# Patient Record
Sex: Male | Born: 1935 | Race: Black or African American | Hispanic: No | Marital: Married | Smoking: Former smoker
Health system: Southern US, Community
[De-identification: ages and names within clinical notes are randomized; demographics above are authoritative.]

## PROBLEM LIST (undated history)

## (undated) DIAGNOSIS — K222 Esophageal obstruction: Secondary | ICD-10-CM

## (undated) DIAGNOSIS — N182 Chronic kidney disease, stage 2 (mild): Secondary | ICD-10-CM

## (undated) DIAGNOSIS — J189 Pneumonia, unspecified organism: Secondary | ICD-10-CM

## (undated) DIAGNOSIS — R7301 Impaired fasting glucose: Secondary | ICD-10-CM

## (undated) DIAGNOSIS — I509 Heart failure, unspecified: Secondary | ICD-10-CM

## (undated) DIAGNOSIS — I739 Peripheral vascular disease, unspecified: Secondary | ICD-10-CM

## (undated) DIAGNOSIS — N400 Enlarged prostate without lower urinary tract symptoms: Secondary | ICD-10-CM

## (undated) DIAGNOSIS — E785 Hyperlipidemia, unspecified: Secondary | ICD-10-CM

## (undated) DIAGNOSIS — K219 Gastro-esophageal reflux disease without esophagitis: Secondary | ICD-10-CM

## (undated) DIAGNOSIS — I779 Disorder of arteries and arterioles, unspecified: Secondary | ICD-10-CM

## (undated) DIAGNOSIS — R9430 Abnormal result of cardiovascular function study, unspecified: Secondary | ICD-10-CM

## (undated) DIAGNOSIS — I10 Essential (primary) hypertension: Secondary | ICD-10-CM

## (undated) DIAGNOSIS — I214 Non-ST elevation (NSTEMI) myocardial infarction: Secondary | ICD-10-CM

## (undated) HISTORY — DX: Hyperlipidemia, unspecified: E78.5

## (undated) HISTORY — PX: OTHER SURGICAL HISTORY: SHX169

## (undated) HISTORY — DX: Heart failure, unspecified: I50.9

## (undated) HISTORY — DX: Impaired fasting glucose: R73.01

## (undated) HISTORY — PX: ESOPHAGEAL DILATION: SHX303

## (undated) HISTORY — DX: Gastro-esophageal reflux disease without esophagitis: K21.9

## (undated) HISTORY — DX: Pneumonia, unspecified organism: J18.9

## (undated) HISTORY — DX: Peripheral vascular disease, unspecified: I73.9

## (undated) HISTORY — DX: Chronic kidney disease, stage 2 (mild): N18.2

## (undated) HISTORY — DX: Abnormal result of cardiovascular function study, unspecified: R94.30

## (undated) HISTORY — PX: TONSILLECTOMY: SUR1361

## (undated) HISTORY — DX: Disorder of arteries and arterioles, unspecified: I77.9

## (undated) HISTORY — DX: Essential (primary) hypertension: I10

## (undated) HISTORY — DX: Benign prostatic hyperplasia without lower urinary tract symptoms: N40.0

## (undated) HISTORY — DX: Esophageal obstruction: K22.2

## (undated) HISTORY — PX: CAROTID ENDARTERECTOMY: SUR193

---

## 2005-02-14 HISTORY — PX: HERNIA REPAIR: SHX51

## 2006-02-14 HISTORY — PX: CATARACT EXTRACTION: SUR2

## 2006-11-24 ENCOUNTER — Emergency Department (HOSPITAL_COMMUNITY): Admission: EM | Admit: 2006-11-24 | Discharge: 2006-11-24 | Payer: Self-pay | Admitting: Family Medicine

## 2006-12-07 ENCOUNTER — Emergency Department (HOSPITAL_COMMUNITY): Admission: EM | Admit: 2006-12-07 | Discharge: 2006-12-07 | Payer: Self-pay | Admitting: Emergency Medicine

## 2007-02-02 ENCOUNTER — Encounter: Payer: Self-pay | Admitting: Family Medicine

## 2007-02-02 ENCOUNTER — Inpatient Hospital Stay (HOSPITAL_COMMUNITY): Admission: AD | Admit: 2007-02-02 | Discharge: 2007-02-02 | Payer: Self-pay | Admitting: Orthopedic Surgery

## 2007-04-26 ENCOUNTER — Emergency Department (HOSPITAL_COMMUNITY): Admission: EM | Admit: 2007-04-26 | Discharge: 2007-04-26 | Payer: Self-pay | Admitting: Family Medicine

## 2007-05-23 ENCOUNTER — Encounter: Admission: RE | Admit: 2007-05-23 | Discharge: 2007-05-23 | Payer: Self-pay | Admitting: Internal Medicine

## 2007-05-31 ENCOUNTER — Ambulatory Visit (HOSPITAL_COMMUNITY): Admission: RE | Admit: 2007-05-31 | Discharge: 2007-05-31 | Payer: Self-pay | Admitting: Internal Medicine

## 2008-04-11 ENCOUNTER — Encounter: Payer: Self-pay | Admitting: Cardiology

## 2008-09-13 ENCOUNTER — Encounter: Payer: Self-pay | Admitting: Cardiology

## 2009-03-13 ENCOUNTER — Encounter: Payer: Self-pay | Admitting: Cardiology

## 2009-04-07 ENCOUNTER — Inpatient Hospital Stay (HOSPITAL_COMMUNITY): Admission: EM | Admit: 2009-04-07 | Discharge: 2009-04-15 | Payer: Self-pay | Admitting: Emergency Medicine

## 2009-04-07 ENCOUNTER — Ambulatory Visit: Payer: Self-pay | Admitting: Cardiology

## 2009-04-07 ENCOUNTER — Ambulatory Visit: Payer: Self-pay | Admitting: Internal Medicine

## 2009-04-07 DIAGNOSIS — I509 Heart failure, unspecified: Secondary | ICD-10-CM

## 2009-04-07 HISTORY — DX: Heart failure, unspecified: I50.9

## 2009-04-07 LAB — CONVERTED CEMR LAB
Hgb A1c MFr Bld: 5.9 %
TSH: 1.122 microintl units/mL

## 2009-04-08 ENCOUNTER — Encounter: Payer: Self-pay | Admitting: Internal Medicine

## 2009-04-14 ENCOUNTER — Encounter (INDEPENDENT_AMBULATORY_CARE_PROVIDER_SITE_OTHER): Payer: Self-pay | Admitting: *Deleted

## 2009-04-14 DIAGNOSIS — D638 Anemia in other chronic diseases classified elsewhere: Secondary | ICD-10-CM | POA: Insufficient documentation

## 2009-04-15 ENCOUNTER — Encounter: Payer: Self-pay | Admitting: Internal Medicine

## 2009-04-15 DIAGNOSIS — K222 Esophageal obstruction: Secondary | ICD-10-CM | POA: Insufficient documentation

## 2009-04-28 ENCOUNTER — Ambulatory Visit: Payer: Self-pay | Admitting: Gastroenterology

## 2009-04-30 ENCOUNTER — Ambulatory Visit: Payer: Self-pay | Admitting: Internal Medicine

## 2009-04-30 ENCOUNTER — Encounter: Payer: Self-pay | Admitting: Internal Medicine

## 2009-04-30 DIAGNOSIS — N183 Chronic kidney disease, stage 3 (moderate): Secondary | ICD-10-CM

## 2009-04-30 LAB — CONVERTED CEMR LAB
BUN: 49 mg/dL
BUN: 28 mg/dL — ABNORMAL HIGH (ref 6–23)
Basophils Absolute: 0.1 10*3/uL (ref 0.0–0.1)
CO2: 24 meq/L
Chloride: 101 meq/L (ref 96–112)
Creatinine, Ser: 1.29 mg/dL (ref 0.40–1.50)
Eosinophils Relative: 3 % (ref 0–5)
Glucose, Bld: 101 mg/dL
HCT: 30 % — ABNORMAL LOW (ref 39.0–52.0)
HDL: 88 mg/dL
Lymphocytes Relative: 30 % (ref 12–46)
Lymphs Abs: 1.6 10*3/uL (ref 0.7–4.0)
Neutro Abs: 3 10*3/uL (ref 1.7–7.7)
Neutrophils Relative %: 56 % (ref 43–77)
Platelets: 306 10*3/uL (ref 150–400)
Potassium: 4 meq/L
Potassium: 5.3 meq/L (ref 3.5–5.3)
RDW: 13.6 % (ref 11.5–15.5)
Sodium: 140 meq/L
Total CHOL/HDL Ratio: 1.8
WBC: 5.4 10*3/uL (ref 4.0–10.5)

## 2009-05-05 ENCOUNTER — Encounter: Payer: Self-pay | Admitting: Internal Medicine

## 2009-05-12 ENCOUNTER — Ambulatory Visit: Payer: Self-pay | Admitting: Gastroenterology

## 2009-05-12 ENCOUNTER — Ambulatory Visit (HOSPITAL_COMMUNITY): Admission: RE | Admit: 2009-05-12 | Discharge: 2009-05-12 | Payer: Self-pay | Admitting: Gastroenterology

## 2009-05-14 ENCOUNTER — Ambulatory Visit: Payer: Self-pay | Admitting: Internal Medicine

## 2009-05-14 LAB — CONVERTED CEMR LAB

## 2009-05-15 LAB — CONVERTED CEMR LAB
CO2: 29 meq/L (ref 19–32)
Chloride: 100 meq/L (ref 96–112)
HCT: 29.3 % — ABNORMAL LOW (ref 39.0–52.0)
Lymphocytes Relative: 30 % (ref 12–46)
Lymphs Abs: 2 10*3/uL (ref 0.7–4.0)
Monocytes Relative: 8 % (ref 3–12)
Neutrophils Relative %: 60 % (ref 43–77)
Platelets: 189 10*3/uL (ref 150–400)
Potassium: 4.6 meq/L (ref 3.5–5.3)
RBC: 3.05 M/uL — ABNORMAL LOW (ref 4.22–5.81)
WBC: 6.6 10*3/uL (ref 4.0–10.5)

## 2009-05-30 ENCOUNTER — Encounter: Payer: Self-pay | Admitting: Cardiology

## 2009-06-01 ENCOUNTER — Ambulatory Visit: Payer: Self-pay | Admitting: Cardiology

## 2009-06-04 ENCOUNTER — Telehealth (INDEPENDENT_AMBULATORY_CARE_PROVIDER_SITE_OTHER): Payer: Self-pay | Admitting: *Deleted

## 2009-06-08 ENCOUNTER — Ambulatory Visit: Payer: Self-pay | Admitting: Cardiology

## 2009-06-08 ENCOUNTER — Encounter (HOSPITAL_COMMUNITY): Admission: RE | Admit: 2009-06-08 | Discharge: 2009-08-18 | Payer: Self-pay | Admitting: Cardiology

## 2009-06-08 ENCOUNTER — Ambulatory Visit: Payer: Self-pay

## 2009-06-12 ENCOUNTER — Telehealth: Payer: Self-pay | Admitting: Cardiology

## 2009-06-22 ENCOUNTER — Ambulatory Visit: Payer: Self-pay | Admitting: Gastroenterology

## 2009-06-23 ENCOUNTER — Telehealth: Payer: Self-pay | Admitting: Gastroenterology

## 2009-06-30 ENCOUNTER — Ambulatory Visit (HOSPITAL_COMMUNITY)
Admission: RE | Admit: 2009-06-30 | Discharge: 2009-06-30 | Payer: Self-pay | Source: Home / Self Care | Admitting: Gastroenterology

## 2009-07-03 ENCOUNTER — Ambulatory Visit (HOSPITAL_COMMUNITY)
Admission: RE | Admit: 2009-07-03 | Discharge: 2009-07-03 | Payer: Self-pay | Source: Home / Self Care | Admitting: Gastroenterology

## 2009-07-03 ENCOUNTER — Ambulatory Visit: Payer: Self-pay | Admitting: Gastroenterology

## 2009-07-10 ENCOUNTER — Ambulatory Visit: Payer: Self-pay | Admitting: Cardiology

## 2009-07-14 ENCOUNTER — Telehealth: Payer: Self-pay | Admitting: Cardiology

## 2009-07-15 ENCOUNTER — Ambulatory Visit: Payer: Self-pay | Admitting: Internal Medicine

## 2009-07-15 DIAGNOSIS — R7301 Impaired fasting glucose: Secondary | ICD-10-CM | POA: Insufficient documentation

## 2009-07-15 DIAGNOSIS — N400 Enlarged prostate without lower urinary tract symptoms: Secondary | ICD-10-CM

## 2009-07-15 DIAGNOSIS — M109 Gout, unspecified: Secondary | ICD-10-CM

## 2009-07-16 LAB — CONVERTED CEMR LAB
ALT: 9 units/L (ref 0–53)
Albumin: 3.9 g/dL (ref 3.5–5.2)
BUN: 27 mg/dL — ABNORMAL HIGH (ref 6–23)
Basophils Absolute: 0 10*3/uL (ref 0.0–0.1)
Cholesterol: 164 mg/dL (ref 0–200)
GFR calc non Af Amer: 85.74 mL/min (ref >60)
HDL: 54 mg/dL (ref >39.00)
Hemoglobin: 9.7 g/dL — ABNORMAL LOW (ref 13.0–17.0)
Hgb A1c MFr Bld: 5.8 % (ref 4.6–6.5)
Lymphocytes Relative: 34.9 % (ref 12.0–46.0)
Monocytes Relative: 9.4 % (ref 3.0–12.0)
Neutro Abs: 2.6 10*3/uL (ref 1.4–7.7)
Phosphorus: 4.2 mg/dL (ref 2.3–4.6)
Potassium: 5 meq/L (ref 3.5–5.1)
RBC: 2.93 M/uL — ABNORMAL LOW (ref 4.22–5.81)
RDW: 14.3 % (ref 11.5–14.6)
Sodium: 140 meq/L (ref 135–145)
TSH: 0.92 microintl units/mL (ref 0.35–5.50)
Total Protein: 6.9 g/dL (ref 6.0–8.3)
Triglycerides: 194 mg/dL — ABNORMAL HIGH (ref 0.0–149.0)
VLDL: 38.8 mg/dL (ref 0.0–40.0)
WBC: 4.9 10*3/uL (ref 4.5–10.5)

## 2009-09-03 ENCOUNTER — Encounter: Payer: Self-pay | Admitting: Cardiology

## 2009-09-03 ENCOUNTER — Ambulatory Visit: Payer: Self-pay | Admitting: Vascular Surgery

## 2009-12-18 ENCOUNTER — Encounter: Payer: Self-pay | Admitting: Cardiology

## 2009-12-21 ENCOUNTER — Ambulatory Visit: Payer: Self-pay | Admitting: Cardiology

## 2010-01-19 ENCOUNTER — Ambulatory Visit: Payer: Self-pay | Admitting: Internal Medicine

## 2010-01-21 LAB — CONVERTED CEMR LAB
Albumin: 4.3 g/dL (ref 3.5–5.2)
Basophils Relative: 0.7 % (ref 0.0–3.0)
Chloride: 103 meq/L (ref 96–112)
GFR calc non Af Amer: 65.34 mL/min (ref >60.00)
Hemoglobin: 10.5 g/dL — ABNORMAL LOW (ref 13.0–17.0)
Lymphocytes Relative: 37.4 % (ref 12.0–46.0)
Monocytes Relative: 9.1 % (ref 3.0–12.0)
Neutro Abs: 2.7 10*3/uL (ref 1.4–7.7)
Phosphorus: 3.9 mg/dL (ref 2.3–4.6)
Potassium: 4.9 meq/L (ref 3.5–5.1)
RBC: 3.34 M/uL — ABNORMAL LOW (ref 4.22–5.81)

## 2010-03-03 ENCOUNTER — Ambulatory Visit: Admit: 2010-03-03 | Payer: Self-pay | Admitting: Vascular Surgery

## 2010-03-03 ENCOUNTER — Ambulatory Visit
Admission: RE | Admit: 2010-03-03 | Discharge: 2010-03-03 | Payer: Self-pay | Source: Home / Self Care | Attending: Vascular Surgery | Admitting: Vascular Surgery

## 2010-03-08 ENCOUNTER — Encounter: Payer: Self-pay | Admitting: Internal Medicine

## 2010-03-09 ENCOUNTER — Inpatient Hospital Stay (HOSPITAL_COMMUNITY)
Admission: RE | Admit: 2010-03-09 | Discharge: 2010-03-10 | Payer: Self-pay | Source: Home / Self Care | Attending: Vascular Surgery | Admitting: Vascular Surgery

## 2010-03-09 ENCOUNTER — Encounter: Payer: Self-pay | Admitting: Vascular Surgery

## 2010-03-09 LAB — TYPE AND SCREEN: ABO/RH(D): O POS

## 2010-03-09 LAB — CBC
HCT: 30.9 % — ABNORMAL LOW (ref 39.0–52.0)
MCHC: 33 g/dL (ref 30.0–36.0)
Platelets: 179 10*3/uL (ref 150–400)
RDW: 13.7 % (ref 11.5–15.5)

## 2010-03-09 LAB — COMPREHENSIVE METABOLIC PANEL
Albumin: 4.1 g/dL (ref 3.5–5.2)
CO2: 30 mEq/L (ref 19–32)
Creatinine, Ser: 1.37 mg/dL (ref 0.4–1.5)
Glucose, Bld: 99 mg/dL (ref 70–99)
Sodium: 140 mEq/L (ref 135–145)
Total Bilirubin: 0.6 mg/dL (ref 0.3–1.2)
Total Protein: 7.1 g/dL (ref 6.0–8.3)

## 2010-03-09 LAB — URINALYSIS, ROUTINE W REFLEX MICROSCOPIC
Bilirubin Urine: NEGATIVE
Ketones, ur: NEGATIVE mg/dL
Nitrite: NEGATIVE
Protein, ur: NEGATIVE mg/dL
pH: 6 (ref 5.0–8.0)

## 2010-03-09 LAB — PROTIME-INR: INR: 1.18 (ref 0.00–1.49)

## 2010-03-09 LAB — SURGICAL PCR SCREEN: MRSA, PCR: POSITIVE — AB

## 2010-03-10 LAB — GLUCOSE, CAPILLARY: Glucose-Capillary: 151 mg/dL — ABNORMAL HIGH (ref 70–99)

## 2010-03-10 LAB — CBC
HCT: 23.9 % — ABNORMAL LOW (ref 39.0–52.0)
Hemoglobin: 7.9 g/dL — ABNORMAL LOW (ref 13.0–17.0)
MCH: 30 pg (ref 26.0–34.0)
MCHC: 33.1 g/dL (ref 30.0–36.0)
MCV: 90.9 fL (ref 78.0–100.0)
Platelets: 127 K/uL — ABNORMAL LOW (ref 150–400)
RBC: 2.63 MIL/uL — ABNORMAL LOW (ref 4.22–5.81)
RDW: 13.9 % (ref 11.5–15.5)
WBC: 5.9 K/uL (ref 4.0–10.5)

## 2010-03-10 LAB — BASIC METABOLIC PANEL WITH GFR
BUN: 23 mg/dL (ref 6–23)
CO2: 26 meq/L (ref 19–32)
Calcium: 8.7 mg/dL (ref 8.4–10.5)
Chloride: 104 meq/L (ref 96–112)
Creatinine, Ser: 1.32 mg/dL (ref 0.4–1.5)
GFR calc non Af Amer: 52 mL/min — ABNORMAL LOW
Glucose, Bld: 122 mg/dL — ABNORMAL HIGH (ref 70–99)
Potassium: 4.4 meq/L (ref 3.5–5.1)
Sodium: 137 meq/L (ref 135–145)

## 2010-03-11 NOTE — Op Note (Addendum)
NAME:  KAIKOA, MAGRO NO.:  [id number]  MEDICAL RECORD NO.:  [id number]          PATIENT TYPE:  INP  LOCATION:  2899                         FACILITY:  [facility]  PHYSICIAN:  Di Kindle. Edilia Bo, M.D.DATE OF BIRTH:  [birth year]  DATE OF PROCEDURE:  03/09/2010 DATE OF DISCHARGE:                              OPERATIVE REPORT   PREOPERATIVE DIAGNOSIS:  Greater than 80% left carotid stenosis, asymptomatic.  POSTOPERATIVE DIAGNOSIS:  Greater than 80% left carotid stenosis, asymptomatic.  PROCEDURE:  Left carotid endarterectomy with Dacron patch angioplasty.  SURGEON:  Di Kindle. Edilia Bo, M.D.  ASSISTANT:  Della Goo, PA-C.  ANESTHESIA:  General.  INDICATIONS:  This is an 77 year old gentleman whom I have been following with a moderate left carotid stenosis and a known right internal carotid artery occlusion.  The carotid stenosis on the left progressed to greater than 80%, and left carotid endarterectomy was recommended in order to lower his risk of future stroke.  FINDINGS:  90% stenosis with focal calcific plaque.  TECHNIQUE:  The patient was taken to the operating room and received a general anesthetic.  Arterial line had been placed by Anesthesia.  The left neck was prepped and draped in usual sterile fashion.  Of note, he had very limited neck mobility, and it was difficult to turn his head. The incision was made along the anterior border of the sternocleidomastoid and dissection carried down to the common carotid artery which was dissected free and controlled with Rumel tourniquet. Facial vein was divided between 2-0 silk ties, and then the internal carotid artery was controlled above the plaque and the external carotid artery was controlled with a blue vessel loop.  The patient was then heparinized.  Clamps were then placed on the internal, then the external, then the common carotid artery.  A longitudinal arteriotomy was made in the common  carotid artery, and this was extended through the plaque into the internal carotid artery above the plaque.  A 12-shunt was placed into the internal carotid artery, back bled and then placed in the common carotid artery and secured with Rumel tourniquet.  Flow was reestablished with a shunt.  An endarterectomy plane was established proximally, and the plaque was sharply divided.  Eversion endarterectomy was performed of the external carotid artery.  Distally, there was a nice taper in the plaque and no tacking sutures were required.  The artery was irrigated with copious amounts of heparin and dextran, and all loose debris was removed.  The Dacron patch was then sewn using continuous 6-0 Prolene suture.  Prior to completing the patch closure, the shunt was removed.  The artery was back bled and flushed appropriately.  The anastomosis was completed and flow reestablished, first to the external carotid artery and into the internal carotid artery.  At the completion, there was a good Doppler signal distal to the patch and a good flow.  Hemostasis was obtained in the wound.  The wound was closed with a deep layer of 3-0 Vicryl.  The heparin was partially reversed with protamine. The platysma was closed with running 3-0 Vicryl.  The skin was closed with a  4-0 subcuticular stitch.  Sterile dressing was applied.  The patient tolerated the procedure well and was transferred to the recovery room in stable condition.  All needle and sponge counts were correct.     Di Kindle. Edilia Bo, M.D.     CSD/MEDQ  D:  03/09/2010  T:  03/10/2010  Job:  [id number]  cc:   Luis Abed, MD, [facility]  Electronically Signed by Waverly Ferrari M.D. on 03/11/2010 04:43:33 PM

## 2010-03-11 NOTE — Discharge Summary (Addendum)
NAME:  SELIG, Anthony Werner NO.:  [id number]  MEDICAL RECORD NO.:  [id number]          PATIENT TYPE:  INP  LOCATION:  3301                         FACILITY:  [facility]  PHYSICIAN:  Di Kindle. Edilia Bo, M.D.DATE OF BIRTH:  10-11-1924  DATE OF ADMISSION:  03/09/2010 DATE OF DISCHARGE:  03/10/2010                              DISCHARGE SUMMARY   ADMIT DIAGNOSIS:  Asymptomatic left internal carotid artery stenosis.  PAST MEDICAL HISTORY AND DISCHARGE DIAGNOSES: 1. Left internal carotid artery stenosis, status post left carotid     endarterectomy. 2. Hypertension. 3. Hypercholesterolemia. 4. Congestive heart failure. 5. Status post bilateral inguinal hernia repair. 6. Esophageal dilatation.  ALLERGIES: 1. ATENOLOL. 2. DILTIAZEM. 3. IRON. 4. HYDROCHLOROTHIAZIDE. 5. LABETALOL. 6. NIACIN. 7. TERAZOSIN. 8. STATIN.  BRIEF HISTORY:  The patient is an 77 year old male who has been followed by Dr. Edilia Bo as an outpatient for known carotid stenosis.  On his most recent study on March 03, 2010, the left carotid stenosis had progressed to greater than 80%.  Therefore, left carotid endarterectomy was recommended to reduce stroke risk.  HOSPITAL COURSE:  The patient was admitted and taken to the OR on March 09, 2010, for left carotid endarterectomy with Dacron patch angioplasty.  The patient tolerated the procedure well and was  hemodynamically stable immediately postoperatively.  The patient was transferred from the OR to postanesthesia care unit in stable condition. The patient was extubated without complication and woke up from anesthesia neurologically intact.  The patient's postoperative course has progressed as expected.  On postoperative day 1, he is without complaint.  He is ambulating without difficulty and tolerating a regular diet.  He is afebrile with stable vital signs.  PHYSICAL EXAMINATION:  CARDIAC:  Regular rate and rhythm. LUNGS:  Clear to  auscultation.  His incision is clean, dry and intact and he is neurologically intact.    The patient is doing well.  He does have an acute blood loss anemia, which is asymptomatic,  but we will needto be followed up as an outpatient.  As long as the patient is able to void without difficulty, he will be ready for discharge home today.  LABORATORY DATA:  CBC and BMP on March 10, 2010, white count 5.9, hemoglobin 7.9, hematocrit 23.9, and platelets 127.  Sodium 137, potassium 4.4, BUN 23, and creatinine 1.32.  DISCHARGE MEDICATIONS: 1. Percocet 5/325 mg 1-2 every 4-6 hours p.r.n. pain. 2. Aspirin 81 mg daily. 3. Carvedilol 6.25 mg daily. 4. Furosemide 40 mg daily. 5. Lisinopril 5 mg daily. 6. Omeprazole 20 mg b.i.d. 7. Plavix 75 mg daily. 8. Pravachol 40 mg daily. 9. Vitamin C 500 mg daily. 10.Vitamin D 2-4 units daily. 11.Vitamin B complex OTC daily.  DISCHARGE INSTRUCTIONS:  The patient received specific written discharge instructions including that he may shower starting tomorrow, clean the incision with soap  and water and keep dry.  He should not lift anything over 10 pounds for 6 weeks and no driving for 2 weeks.  If the patient has any difficulty swallowing or speaking, or weakness in his extremities that is new, or severe headache he should contact  the office.  If he has increased swelling in the neck and/or difficulty breathing he should call 911.  The patient has a followup appointment with Dr. Edilia Bo in 2 weeks.  The patient will be contacted with the date and time of that appointment.  Any questions should be directed to our office at [phone].     Pecola Leisure, PA   ______________________________ Di Kindle. Edilia Bo, M.D.    AY/MEDQ  D:  03/10/2010  T:  03/11/2010  Job:  [id number]  Electronically Signed by Pecola Leisure PA on 03/11/2010 01:38:50 PM Electronically Signed by Waverly Ferrari M.D. on 03/11/2010 04:44:39 PM

## 2010-03-12 NOTE — Procedures (Unsigned)
CAROTID DUPLEX EXAM  INDICATION:  Follow up carotid artery disease, known right ICA occlusion.  HISTORY: Diabetes:  No. Cardiac:  No. Hypertension:  Yes. Smoking:  Previously. Previous Surgery:  No. CV History:  Asymptomatic. Amaurosis Fugax No, Paresthesias No, Hemiparesis No.                                      RIGHT             LEFT Brachial systolic pressure: Brachial Doppler waveforms: Vertebral direction of flow:                          Antegrade DUPLEX VELOCITIES (cm/sec) CCA peak systolic                                     95 ECA peak systolic                                     222 ICA peak systolic                                     458 ICA end diastolic                                     118 PLAQUE MORPHOLOGY:                                    Mixed PLAQUE AMOUNT:                                        Severe PLAQUE LOCATION:                                      ICA, bifurcation, ECA  IMPRESSION: 1. Right internal carotid artery known occlusion. 2. Left internal carotid artery velocities suggest 80% to 99%     stenosis.  ___________________________________________ Di Kindle. Edilia Bo, M.D.  EM/MEDQ  D:  03/03/2010  T:  03/03/2010  Job:  [id number]

## 2010-03-16 NOTE — Assessment & Plan Note (Signed)
Summary: Cardiology Nuclear Study  Nuclear Med Background Indications for Stress Test: Evaluation for Ischemia, Post Hospital  Indications Comments: 04/07/09 [facility]: DOE, CHF; Pneumonia and increase Troponins  History: Echo, Emphysema, Heart Catheterization  History Comments: '75 Cath:([place]) OK per patient; 2/11 Echo:EF=45%;  Symptoms: Chest Tightness, Chest Tightness with Exertion, DOE, Palpitations, Rapid HR  Symptoms Comments: Last episode of WG:NFAO week.   Nuclear Pre-Procedure Cardiac Risk Factors: Carotid Disease, Family History - CAD, History of Smoking, Hypertension, Lipids Caffeine/Decaff Intake: None NPO After: 6:00 PM Lungs: Clear.  O2 Sat 98% on RA. IV 0.9% NS with Angio Cath: 20g     IV Site: (R) AC IV Started by: Irean Hong RN Chest Size (in) 40     Height (in): 67 Weight (lb): 127 BMI: 19.96 Tech Comments: Carvedilol held x 24 hours.  Nuclear Med Study 1 or 2 day study:  1 day     Stress Test Type:  Eugenie Birks Reading MD:  Olga Millers, MD     Referring MD:  Willa Rough, MD Resting Radionuclide:  Technetium 43m Tetrofosmin     Resting Radionuclide Dose:  11.0 mCi  Stress Radionuclide:  Technetium 47m Tetrofosmin     Stress Radionuclide Dose:  33.0 mCi   Stress Protocol   Lexiscan: 0.4 mg   Stress Test Technologist:  Rea College CMA-N     Nuclear Technologist:  Domenic Polite CNMT  Rest Procedure  Myocardial perfusion imaging was performed at rest 45 minutes following the intravenous administration of Myoview Technetium 37m Tetrofosmin.  Stress Procedure  The patient received IV Lexiscan 0.4 mg over 15-seconds.  Myoview injected at 30-seconds.  There were no significant changes with lexiscan; occasional PAC's, rare PVC's.  Quantitative spect images were obtained after a 45 minute delay.  QPS Raw Data Images:  There is interference from nuclear activity from structures below the diaphragm.  This does not affect the ability to read the  study. Stress Images:  There is decreased uptake in the inferior wall. Rest Images:  There is decreased uptake in the inferior wall. Subtraction (SDS):  No evidence of ischemia. Transient Ischemic Dilatation:  1.0  (Normal <1.22)  Lung/Heart Ratio:  .32  (Normal <0.45)  Quantitative Gated Spect Images QGS EDV:  98 ml QGS ESV:  33 ml QGS EF:  66 % QGS cine images:  Normal wall motion.   Overall Impression  Exercise Capacity: Lexiscan study with no exercise. BP Response: Normal blood pressure response. Clinical Symptoms: No chest pain ECG Impression: No significant ST segment change suggestive of ischemia. Overall Impression: There is inferior thinning but no sign of scar or ischemia.  Appended Document: Cardiology Nuclear Study This looks good  Appended Document: Cardiology Nuclear Study Left message to call back   Appended Document: Cardiology Nuclear Study pts wife aware

## 2010-03-16 NOTE — Assessment & Plan Note (Signed)
Summary: 2WK F/U/DEVANI/VS   Vital Signs:  Patient profile:   77 year old male Height:      67 inches (170.18 cm) Weight:      127.1 pounds (57.45 kg) BMI:     19.98 Temp:     97.5 degrees F (36.39 degrees C) oral Pulse rate:   60 / minute BP sitting:   136 / 56  (right arm) Cuff size:   regular  Vitals Entered By: Theotis Barrio NT II (May 14, 2009 9:33 AM) CC: PATIENT IS HERE FOR 2 WEEK FOLLOW UP APPT  / MEDICATIONS  REFILL - 90 DAY SUPPLY Is Patient Diabetic? No Pain Assessment Patient in pain? no      Nutritional Status BMI of 19 -24 = normal  Have you ever been in a relationship where you felt threatened, hurt or afraid?No   Does patient need assistance? Functional Status Self care Ambulation Normal Comments MEDICATIONS REFILL - 90 DAY SUPPLY   Primary Care Provider:  [facility]  CC:  PATIENT IS HERE FOR 2 WEEK FOLLOW UP APPT  / MEDICATIONS  REFILL - 90 DAY SUPPLY.  History of Present Illness: In here for follow up and med refills. Mr. Belcher was seen two weeks ago and was noted to be anemic (likely chronic). Had a colonoscopy with in the past 3 year or so at [place] that was "normal". Had an EGD last week, found to have esoph stricture. No other complaints.   Preventive Screening-Counseling & Management  Alcohol-Tobacco     Smoking Status: quit  Caffeine-Diet-Exercise     Does Patient Exercise: yes     Type of exercise: TRED MILL     Exercise (avg: min/session): 15-20 MIN     Times/week: 3  Current Medications (verified): 1)  Omeprazole 20 Mg Cpdr (Omeprazole) .... Take 1 Tablet By Mouth Once A Day 2)  Plavix 75 Mg Tabs (Clopidogrel Bisulfate) .... Take 1 Tablet By Mouth Once A Day 3)  Furosemide 40 Mg Tabs (Furosemide) .... Take 1 Tablet By Mouth Once A Day 4)  Aspir-Low 81 Mg Tbec (Aspirin) .... Take 1 Tablet By Mouth Once A Day 5)  Lisinopril 5 Mg Tabs (Lisinopril) .... Take 1 Tablet By Mouth Once A Day 6)  Pravastatin Sodium 40 Mg Tabs (Pravastatin  Sodium) .... Take 1 Tablet By Mouth Once A Day 7)  Carvedilol 6.25 Mg Tabs (Carvedilol) .... Take 1 Tablet By Mouth Two Times A Day  Allergies (verified): 1)  ! Atenolol 2)  ! * Diltiazem 3)  ! Iron 4)  ! Hydrochlorothiazide 5)  ! * Labetalol 6)  ! Niacin 7)  ! * Omeprazole 8)  ! * Terazosin 9)  ! * Feladipine 10)  ! * Flurastatin 11)  ! Lovastatin 12)  ! Simvastatin  Past History:  Past Medical History: Last updated: 04/23/2009 Community-acquired pneumonia Hypertension Hyperlipidemia benign prostatic hyperplasia anemia of chronic disease CHF Esophageal stricture Stage III  chronic disease  Past Surgical History: Last updated: 04/28/2009 Cataract Extraction Hernia Surgery Tonsillectomy  Family History: Last updated: 04/28/2009 Family History of Prostate Cancer:Father Family History of Heart Disease:Brothers x2  No FH of Colon Cancer:  Social History: Last updated: 04/28/2009 Married  Tobacco Use - Former. -- 28 Occupation: Retired Doctor, hospital Alcohol Use - no Illicit Drug Use - no  Risk Factors: Exercise: yes (05/14/2009)  Risk Factors: Smoking Status: quit (05/14/2009)  Social History: Does Patient Exercise:  yes  Review of Systems      See  HPI  Physical Exam  General:  alert and well-developed.   Eyes:  pupils round and pupils reactive to light.   Mouth:  pharynx pink and moist.   Neck:  supple.   Lungs:  normal respiratory effort and normal breath sounds.   Heart:  normal rate and regular rhythm.   Abdomen:  soft and non-tender.   Pulses:  normal peripheral pulses  Extremities:  no cyanosis, clubbing or edema  Neurologic:  non focal.   Impression & Recommendations:  Problem # 1:  ESSENTIAL HYPERTENSION, BENIGN (ICD-401.1) BP borderline, will increase lisinopril to 5 mg daily. Check b-met today.   His updated medication list for this problem includes:    Furosemide 40 Mg Tabs (Furosemide) .Marland Kitchen... Take 1 tablet by mouth once a day     Lisinopril 5 Mg Tabs (Lisinopril) .Marland Kitchen... Take 1 tablet by mouth once a day    Carvedilol 6.25 Mg Tabs (Carvedilol) .Marland Kitchen... Take 1 tablet by mouth two times a day  Orders: T-Basic Metabolic Panel [id number]) T-CBC w/Diff [phone])  Problem # 2:  ANEMIA OF OTHER CHRONIC DISEASE (ICD-285.29) This could be chronic, but will need to review colonoscopy results from [place]. I will also get a ferritin today.   Orders: T-Basic Metabolic Panel [id number]) T-CBC w/Diff [phone] [id number]) T-Ferritin [phone])  Problem # 3:  HYPERLIPIDEMIA (ICD-272.4) Well-controlled. Continue the current regimen.  Chol: 160 (04/30/2009 1:37:40 PM)HDL:  88 (04/30/2009 1:37:40 PM)LDL:  63 (04/30/2009 1:37:40 PM)Tri:   AST:   ALT:  T. Bili:   AP:     His updated medication list for this problem includes:    Pravastatin Sodium 40 Mg Tabs (Pravastatin sodium) .Marland Kitchen... Take 1 tablet by mouth once a day  Complete Medication List: 1)  Omeprazole 20 Mg Cpdr (Omeprazole) .... Take 1 tablet by mouth once a day 2)  Plavix 75 Mg Tabs (Clopidogrel bisulfate) .... Take 1 tablet by mouth once a day 3)  Furosemide 40 Mg Tabs (Furosemide) .... Take 1 tablet by mouth once a day 4)  Aspir-low 81 Mg Tbec (Aspirin) .... Take 1 tablet by mouth once a day 5)  Lisinopril 5 Mg Tabs (Lisinopril) .... Take 1 tablet by mouth once a day 6)  Pravastatin Sodium 40 Mg Tabs (Pravastatin sodium) .... Take 1 tablet by mouth once a day 7)  Carvedilol 6.25 Mg Tabs (Carvedilol) .... Take 1 tablet by mouth two times a day  Patient Instructions: 1)  Please schedule a follow-up appointment in 1-2  month. 2)  We will let you know if anything wrong with your lab work.   Prescriptions: CARVEDILOL 6.25 MG TABS (CARVEDILOL) Take 1 tablet by mouth two times a day  #90 x 3   Entered and Authorized by:   Zara Council MD   Signed by:   Zara Council MD on 05/14/2009   Method used:   Electronically to        [facility] [phone]* (retail)        [address]       [place], [place]  [address]       Ph: [phone]       Fax: [phone]   RxID:   [id number] PRAVASTATIN SODIUM 40 MG TABS (PRAVASTATIN SODIUM) Take 1 tablet by mouth once a day  #90 x 3   Entered and Authorized by:   Zara Council MD   Signed by:   Zara Council MD on 05/14/2009   Method used:   Electronically to  [facility] [address] [phone]* (retail)       [address]       [place], [place]  [address]       Ph: [phone]       Fax: [phone]   RxID:   [id number] LISINOPRIL 5 MG TABS (LISINOPRIL) Take 1 tablet by mouth once a day  #90 x 3   Entered and Authorized by:   Zara Council MD   Signed by:   Zara Council MD on 05/14/2009   Method used:   Electronically to        [facility] [phone]* (retail)       [address]       [place], [place]  [address]       Ph: [phone]       Fax: [phone]   RxID:   [id number] FUROSEMIDE 40 MG TABS (FUROSEMIDE) Take 1 tablet by mouth once a day  #90 x 3   Entered and Authorized by:   Zara Council MD   Signed by:   Zara Council MD on 05/14/2009   Method used:   Electronically to        [facility] [phone]* (retail)       [address]       [place], [place]  [address]       Ph: [phone]       Fax: [phone]   RxID:   [id number] PLAVIX 75 MG TABS (CLOPIDOGREL BISULFATE) Take 1 tablet by mouth once a day  #90 x 3   Entered and Authorized by:   Zara Council MD   Signed by:   Zara Council MD on 05/14/2009   Method used:   Electronically to        [facility] [phone]* (retail)       [address]       [place], [place]  [address]       Ph: [phone]       Fax: [phone]   RxID:   [id number] OMEPRAZOLE 20 MG CPDR (OMEPRAZOLE) Take 1 tablet by mouth once a day  #90 x 3   Entered and Authorized by:   Zara Council MD   Signed by:   Zara Council MD on 05/14/2009   Method used:   Electronically to        [id number]* (retail)        [address]       [place], [place]  [address]       Ph: [phone]       Fax: [phone]   RxID:   [id number]   Prevention & Chronic Care Immunizations   Influenza vaccine: Not documented   Influenza vaccine deferral: Deferred  (05/14/2009)   Influenza vaccine due: 10/16/2010    Tetanus booster: Not documented   Td booster deferral: Deferred  (04/30/2009)    Pneumococcal vaccine: Pneumovax  (04/30/2009)    H. zoster vaccine: Not documented   H. zoster vaccine deferral: Refused  (04/30/2009)  Colorectal Screening   Hemoccult: Not documented   Hemoccult action/deferral: Deferred  (04/30/2009)    Colonoscopy: Not documented   Colonoscopy action/deferral: Deferred  (05/14/2009)  Other Screening   PSA: Not documented   PSA action/deferral: Discussion deferred  (05/14/2009)   Smoking status: quit  (05/14/2009)  Lipids   Total Cholesterol: 160  (04/30/2009)   Lipid panel action/deferral: Deferred   LDL: 63  (04/30/2009)   LDL Direct: Not documented  HDL: 88  (04/30/2009)   Triglycerides: 44  (04/30/2009)    SGOT (AST): Not documented   BMP action: Deferred   SGPT (ALT): Not documented   Alkaline phosphatase: Not documented   Total bilirubin: Not documented    Lipid flowsheet reviewed?: Yes   Progress toward LDL goal: At goal  Hypertension   Last Blood Pressure: 136 / 56  (05/14/2009)   Serum creatinine: 1.29  (04/30/2009)   Serum potassium 5.3  (04/30/2009)    Hypertension flowsheet reviewed?: Yes   Progress toward BP goal: Unchanged  Self-Management Support :   Personal Goals (by the next clinic visit) :      Personal blood pressure goal: 130/80  (04/30/2009)     Personal LDL goal: 100  (04/30/2009)    Patient will work on the following items until the next clinic visit to reach self-care goals:     Medications and monitoring: take my medicines every day, bring all of my medications to every visit  (05/14/2009)     Eating: drink diet soda or water  instead of juice or soda, eat more vegetables, use fresh or frozen vegetables, eat foods that are low in salt, eat baked foods instead of fried foods, eat fruit for snacks and desserts, limit or avoid alcohol  (05/14/2009)     Activity: take a 30 minute walk every day  (05/14/2009)    Hypertension self-management support: Written self-care plan  (04/30/2009)    Lipid self-management support: Written self-care plan  (04/30/2009)   Process Orders Check Orders Results:     [facility]: Check successful Tests Sent for requisitioning (May 14, 2009 10:04 AM):     05/14/2009: [facility] -- T-Basic Metabolic Panel [phone] (signed)     05/14/2009: [facility] -- T-CBC w/Diff [[id number]] (signed)     05/14/2009: [facility] -- T-Ferritin [phone] (signed)

## 2010-03-16 NOTE — Progress Notes (Signed)
Summary: pt still dose not have refills  Phone Note Call from Patient Call back at Home Phone [phone]   Caller: Spouse Reason for Call: Refill Medication, Talk to Nurse, Talk to Doctor Summary of Call: They have checked with the Pharm about refills and was told we didn't send them in please call patient and let her know what you find out Initial call taken by: Omer Jack,  Jul 14, 2009 10:05 AM  Follow-up for Phone Call        spoke w/pts wife needs [facility] locally [facility], rx called in [place], RN  Jul 14, 2009 10:49 AM

## 2010-03-16 NOTE — Assessment & Plan Note (Signed)
Summary: per check out/sf   Visit Type:  Follow-up Referring Provider:  Doneen Poisson, MD Primary Provider:  [facility]  CC:  chest pain.  History of Present Illness: The patient is seen for followup of chest pain, CHF, left ventricular dysfunction, carotid artery disease.  He has an ejection fraction of 45%.  During recent hospitalization he had had some slight troponin elevation with CHF.  This occurred also in the setting of a pneumonia.  I saw him in the office on June 01 2009.  He was stable at that time.  Was felt we should be sure that he was not having any marked ischemia after his hospitalization with elevated troponin.  Myoview scan was done dated June 08, 2009.  Study showed no significant ischemia and no significant scar.  Patient returns today for followup.  I have asked him to obtain records from the [place].  He brought approximately 50 pages for review.  I have reviewed many of the records but I will review further after this visit.  Current Medications (verified): 1)  Omeprazole 20 Mg Cpdr (Omeprazole) .... Take 1 Tablet By Mouth Once A Day 2)  Plavix 75 Mg Tabs (Clopidogrel Bisulfate) .... Take 1 Tablet By Mouth Once A Day 3)  Furosemide 40 Mg Tabs (Furosemide) .... Take 1 Tablet By Mouth Once A Day 4)  Aspir-Low 81 Mg Tbec (Aspirin) .... Take 1 Tablet By Mouth Once A Day 5)  Lisinopril 5 Mg Tabs (Lisinopril) .... Take 1 Tablet By Mouth Once A Day 6)  Pravastatin Sodium 40 Mg Tabs (Pravastatin Sodium) .... Take 1 Tablet By Mouth Once A Day 7)  Carvedilol 6.25 Mg Tabs (Carvedilol) .... Take 1 Tablet By Mouth Two Times A Day 8)  Vitamin C 500 Mg  Tabs (Ascorbic Acid) .... Once Daily 9)  Vitamin B Complex-C   Caps (B Complex-C) .... Once Daily 10)  Vitamin D 400 Unit  Tabs (Cholecalciferol) .... Once Daily  Allergies: 1)  ! Atenolol 2)  ! * Diltiazem 3)  ! Iron 4)  ! Hydrochlorothiazide 5)  ! * Labetalol 6)  ! Niacin 7)  ! * Terazosin 8)  ! * Fleodapine 9)  ! *  Fluvastatin 10)  ! Lovastatin 11)  ! Simvastatin  Past History:  Past Medical History: Last updated: 06/01/2009 Community-acquired pneumonia   hospital ....04/07/2009 Hypertension Hyperlipidemia benign prostatic hyperplasia anemia of chronic disease   Hgb 8...transfusion hospital 04/2009 CHF.Marland Kitchenwhile in hospital 04/07/2009 EF  45%...echo...04/08/2009 Esophageal stricture....dilitation at [facility] ,..over the years CKD  II carotid artery disease total right carotid, 50% left carotid by report from the patient  Review of Systems       Patient denies fever, chills, headache, sweats, rash, change in vision, change in hearing, chest pain, cough, nausea vomiting, urinary symptoms.  All other systems are reviewed and are negative.  Vital Signs:  Patient profile:   77 year old male Height:      67 inches Weight:      132.50 pounds BMI:     20.83 Pulse rate:   60 / minute Pulse rhythm:   regular Resp:     18 per minute BP sitting:   148 / 60  (left arm) Cuff size:   regular  Vitals Entered By: Vikki Ports (Jul 10, 2009 9:47 AM)  Physical Exam  General:  patient is stable today. Head:  head is atraumatic. Eyes:  no xanthelasma. Neck:  bilateral carotid bruits. Chest Wall:  no chest wall tenderness. Lungs:  lungs are clear.  Respiratory effort is not labored. Heart:  cardiac exam reveals S1 and S2.  There is a soft systolic murmur. Abdomen:  abdomen is soft. Msk:  no musculoskeletal deformities. Extremities:  no peripheral edema. Skin:  no skin rashes. Psych:  patient is oriented to person time and place.  Affect is normal.  He is here with his wife today.   Impression & Recommendations:  Problem # 1:  CHEST DISCOMFORT (ICD-786.59) The patient has not had any further chest discomfort.  Nuclear scan reveals no ischemia.  No further workup.  Problem # 2:  CAROTID ARTERY DISEASE (ICD-433.10)  The patient has severe carotid artery disease.  I have reviewed his most recent Doppler  and the report of a CT angiogram that had been done within the past year.  He has total occlusion of the right carotid.  There is significant disease of the left carotid.  The patient receives all of his care now here in [place].  I will review the records further and then help him arrange a visit with vascular surgery so that they can follow him with me.  He is stable at this time. I have reviewed approximately 100 pages of outside records.  I have selected several pages to copy and send to vascular surgery.  The patient has known total occlusion of the right carotid artery.  He has 50-70% stenosis of the left carotid artery.  There is a note from April 10, 2008 that appears to be an admission note.  I believe that the patient was tentatively scheduled for carotid endarterectomy depending on the results of the CT angiogram.  This study was done April 11 2008.  There is a report of several pages.  It is my understanding that shows complete occlusion of the right internal carotid.  There is reconstitution of the right supraclinoid internal carotid artery area there is approximate 60% stenosis of the left internal carotid artery.  It is my assumption that based on this result the patient was then followed clinically. A recent Doppler report from January 2 011 head is read as again showing total occlusion of the right carotid with greater than 70% stenosis of the left carotid.  It is my understanding that the patient was to be followed clinically based on this.  The patient is stable.  He lives in the area now and I will refer him for vascular evaluation so that he can be followed here.  A copy of this note along with copies of selected reports will be sent to vascular surgery.  Orders: VVSG Referral (VVSG Ref)  Problem # 3:  * EF  45% The patient is clinically stable and he is on appropriate medications.  No change in meds today.  Problem # 4:  ESSENTIAL HYPERTENSION, BENIGN (ICD-401.1) Blood  pressure is upper normal for his age.  I will not change his meds today.  I will plan to see him back in 6 months.  I will be arranging his vascular surgery consultation.  Patient Instructions: 1)  You have been referred to Dr Edilia Bo 2)  Your physician wants you to follow-up in: 6 months.  You will receive a reminder letter in the mail two months in advance. If you don't receive a letter, please call our office to schedule the follow-up appointment.

## 2010-03-16 NOTE — Letter (Signed)
Summary: Results Letter  [facility]  [address] [place], [place] [address]   Phone: [phone]  Fax: [phone]        April 28, 2009 MRN: [id number]    Anthony Werner [address] , [place]  [address]    Dear Anthony Werner,  It is my pleasure to have treated you recently as a new patient in my office. I appreciate your confidence and the opportunity to participate in your care.  Since I do have a busy inpatient endoscopy schedule and office schedule, my office hours vary weekly. I am, however, available for emergency calls everyday through my office. If I am not available for an urgent office appointment, another one of our gastroenterologist will be able to assist you.  My well-trained staff are prepared to help you at all times. For emergencies after office hours, a physician from our Gastroenterology section is always available through my 24 hour answering service  Once again I welcome you as a new patient and I look forward to a happy and healthy relationship             Sincerely,  Louis Meckel MD  This letter has been electronically signed by your physician.  Appended Document: Results Letter letter mailed

## 2010-03-16 NOTE — Progress Notes (Signed)
Summary: Nuclear Pre-Procedure  Phone Note Outgoing Call   Call placed by: Milana Na, EMT-P,  June 04, 2009 1:31 PM Summary of Call: .[email or web address]     Nuclear Med Background Indications for Stress Test: Evaluation for Ischemia, Post Hospital  Indications Comments: 04/07/09 [facility] DOE CHF Pneumonia and increase Troponins  History: Echo  History Comments: 02/11 ECHO EF 45%  Symptoms: Chest Tightness with Exertion, DOE    Nuclear Pre-Procedure Cardiac Risk Factors: Carotid Disease, History of Smoking, Hypertension, Lipids Height (in): 67  Nuclear Med Study Referring MD:  [employer]

## 2010-03-16 NOTE — Miscellaneous (Signed)
Summary: hospital discharge (pneumonia vs CHF)  Hospital Discharge  Date of admission: 04/07/2009  Date of discharge: 04/15/2009  Brief reason for admission/active problems: CHF and CAP  Followup needed: - Had DOE- was found to have some pumonary edema and Pl effusion with high BNP. Geot better with diuresis. Was also on Avelox for CAP due to cough and brownish bloody sputum. D/C on 5 days of avelox. follow up with persistence of cough and dyspnea - Had elevated troponins due to cardiac strain possible per cadiology. reccomended medical Mx without intervention.  - Has Eso stricture and is schedule with [facility] on 04/28/2009 for dilatation - Has anemia (AOCD per anemia panel) with baseline Hb of around 8. Recieved 1 PRBC in the hospital.  Iron supplements at home which were discontinued. Follow CBC at regular bases -Had PET scan from [facility] with some urology workup for BPH symtpoms. Have not been able to get those records along with other records from [place] at [facility]. make an effort while he is the clinic. ( Fax: [phone], Attn: Shanna Cisco) - Stage 3 CKD with baseline creatinine of 1.5. stable at discharge. Check BMET periodically as on lasix -Started on lasix and coreg and lisinopril instead of home regimen of HCTZ, Terazosin and labetolol for HTN -Had HHPT set up at hospital. Please follow on that.  The medication and problem lists have been updated.  Please see the dictated discharge summary for details.  Problems: Added new problem of ESSENTIAL HYPERTENSION, BENIGN (ICD-401.1) - Signed Added new problem of HYPERLIPIDEMIA (ICD-272.4) - Signed Added new problem of BENIGN PROSTATIC HYPERTROPHY, HX OF (ICD-V13.8) - Signed Added new problem of ANEMIA OF OTHER CHRONIC DISEASE (ICD-285.29) - Cause unknown. CRI suspected  - Signed Added new problem of ESOPHAGEAL STRICTURE (ICD-530.3) - Periodic dilatation - Signed Added new problem of CHF (ICD-428.0) - 04/2009- Dyspnea, JVD, Per  edema, elevated troponin, high BNP-- releived with lasix - Signed Medications: Added new medication of OMEPRAZOLE 20 MG CPDR (OMEPRAZOLE) Take 1 tablet by mouth once a day - Signed Added new medication of PLAVIX 75 MG TABS (CLOPIDOGREL BISULFATE) Take 1 tablet by mouth once a day - Signed Added new medication of FUROSEMIDE 40 MG TABS (FUROSEMIDE) Take 1 tablet by mouth once a day - Signed Added new medication of ASPIR-LOW 81 MG TBEC (ASPIRIN) Take 1 tablet by mouth once a day - Signed Added new medication of LISINOPRIL 2.5 MG TABS (LISINOPRIL) Take 1 tablet by mouth two times a day - Signed Added new medication of AVELOX 400 MG TABS (MOXIFLOXACIN HCL) Take 1 tablet by mouth once a day for 5 days - Signed Added new medication of PRAVASTATIN SODIUM 40 MG TABS (PRAVASTATIN SODIUM) Take 1 tablet by mouth once a day - Signed Added new medication of CARVEDILOL 6.25 MG TABS (CARVEDILOL) Take 1 tablet by mouth two times a day - Signed Rx of PLAVIX 75 MG TABS (CLOPIDOGREL BISULFATE) Take 1 tablet by mouth once a day;  #30 x 0;  Signed;  Entered by: Bethel Born MD;  Authorized by: Bethel Born MD;  Method used: Electronically to [facility] [phone]*, [address], [place], [place]  [address], Ph: [phone], Fax: [phone] Rx of FUROSEMIDE 40 MG TABS (FUROSEMIDE) Take 1 tablet by mouth once a day;  #30 x 0;  Signed;  Entered by: Bethel Born MD;  Authorized by: Bethel Born MD;  Method used: Electronically to [facility] [phone]*, [address] [place], [place]  [address],  Ph: [phone], Fax: [phone] Rx of LISINOPRIL 2.5 MG TABS (LISINOPRIL) Take 1 tablet by mouth two times a day;  #30 x 0;  Signed;  Entered by: Bethel Born MD;  Authorized by: Bethel Born MD;  Method used: Electronically to [facility] [phone]*, [address] [place], [place]  [address], Ph: [phone], Fax: [phone] Rx of AVELOX 400 MG TABS (MOXIFLOXACIN HCL) Take 1 tablet by mouth once a day for 5  days;  #5 x 0;  Signed;  Entered by: Bethel Born MD;  Authorized by: Bethel Born MD;  Method used: Electronically to [facility] [phone]*, [address] [place], [place]  [address], Ph: [phone], Fax: [phone] Rx of PRAVASTATIN SODIUM 40 MG TABS (PRAVASTATIN SODIUM) Take 1 tablet by mouth once a day;  #30 x 0;  Signed;  Entered by: Bethel Born MD;  Authorized by: Bethel Born MD;  Method used: Electronically to [facility] [phone]*, [address], [place], [place]  [address], Ph: [phone], Fax: [phone] Rx of CARVEDILOL 6.25 MG TABS (CARVEDILOL) Take 1 tablet by mouth two times a day;  #30 x 0;  Signed;  Entered by: Bethel Born MD;  Authorized by: Bethel Born MD;  Method used: Electronically to [facility] [phone]*, [address], [place], [place]  [address], Ph: [phone], Fax: [phone] Observations: Added new observation of INSTRUCTIONS: Your next appointement with [facility] is on March 17th, 2011 at 1:15pm Your appointment for swallowing difficulties is on March 15th, 2011 at 10:45. There number is [phone] and the address is [facility], [address] [place], [place] (04/15/2009 11:53)    Prescriptions: CARVEDILOL 6.25 MG TABS (CARVEDILOL) Take 1 tablet by mouth two times a day  #30 x 0   Entered and Authorized by:   Bethel Born MD   Signed by:   Bethel Born MD on 04/15/2009   Method used:   Electronically to        [facility] [phone]* (retail)       [address]       [place], [place]  [address]       Ph: [phone]       Fax: [phone]   RxID:   [id number] PRAVASTATIN SODIUM 40 MG TABS (PRAVASTATIN SODIUM) Take 1 tablet by mouth once a day  #30 x 0   Entered and Authorized by:   Bethel Born MD   Signed by:   Bethel Born MD on 04/15/2009   Method used:   Electronically to        [facility] [phone]* (retail)       [address]       [place], [place]  [address]        Ph: [phone]       Fax: [phone]   RxID:   [id number] AVELOX 400 MG TABS (MOXIFLOXACIN HCL) Take 1 tablet by mouth once a day for 5 days  #5 x 0   Entered and Authorized by:   Bethel Born MD   Signed by:   Bethel Born MD on 04/15/2009   Method used:   Electronically to        [facility] [phone]* (retail)       [address]       [place], [place]  [address]       Ph: [phone]       Fax: [phone]   RxID:   [id number] LISINOPRIL 2.5 MG TABS (LISINOPRIL) Take  1 tablet by mouth two times a day  #30 x 0   Entered and Authorized by:   Bethel Born MD   Signed by:   Bethel Born MD on 04/15/2009   Method used:   Electronically to        [facility] [phone]* (retail)       [address]       [place], [place]  [address]       Ph: [phone]       Fax: [phone]   RxID:   [id number] FUROSEMIDE 40 MG TABS (FUROSEMIDE) Take 1 tablet by mouth once a day  #30 x 0   Entered and Authorized by:   Bethel Born MD   Signed by:   Bethel Born MD on 04/15/2009   Method used:   Electronically to        [facility] [phone]* (retail)       [address]       [place], [place]  [address]       Ph: [phone]       Fax: [phone]   RxID:   [id number] PLAVIX 75 MG TABS (CLOPIDOGREL BISULFATE) Take 1 tablet by mouth once a day  #30 x 0   Entered and Authorized by:   Bethel Born MD   Signed by:   Bethel Born MD on 04/15/2009   Method used:   Electronically to        [facility] [phone]* (retail)       [address]       [place], [place]  [address]       Ph: [phone]       Fax: [phone]   RxID:   [id number]    Patient Instructions: 1)  Your next appointement with [facility] is on March 17th, 2011 at 1:15pm 2)  Your appointment for swallowing difficulties is on March 15th, 2011 at 10:45. There number is [phone] and the address is [facility], [address] [place],  [place]

## 2010-03-16 NOTE — Miscellaneous (Signed)
  Clinical Lists Changes  Observations: Added new observation of PAST MED HX: Community-acquired pneumonia   hospital ....04/07/2009 Hypertension Hyperlipidemia Anemia of chronic disease   Hgb 8...transfusion hospital 04/2009 CHF.Marland Kitchenwhile in hospital 04/07/2009--- EF  45%...echo...04/08/2009 Esophageal stricture....dilitation at [facility] ,.then Dr Arlyce Dice CKD  II Carotid artery disease total right carotid, 50% left carotid by report from the patient  /   60-79% LICA BY Dr Edilia Bo... 08/2009 GERD Gout Impaired fasting glucose Benign prostatic hypertrophy  (12/18/2009 13:24) Added new observation of REFERRING MD: Doneen Poisson, MD (12/18/2009 13:24) Added new observation of PRIMARY MD: [facility] (12/18/2009 13:24)       Past History:  Past Medical History: Community-acquired pneumonia   hospital ....04/07/2009 Hypertension Hyperlipidemia Anemia of chronic disease   Hgb 8...transfusion hospital 04/2009 CHF.Marland Kitchenwhile in hospital 04/07/2009--- EF  45%...echo...04/08/2009 Esophageal stricture....dilitation at [facility] ,.then Dr Arlyce Dice CKD  II Carotid artery disease total right carotid, 50% left carotid by report from the patient  /   60-79% LICA BY Dr Edilia Bo... 08/2009 GERD Gout Impaired fasting glucose Benign prostatic hypertrophy

## 2010-03-16 NOTE — Progress Notes (Signed)
Summary: Calling for test results  Phone Note Call from Patient Call back at Home Phone [phone]   Caller: Spouse/Valentne Summary of Call: Calling for test results Initial call taken by: Judie Grieve,  June 12, 2009 2:52 PM  Follow-up for Phone Call        wife given results Meredith Staggers, RN  June 12, 2009 3:05 PM

## 2010-03-16 NOTE — Letter (Signed)
Summary: [facility]   [facility]   Imported By: Roderic Ovens 09/23/2009 16:24:29  _____________________________________________________________________  External Attachment:    Type:   Image     Comment:   External Document

## 2010-03-16 NOTE — Assessment & Plan Note (Signed)
Summary: F.U AFTER PROCEDURE....EM   History of Present Illness Visit Type: Follow-up Visit Primary GI MD: Melvia Heaps MD [facility] Primary Provider: [facility] Requesting Provider: Doneen Poisson, MD Chief Complaint: follow-up procedure History of Present Illness:   Anthony Werner has returned following upper endoscopy with dilatation.  At endoscopy an early distal esophagus stricture was seen and dilated.  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 a tight esophageal stricture.  The latter was dilated to 12 mm.  Anthony Werner reports significant improvement in his dysphagia though it remains.   GI Review of Systems      Denies abdominal pain, acid reflux, belching, bloating, chest pain, dysphagia with liquids, dysphagia with solids, heartburn, loss of appetite, nausea, vomiting, vomiting blood, weight loss, and  weight gain.        Denies anal fissure, black tarry stools, change in bowel habit, constipation, diarrhea, diverticulosis, fecal incontinence, heme positive stool, hemorrhoids, irritable bowel syndrome, jaundice, light color stool, liver problems, rectal bleeding, and  rectal pain.    Current Medications (verified): 1)  Omeprazole 20 Mg Cpdr (Omeprazole) .... Take 1 Tablet By Mouth Once A Day 2)  Plavix 75 Mg Tabs (Clopidogrel Bisulfate) .... Take 1 Tablet By Mouth Once A Day 3)  Furosemide 40 Mg Tabs (Furosemide) .... Take 1 Tablet By Mouth Once A Day 4)  Aspir-Low 81 Mg Tbec (Aspirin) .... Take 1 Tablet By Mouth Once A Day 5)  Lisinopril 5 Mg Tabs (Lisinopril) .... Take 1 Tablet By Mouth Once A Day 6)  Pravastatin Sodium 40 Mg Tabs (Pravastatin Sodium) .... Take 1 Tablet By Mouth Once A Day 7)  Carvedilol 6.25 Mg Tabs (Carvedilol) .... Take 1 Tablet By Mouth Two Times A Day 8)  Vitamin C 500 Mg  Tabs (Ascorbic Acid) .... Once Daily 9)  Vitamin B Complex-C   Caps (B Complex-C) .... Once  Daily 10)  Vitamin D 400 Unit  Tabs (Cholecalciferol) .... Once Daily  Allergies (verified): 1)  ! Atenolol 2)  ! * Diltiazem 3)  ! Iron 4)  ! Hydrochlorothiazide 5)  ! * Labetalol 6)  ! Niacin 7)  ! * Terazosin 8)  ! * Fleodapine 9)  ! * Fluvastatin 10)  ! Lovastatin 11)  ! Simvastatin  Past History:  Past Medical History: Reviewed history from 06/01/2009 and no changes required. Community-acquired pneumonia   hospital ....04/07/2009 Hypertension Hyperlipidemia benign prostatic hyperplasia anemia of chronic disease   Hgb 8...transfusion hospital 04/2009 CHF.Marland Kitchenwhile in hospital 04/07/2009 EF  45%...echo...04/08/2009 Esophageal stricture....dilitation at [facility] ,..over the years CKD  II carotid artery disease total right carotid, 50% left carotid by report from the patient  Past Surgical History: Reviewed history from 04/28/2009 and no changes required. Cataract Extraction Hernia Surgery Tonsillectomy  Family History: Reviewed history from 04/28/2009 and no changes required. Family History of Prostate Cancer:Father Family History of Heart Disease:Brothers x2  No FH of Colon Cancer:  Social History: Reviewed history from 04/28/2009 and no changes required. Married  Tobacco Use - Former. -- 58 Occupation: Retired Doctor, hospital Alcohol Use - no Illicit Drug Use - no  Review of Systems  The patient denies allergy/sinus, anemia, anxiety-new, arthritis/joint pain, back pain, blood in urine, breast changes/lumps, change in vision, confusion, cough, coughing up blood, depression-new, fainting, fatigue, fever, headaches-new, hearing problems, heart murmur, heart rhythm changes, itching, muscle pains/cramps, night sweats, nosebleeds, shortness of breath, skin rash, sleeping problems, sore throat, swelling of  feet/legs, swollen lymph glands, thirst - excessive, urination - excessive, urination changes/pain, urine leakage, vision changes, and voice change.    Vital Signs:  Patient  profile:   77 year old male Height:      67 inches Weight:      129 pounds BMI:     20.28 Pulse rate:   84 / minute Pulse rhythm:   regular BP sitting:   132 / 50  (left arm)  Vitals Entered By: Milford Cage NCMA (Jun 22, 2009 2:48 PM)   Impression & Recommendations:  Problem # 1:  ESOPHAGEAL STRICTURE (ICD-530.3) Assessment Improved  Plan to dilate his upper esophageal stricture with a balloon dilator.  If this is not fully effective I would consider esophageal manometry to measure his upper esophageal sphincter.  Plavix will be held in advance of the procedure.  Orders: EGD (EGD) Barium Swallow (Barium Swallow)  Patient Instructions: 1)  Copy sent to : Seth Bake 2)  Your EGD is scheduled for 07/03/2009 at 3pm 3)  Hold Plavix 7 days prior to your procedure per Dr Arlyce Dice 4)  Your Barium Swallow is scheduled for 06/30/2009 at 10am at [facility] Radiology department 5)  The medication list was reviewed and reconciled.  All changed / newly prescribed medications were explained.  A complete medication list was provided to the patient / caregiver.

## 2010-03-16 NOTE — Assessment & Plan Note (Signed)
Summary: dysphagia--ch.   History of Present Illness Visit Type: Initial Consult Primary GI MD: Melvia Heaps MD [facility] Primary Provider: [facility] Requesting Provider: Doneen Poisson, MD Chief Complaint: dysphagia, especially large pills History of Present Illness:   Anthony Werner is a pleasant 77 year old Afro-American male with a history of esophageal stricture referred at the request of Dr.Klima for evaluation of dysphagia.  He has a history of an esophageal stricture for which he has undergone periodic dilatation  He is complaining of several months of recurrent dysphagia to solids. He lost 20 pounds over the past 2 months though not necessarily from problems with eating.  He denies dysphagia he takes omeprazole  The patient has a history of congestive heart failure and chronic renal disease.  He has coronary artery disease and is on Plavix.   GI Review of Systems    Reports acid reflux, dysphagia with liquids, dysphagia with solids, loss of appetite, and  weight loss.   Weight loss of 20 pounds over 2 months.   Denies abdominal pain, belching, bloating, chest pain, heartburn, nausea, vomiting, vomiting blood, and  weight gain.        Denies anal fissure, black tarry stools, change in bowel habit, constipation, diarrhea, diverticulosis, fecal incontinence, heme positive stool, hemorrhoids, irritable bowel syndrome, jaundice, light color stool, liver problems, rectal bleeding, and  rectal pain. Anticoagulation Management History:      Positive risk factors for bleeding include an age of 43 years or older.  The bleeding index is 'intermediate risk'.  Positive CHADS2 values include History of CHF, History of HTN, and Age > 35 years old.     Preventive Screening-Counseling & Management      Drug Use:  no.      Current Medications (verified): 1)  Omeprazole 20 Mg Cpdr (Omeprazole) .... Take 1 Tablet By Mouth Once A Day 2)  Plavix 75 Mg Tabs (Clopidogrel Bisulfate) .... Take 1 Tablet By  Mouth Once A Day 3)  Furosemide 40 Mg Tabs (Furosemide) .... Take 1 Tablet By Mouth Once A Day 4)  Aspir-Low 81 Mg Tbec (Aspirin) .... Take 1 Tablet By Mouth Once A Day 5)  Lisinopril 2.5 Mg Tabs (Lisinopril) .... Take 1 Tablet By Mouth Two Times A Day 6)  Avelox 400 Mg Tabs (Moxifloxacin Hcl) .... Take 1 Tablet By Mouth Once A Day For 5 Days 7)  Pravastatin Sodium 40 Mg Tabs (Pravastatin Sodium) .... Take 1 Tablet By Mouth Once A Day 8)  Carvedilol 6.25 Mg Tabs (Carvedilol) .... Take 1 Tablet By Mouth Two Times A Day  Allergies: 1)  ! Atenolol 2)  ! * Diltiazem 3)  ! Iron 4)  ! Hydrochlorothiazide 5)  ! * Labetalol 6)  ! Niacin 7)  ! * Omeprazole 8)  ! * Terazosin 9)  ! * Feladipine 10)  ! * Flurastatin 11)  ! Lovastatin 12)  ! Simvastatin  Past History:  Past Surgical History: Cataract Extraction Hernia Surgery Tonsillectomy  Family History: Family History of Prostate Cancer:Father Family History of Heart Disease:Brothers x2  No FH of Colon Cancer:  Social History: Married  Tobacco Use - Former. -- 65 Occupation: Retired Doctor, hospital Alcohol Use - no Illicit Drug Use - no Drug Use:  no  Review of Systems       The patient complains of change in vision, coughing up blood, depression-new, fatigue, shortness of breath, sore throat, and swelling of feet/legs.  The patient denies allergy/sinus, anemia, anxiety-new, arthritis/joint pain, back pain, blood  in urine, breast changes/lumps, confusion, cough, fainting, fever, headaches-new, hearing problems, heart murmur, heart rhythm changes, itching, menstrual pain, muscle pains/cramps, night sweats, nosebleeds, pregnancy symptoms, skin rash, sleeping problems, swollen lymph glands, thirst - excessive , urination - excessive , urination changes/pain, urine leakage, vision changes, and voice change.         All other systems were reviewed and were negative   Vital Signs:  Patient profile:   78 year old male Height:       67 inches Weight:      127.25 pounds BMI:     20.00 Pulse rate:   72 / minute Pulse rhythm:   regular BP sitting:   122 / 48  (left arm) Cuff size:   regular  Vitals Entered By: Anthony Werner CMA Duncan Dull) (April 28, 2009 10:24 AM)  Physical Exam  Additional Exam:  She is a thin but well-developed male  skin: anicteric HEENT: normocephalic; PEERLA; no nasal or pharyngeal abnormalities neck: supple nodes: no cervical lymphadenopathy chest: clear to ausculatation and percussion heart: no  gallops, or rubs; there is a 1/6 early systolic murmur at the left sternal border abd: soft, nontender; BS normoactive; no abdominal masses, tenderness, organomegaly rectal: deferred ext: no cynanosis, clubbing, edema skeletal: no deformities neuro: oriented x 3; no focal abnormalities    Impression & Recommendations:  Problem # 1:  ESOPHAGEAL STRICTURE (ICD-530.3) Assessment Deteriorated  Plan upper endoscopy with balloon dilatation.  Plavix will be held in advance of the procedure.  Orders: ZEGD Balloon Dil (ZEGD Balloon)  Problem # 2:  ATHEROSCLEROTIC CARDIOVASCULAR DISEASE (ICD-429.2) Assessment: Comment Only  Problem # 3:  CHF (ICD-428.0) Assessment: Comment Only  Anticoagulation Management Assessment/Plan:            Patient Instructions: 1)  Conscious Sedation brochure given.  2)  Upper Endoscopy with Dilatation brochure given.  3)  Your EGD with balloon dil is scheduled for 05/12/2009 at 8:30am at [facility]. 4)  You have been instructed on your Plavix 5)  cc Dr. Doneen Poisson 6)  The medication list was reviewed and reconciled.  All changed / newly prescribed medications were explained.  A complete medication list was provided to the patient / caregiver.

## 2010-03-16 NOTE — Assessment & Plan Note (Signed)
Summary: per check ot/sf   Visit Type:  Follow-up Referring Provider:  Doneen Poisson, MD Primary Provider:  [facility]  CC:  vascular disease.  History of Present Illness: The patient is seen for followup of his hypertension, history of ejection fraction of 45%, carotid artery disease.  I saw him last May, 2011.  Because his carotid disease is complex I requested consult by Dr. Edilia Bo.  He was seen and he will be followed by Dr.Dickson.  The patient does have total carotid on the right and significant disease on the left.  He will be followed carefully.  The patient does have some mild chest discomfort at times.  He says he may feel this when beginning to move his yard, but it improves over time.  We know he had a nuclear scan in April, 2011 showing no scar or ischemia.  Current Medications (verified): 1)  Omeprazole 20 Mg Cpdr (Omeprazole) .... Take 1 Tablet By Mouth Once A Day 2)  Plavix 75 Mg Tabs (Clopidogrel Bisulfate) .... Take 1 Tablet By Mouth Once A Day 3)  Furosemide 40 Mg Tabs (Furosemide) .... Take 1 Tablet By Mouth Once A Day 4)  Aspir-Low 81 Mg Tbec (Aspirin) .... Take 1 Tablet By Mouth Once A Day 5)  Lisinopril 5 Mg Tabs (Lisinopril) .... Take 1 Tablet By Mouth Once A Day 6)  Pravastatin Sodium 40 Mg Tabs (Pravastatin Sodium) .... Take 1 Tablet By Mouth Once A Day 7)  Carvedilol 6.25 Mg Tabs (Carvedilol) .... Take 1 Tablet By Mouth Two Times A Day 8)  Vitamin C 500 Mg  Tabs (Ascorbic Acid) .... Once Daily 9)  Vitamin B Complex-C   Caps (B Complex-C) .... Once Daily 10)  Vitamin D 400 Unit  Tabs (Cholecalciferol) .... Once Daily  Allergies (verified): 1)  ! Atenolol 2)  ! * Diltiazem 3)  ! Iron 4)  ! Hydrochlorothiazide 5)  ! * Labetalol 6)  ! Niacin 7)  ! * Terazosin 8)  ! * Fleodapine 9)  ! * Fluvastatin 10)  ! Lovastatin 11)  ! Simvastatin  Past History:  Past Medical History: Community-acquired pneumonia   hospital  ....04/07/2009 Hypertension Hyperlipidemia Anemia of chronic disease   Hgb 8...transfusion hospital 04/2009 CHF.Marland Kitchenwhile in hospital 04/07/2009--- EF  45%...echo...04/08/2009 Esophageal stricture....dilitation at [facility] ,.then Dr Arlyce Dice CKD  II Carotid artery disease total right carotid, 50% left carotid by report from the patient  /   60-79% LICA BY Dr Edilia Bo... 08/2009 GERD Gout Impaired fasting glucose Benign prostatic hypertrophy Chest discomfort   nuclear.... April, 2011.... no scar or ischemia  Review of Systems       Patient denies fever, chills, headache, sweats, rash, change in vision, change in hearing, cough, nausea vomiting, urinary symptoms.  All of the systems are reviewed and are negative.  Vital Signs:  Patient profile:   77 year old male Height:      67 inches Weight:      142 pounds BMI:     22.32 Pulse rate:   65 / minute BP sitting:   142 / 62  (left arm) Cuff size:   regular  Vitals Entered By: Hardin Negus, RMA (December 21, 2009 11:13 AM)  Physical Exam  General:  patient is quite stable. Head:  head is atraumatic. Eyes:  no dental asthma. Neck:  no jugular venous distention.  There is a left carotid bruit. Chest Wall:  no chest wall tenderness. Lungs:  lungs are clear.  Respiratory effort is unlabored.  Heart:  cardiac exam reveals S1-S2.  No clicks or significant murmurs Abdomen:  abdomen is soft there Msk:  no musculoskeletal deformities. Extremities:  no peripheral edema. Skin:  no skin rashes. Psych:  patient is oriented to person time and place.  Affect is normal.  He is here with his wife today.   Impression & Recommendations:  Problem # 1:  CHEST PAIN (ICD-786.50)  His updated medication list for this problem includes:    Plavix 75 Mg Tabs (Clopidogrel bisulfate) .Marland Kitchen... Take 1 tablet by mouth once a day    Aspir-low 81 Mg Tbec (Aspirin) .Marland Kitchen... Take 1 tablet by mouth once a day    Lisinopril 5 Mg Tabs (Lisinopril) .Marland Kitchen... Take 1 tablet by mouth  once a day    Carvedilol 6.25 Mg Tabs (Carvedilol) .Marland Kitchen... Take 1 tablet by mouth two times a day This point I am not convinced that he is having significant ischemic pain.  He had a nuclear scan in April, 2011 that showed no significant abnormalities.  He will be followed. EKG was done today and reviewed by me.  There is sinus bradycardia.  There are no acute changes.  No further workup.  6 month followup.  Problem # 2:  CHRONIC COMB SYSTOLIC&DIASTOLIC HEART FAILURE (ICD-428.42)  His updated medication list for this problem includes:    Plavix 75 Mg Tabs (Clopidogrel bisulfate) .Marland Kitchen... Take 1 tablet by mouth once a day    Furosemide 40 Mg Tabs (Furosemide) .Marland Kitchen... Take 1 tablet by mouth once a day    Aspir-low 81 Mg Tbec (Aspirin) .Marland Kitchen... Take 1 tablet by mouth once a day    Lisinopril 5 Mg Tabs (Lisinopril) .Marland Kitchen... Take 1 tablet by mouth once a day    Carvedilol 6.25 Mg Tabs (Carvedilol) .Marland Kitchen... Take 1 tablet by mouth two times a day The patient has mild left ventricular dysfunction. He has not had any signs of volume overload.  He is on appropriate medications.  No change in therapy.  Problem # 3:  CAROTID ARTERY DISEASE (ICD-433.10)  His updated medication list for this problem includes:    Plavix 75 Mg Tabs (Clopidogrel bisulfate) .Marland Kitchen... Take 1 tablet by mouth once a day    Aspir-low 81 Mg Tbec (Aspirin) .Marland Kitchen... Take 1 tablet by mouth once a day His carotid disease is quite significant.  He is being very carefully followed. I have carefully reviewed the report from Dr.Dickson.  Problem # 4:  ESSENTIAL HYPERTENSION, BENIGN (ICD-401.1)  His updated medication list for this problem includes:    Furosemide 40 Mg Tabs (Furosemide) .Marland Kitchen... Take 1 tablet by mouth once a day    Aspir-low 81 Mg Tbec (Aspirin) .Marland Kitchen... Take 1 tablet by mouth once a day    Lisinopril 5 Mg Tabs (Lisinopril) .Marland Kitchen... Take 1 tablet by mouth once a day    Carvedilol 6.25 Mg Tabs (Carvedilol) .Marland Kitchen... Take 1 tablet by mouth two times a  day Blood pressure is controlled today.  No change in therapy.  Other Orders: EKG w/ Interpretation (93000)  Patient Instructions: 1)  Your physician wants you to follow-up in:  6 months.  You will receive a reminder letter in the mail two months in advance. If you don't receive a letter, please call our office to schedule the follow-up appointment.

## 2010-03-16 NOTE — Miscellaneous (Signed)
  Clinical Lists Changes  Problems: Added new problem of * COMMUNITY PNEUMONIA   04/2009 Added new problem of * ANEMIA Added new problem of * EF  45% Added new problem of * CKD Observations: Added new observation of PAST MED HX: Community-acquired pneumonia   hospital ....04/07/2009 Hypertension Hyperlipidemia benign prostatic hyperplasia anemia of chronic disease   Hgb 8...transfusion hospital 04/2009 CHF.Marland Kitchenwhile in hospital 04/07/2009 EF  45%...echo...04/08/2009 Esophageal stricture....dilitation at [facility] ,..over the years CKD  II   (05/30/2009 15:33) Added new observation of PRIMARY MD: [facility] (05/30/2009 15:33)       Past History:  Past Medical History: Community-acquired pneumonia   hospital ....04/07/2009 Hypertension Hyperlipidemia benign prostatic hyperplasia anemia of chronic disease   Hgb 8...transfusion hospital 04/2009 CHF.Marland Kitchenwhile in hospital 04/07/2009 EF  45%...echo...04/08/2009 Esophageal stricture....dilitation at [facility] ,..over the years CKD  II

## 2010-03-16 NOTE — Miscellaneous (Signed)
[facility]   Imported By: Margie Billet 05/15/2009 11:56:20  _____________________________________________________________________  External Attachment:    Type:   Image     Comment:   External Document

## 2010-03-16 NOTE — Miscellaneous (Signed)
Summary: [facility]  [facility]   Imported By: Florinda Marker 05/04/2009 14:36:28  _____________________________________________________________________  External Attachment:    Type:   Image     Comment:   External Document

## 2010-03-16 NOTE — Procedures (Signed)
Summary: Upper Endoscopy w/DIL  Patient: Anthony Werner Note: All result statuses are Final unless otherwise noted.  Tests: (1) Upper Endoscopy w/DIL (UED)  UED Upper Endoscopy w/DIL                             DONE     [facility]     [address] [place], [place]  [address]           ENDOSCOPY PROCEDURE REPORT           PATIENT:  Anthony Werner, Anthony Werner  MR#:  [id number]     BIRTHDATE:  1924-04-13, 85 yrs. old  GENDER:  male           ENDOSCOPIST:  Barbette Hair. Arlyce Dice, MD     ASSISTANT:           PROCEDURE DATE:  07/03/2009     PROCEDURE:  EGD with balloon dilatation     ASA CLASS:  Class II     INDICATIONS:  1) dilation of esophageal stricture           MEDICATIONS:   Fentanyl 25 mcg IV, Versed 4 mg IV, glycopyrrolate     (Robinal) 0.2 mg IV     TOPICAL ANESTHETIC:  Cetacaine Spray           DESCRIPTION OF PROCEDURE:   After the risks benefits and     alternatives of the procedure were thoroughly explained, informed     consent was obtained.  The  endoscope was introduced through the     mouth and advanced to the third portion of the duodenum, without     limitations.  The instrument was slowly withdrawn as the mucosa     was carefully examined.     <<PROCEDUREIMAGES>>           A stricture was found in the proximal esophagus (see image2 and     image3). Early stricture 23cm from incisors  The examination was     otherwise normal.    Dilation was then performed at the proximal     esophagus           1) Dilator:  Balloon  Size(s):  12-13.8-15     Resistance:  moderate  Heme:  none     Appearance:           COMPLICATIONS:  None           ENDOSCOPIC IMPRESSION:     1) Stricture in the proximal esophagus     RECOMMENDATIONS:     1) Repeat dilation as needed.     2) resume plavix in 5 days           REPEAT EXAM:  No           ______________________________     Barbette Hair. Arlyce Dice, MD           CC:  Doneen Poisson, M.D.           n.     eSIGNED:   Barbette Hair.  Tiarra Anastacio at 07/03/2009 12:55 PM           Ernest Pine, [id number]  Note: An exclamation mark (!) indicates a result that was not dispersed into the flowsheet. Document Creation Date: 07/03/2009 1:54 PM _______________________________________________________________________  (1) Order result status: Final Collection or observation date-time: 07/03/2009 12:51 Requested date-time:  Receipt date-time:  Reported date-time:  Referring Physician:  Ordering Physician: Melvia Heaps [phone]) Specimen Source:  Source: Launa Grill Order Number: [id number] Lab site:

## 2010-03-16 NOTE — Assessment & Plan Note (Signed)
Summary: eph/ gd   Visit Type:  post hospital visit Primary Provider:  [facility]  CC:  CHF / chest pain / left ventricular dysfunction.  History of Present Illness: Patient is seen for the evaluation of chest pain, congestive heart failure, left ventricular dysfunction.  He also has carotid artery disease by his report.  He has been followed at the [place] but is moving all of his care here in [place].  I met him in the hospital recently when he had mild troponin elevation with congestive heart failure around the time of pneumonia.  I felt that the troponins were secondary to his event.  He did have a 2-D echo.  His ejection fraction was 45%.  He does have some exertional shortness of breath.  He also does have some chest tightness when walking.  Current Medications (verified): 1)  Omeprazole 20 Mg Cpdr (Omeprazole) .... Take 1 Tablet By Mouth Once A Day 2)  Plavix 75 Mg Tabs (Clopidogrel Bisulfate) .... Take 1 Tablet By Mouth Once A Day 3)  Furosemide 40 Mg Tabs (Furosemide) .... Take 1 Tablet By Mouth Once A Day 4)  Aspir-Low 81 Mg Tbec (Aspirin) .... Take 1 Tablet By Mouth Once A Day 5)  Lisinopril 5 Mg Tabs (Lisinopril) .... Take 1 Tablet By Mouth Once A Day 6)  Pravastatin Sodium 40 Mg Tabs (Pravastatin Sodium) .... Take 1 Tablet By Mouth Once A Day 7)  Carvedilol 6.25 Mg Tabs (Carvedilol) .... Take 1 Tablet By Mouth Two Times A Day 8)  Vitamin C 500 Mg  Tabs (Ascorbic Acid) .... Once Daily 9)  Vitamin B Complex-C   Caps (B Complex-C) .... Once Daily 10)  Vitamin D 400 Unit  Tabs (Cholecalciferol) .... Once Daily  Allergies (verified): 1)  ! Atenolol 2)  ! * Diltiazem 3)  ! Iron 4)  ! Hydrochlorothiazide 5)  ! * Labetalol 6)  ! Niacin 7)  ! * Terazosin 8)  ! * Fleodapine 9)  ! * Fluvastatin 10)  ! Lovastatin 11)  ! Simvastatin  Past History:  Past Medical History: Community-acquired pneumonia   hospital ....04/07/2009 Hypertension Hyperlipidemia benign prostatic  hyperplasia anemia of chronic disease   Hgb 8...transfusion hospital 04/2009 CHF.Marland Kitchenwhile in hospital 04/07/2009 EF  45%...echo...04/08/2009 Esophageal stricture....dilitation at [facility] ,..over the years CKD  II carotid artery disease total right carotid, 50% left carotid by report from the patient  Review of Systems       Patient denies fever, chills, headache, sweats, rash, change in vision, change in hearing, cough, nausea vomiting, urinary symptoms.  All of the systems are negative other than the history of present illness.   Vital Signs:  Patient profile:   77 year old male Height:      67 inches Weight:      129 pounds BMI:     20.28 Pulse rate:   59 / minute BP sitting:   142 / 48  (left arm) Cuff size:   regular  Vitals Entered By: Hardin Negus, RMA (June 01, 2009 11:28 AM)  Physical Exam  General:  Patient is stable today. Head:  head is atraumatic. Eyes:  no xanthelasma. Neck:  no jugular venous distention. There are bilateral carotid bruits. Chest Wall:  no chest wall tenderness. Lungs:  lungs are clear.  Respiratory effort is nonlabored. Heart:  cardiac exam reveals S1 and S2.  There no clicks or significant murmurs. Abdomen:  abdomen is soft. Msk:  no musculoskeletal deformities. Extremities:  no peripheral edema. Skin:  no skin rashes. Psych:  patient is oriented to person time and place.  Affect is normal.  He is here with his wife today.   Impression & Recommendations:  Problem # 1:  CAROTID ARTERY DISEASE (ICD-433.10)  His updated medication list for this problem includes:    Plavix 75 Mg Tabs (Clopidogrel bisulfate) .Marland Kitchen... Take 1 tablet by mouth once a day    Aspir-low 81 Mg Tbec (Aspirin) .Marland Kitchen... Take 1 tablet by mouth once a day The patient reports significant carotid artery disease.  He says that he had a recent visit with Doppler's and possibly evaluation by vascular surgery at the [place].  I've asked the patient and his wife to obtain records of those visits.   They will bring those copies to [place].  Problem # 2:  * EF  45% The ejection fraction is 45%.  He is not volume overloaded today.  His pulse is 59.  I'm hesitant to push any other medicines today but we will follow him carefully over time.  Problem # 3:  ESSENTIAL HYPERTENSION, BENIGN (ICD-401.1)  His updated medication list for this problem includes:    Furosemide 40 Mg Tabs (Furosemide) .Marland Kitchen... Take 1 tablet by mouth once a day    Aspir-low 81 Mg Tbec (Aspirin) .Marland Kitchen... Take 1 tablet by mouth once a day    Lisinopril 5 Mg Tabs (Lisinopril) .Marland Kitchen... Take 1 tablet by mouth once a day    Carvedilol 6.25 Mg Tabs (Carvedilol) .Marland Kitchen... Take 1 tablet by mouth two times a day Blood pressure is controlled today.  No change in therapy.  Problem # 4:  CHEST DISCOMFORT (ICD-786.59)  His updated medication list for this problem includes:    Plavix 75 Mg Tabs (Clopidogrel bisulfate) .Marland Kitchen... Take 1 tablet by mouth once a day    Aspir-low 81 Mg Tbec (Aspirin) .Marland Kitchen... Take 1 tablet by mouth once a day    Lisinopril 5 Mg Tabs (Lisinopril) .Marland Kitchen... Take 1 tablet by mouth once a day    Carvedilol 6.25 Mg Tabs (Carvedilol) .Marland Kitchen... Take 1 tablet by mouth two times a day The patient has chest discomfort with exercise.  He has some LV dysfunction.  EKG is done today reviewed by me.  There is sinus bradycardia with increased voltage.  Patient will have a stress Myoview scan for further assessment.  I was then see him back to assess these findings.  Orders: Nuclear Stress Test (Nuc Stress Test)  Other Orders: EKG w/ Interpretation (93000)  Patient Instructions: 1)  Your physician has requested that you have an exercise stress myoview.  For further information please visit [email or web address]  Please follow instruction sheet, as given. 2)  Follow up in 4 weeks

## 2010-03-16 NOTE — Letter (Signed)
Summary: EGD Instructions  [facility]  [address] [place], [place] [address]   Phone: [phone]  Fax: [phone]       Anthony Werner    [birth year]    MRN: [id number]       Procedure Day /Date:TUESDAY 05/12/2009     Arrival Time: 7:30AM     Procedure Time:8:30AM     Location of Procedure:                     X  [facility] ( Outpatient Registration)   PREPARATION FOR ENDOSCOPY/BALLOON   On3/29/2011 THE DAY OF THE PROCEDURE:  1.   No solid foods, milk or milk products are allowed after midnight the night before your procedure.  2.   Do not drink anything colored red or purple.  Avoid juices with pulp.  No orange juice.  3.  You may drink clear liquids until4:30AM, which is 4 hours before your procedure.                                                                                                CLEAR LIQUIDS INCLUDE: Water Jello Ice Popsicles Tea (sugar ok, no milk/cream) Powdered fruit flavored drinks Coffee (sugar ok, no milk/cream) Gatorade Juice: apple, white grape, white cranberry  Lemonade Clear bullion, consomm, broth Carbonated beverages (any kind) Strained chicken noodle soup Hard Candy   MEDICATION INSTRUCTIONS  Unless otherwise instructed, you should take regular prescription medications with a small sip of water as early as possible the morning of your procedure.   Stop taking Plavix  (7 days before procedure).   HOLD PLAVIX ON 3/22 PER DR KAPLAN            OTHER INSTRUCTIONS  You will need a responsible adult at least 75 years of age to accompany you and drive you home.   This person must remain in the waiting room during your procedure.  Wear loose fitting clothing that is easily removed.  Leave jewelry and other valuables at home.  However, you may wish to bring a book to read or an iPod/MP3 player to listen to music as you wait for your procedure to start.  Remove all body piercing jewelry and leave at  home.  Total time from sign-in until discharge is approximately 2-3 hours.  You should go home directly after your procedure and rest.  You can resume normal activities the day after your procedure.  The day of your procedure you should not:   Drive   Make legal decisions   Operate machinery   Drink alcohol   Return to work  You will receive specific instructions about eating, activities and medications before you leave.    The above instructions have been reviewed and explained to me by   _______________________    I fully understand and can verbalize these instructions _____________________________ Date _________

## 2010-03-16 NOTE — Progress Notes (Signed)
Summary: TRIAGE  Phone Note Call from Patient Call back at Home Phone [phone]   Caller: wife, Durel Salts Call For: Dr. Arlyce Dice Reason for Call: Talk to Nurse Summary of Call: would like to know why pt needs another EGD and also why it was not sch'ed at [facility] Initial call taken by: Vallarie Mare,  Jun 23, 2009 11:27 AM  Follow-up for Phone Call        Pt. is for a Balloon Dil, wife wants to know why it wasn't scheduled at [facility].   ROBIN--please advise  Follow-up by: Laureen Ochs LPN,  Jun 23, 2009 12:20 PM  Additional Follow-up for Phone Call Additional follow up Details #1::        It wasnt in my notes for a balloon dil. to be scheduled  that's why I scheduled it upstairs, but I do see it in his office note.   Rescheduled pts procedure for the hospital on 07/03/2009 at 12:30pm. Contacted pt L/M for the pt and also L/M for the pt to return my call. Explained the time and place change and left it on voice mail. Cancelled [facility] procedure for the 20th at 3:30pm. Also, pt. needs to be NPO 4 hours instead of 2. Additional Follow-up by: Merri Ray CMA Duncan Dull),  Jun 23, 2009 1:29 PM    Additional Follow-up for Phone Call Additional follow up Details #2::    All instructions for new procedure location/time reviewed with Mrs.Bohr by phone. No further questions, she will callback as needed. Follow-up by: Laureen Ochs LPN,  Jun 24, 2009 9:27 AM

## 2010-03-16 NOTE — Procedures (Signed)
Summary: Instructions for procedure/[facility] (Out pt)  Instructions for procedure/[facility] (Out pt)   Imported By: Sherian Rein 05/04/2009 07:50:35  _____________________________________________________________________  External Attachment:    Type:   Image     Comment:   External Document

## 2010-03-16 NOTE — Assessment & Plan Note (Signed)
Summary: 6 MONTH FOLLOW UP/[facility]   Vital Signs:  Patient profile:   77 year old male Weight:      140 pounds Temp:     98.0 degrees F oral Pulse rate:   64 / minute Pulse rhythm:   regular BP sitting:   180 / 60  (left arm) Cuff size:   large  Vitals Entered By: Mervin Hack CMA Duncan Dull) (January 19, 2010 10:47 AM) CC: 6 month follow-up   History of Present Illness: Has had a dry cough for a couple of weeks Occ clear mucus No fever and didn't feel sick No sig SOB--stable DOE No sore throuat No ear pain  No chest pain No sig palpitations--can occ feel an "off beat" when in bed No edema No change in exercise tolerance--may have actually improved per wife No orthopnea or PND  No sig fatigue  No stomach problems  No urinary problems Nocturia stable  ~2/night  Has checked BP at home but not lately Systolic usually around 140. Highest 152  Allergies: 1)  ! Atenolol 2)  ! * Diltiazem 3)  ! Iron 4)  ! Hydrochlorothiazide 5)  ! * Labetalol 6)  ! Niacin 7)  ! * Terazosin 8)  ! * Fleodapine 9)  ! * Fluvastatin 10)  ! Lovastatin 11)  ! Simvastatin  Past History:  Past medical, surgical, family and social histories (including risk factors) reviewed for relevance to current acute and chronic problems.  Past Medical History: Reviewed history from 12/21/2009 and no changes required. Community-acquired pneumonia   hospital ....04/07/2009 Hypertension Hyperlipidemia Anemia of chronic disease   Hgb 8...transfusion hospital 04/2009 CHF.Marland Kitchenwhile in hospital 04/07/2009--- EF  45%...echo...04/08/2009 Esophageal stricture....dilitation at [facility] ,.then Dr Arlyce Dice CKD  II Carotid artery disease total right carotid, 50% left carotid by report from the patient  /   60-79% LICA BY Dr Edilia Bo... 08/2009 GERD Gout Impaired fasting glucose Benign prostatic hypertrophy Chest discomfort   nuclear.... April, 2011.... no scar or ischemia  Past Surgical History: Reviewed history from  07/15/2009 and no changes required. Cataract Extraction Hernia Surgery Tonsillectomy Community acquired pneumonia/CHF  2/11  Family History: Reviewed history from 04/28/2009 and no changes required. Family History of Prostate Cancer:Father Family History of Heart Disease:Brothers x2  No FH of Colon Cancer:  Social History: Reviewed history from 07/15/2009 and no changes required. Married  3 daughters Tobacco Use - Former. -- 56 Occupation: Retired Doctor, hospital Alcohol Use - no Illicit Drug Use - no  Has living will Request wife, then oldest daughter to make health care decisions for him Luster Landsberg) Will accept resuscitation but no prolonged mechanical ventilation Not sure about tube feeds, might accept  Review of Systems       appetite is good weight is stable sleeps okay   Physical Exam  General:  alert and normal appearance.   Neck:  supple, no masses, no thyromegaly, no carotid bruits, and no cervical lymphadenopathy.   Lungs:  normal respiratory effort, no intercostal retractions, no accessory muscle use, normal breath sounds, no crackles, and no wheezes.   Heart:  normal rate, regular rhythm, no murmur, and no gallop.   Extremities:  no edema Psych:  normally interactive, good eye contact, not anxious appearing, and not depressed appearing.     Impression & Recommendations:  Problem # 1:  ESSENTIAL HYPERTENSION, BENIGN (ICD-401.1) Assessment Comment Only elevated today recheck systolic  ~170 has been fine  P: he will check at home and call if systolic over 150. WOuld  increase lisinopril then, unless cough continues  His updated medication list for this problem includes:    Furosemide 40 Mg Tabs (Furosemide) .Marland Kitchen... Take 1 tablet by mouth once a day    Lisinopril 5 Mg Tabs (Lisinopril) .Marland Kitchen... Take 1 tablet by mouth once a day    Carvedilol 6.25 Mg Tabs (Carvedilol) .Marland Kitchen... Take 1 tablet by mouth two times a day  BP today: 180/60 Prior BP: 142/62  (12/21/2009)  Labs Reviewed: K+: 5.0 (07/15/2009) Creat: : 1.1 (07/15/2009)   Chol: 164 (07/15/2009)   HDL: 54.00 (07/15/2009)   LDL: 71 (07/15/2009)   TG: 194.0 (07/15/2009)  Problem # 2:  CHRONIC COMB SYSTOLIC&DIASTOLIC HEART FAILURE (ICD-428.42) Assessment: Unchanged neutral fluid status no changes in functional status would just increase lisinopril if BP stays up  His updated medication list for this problem includes:    Plavix 75 Mg Tabs (Clopidogrel bisulfate) .Marland Kitchen... Take 1 tablet by mouth once a day    Furosemide 40 Mg Tabs (Furosemide) .Marland Kitchen... Take 1 tablet by mouth once a day    Aspir-low 81 Mg Tbec (Aspirin) .Marland Kitchen... Take 1 tablet by mouth once a day    Lisinopril 5 Mg Tabs (Lisinopril) .Marland Kitchen... Take 1 tablet by mouth once a day    Carvedilol 6.25 Mg Tabs (Carvedilol) .Marland Kitchen... Take 1 tablet by mouth two times a day  Problem # 3:  ANEMIA OF OTHER CHRONIC DISEASE (ICD-285.29) Assessment: Comment Only will recheck  has been stable though  Hgb: 9.7 (07/15/2009)   Hct: 28.1 (07/15/2009)   Platelets: 163.0 (07/15/2009) RBC: 2.93 (07/15/2009)   RDW: 14.3 (07/15/2009)   WBC: 4.9 (07/15/2009) MCV: 96.1 (07/15/2009)   MCHC: 34.4 (07/15/2009) Ferritin: 597 (05/14/2009) TSH: 0.92 (07/15/2009)  Problem # 4:  BENIGN PROSTATIC HYPERTROPHY (ICD-600.00) Assessment: Comment Only voiding okay no changes  Problem # 5:  CHRONIC KIDNEY DISEASE STAGE III (MODERATE) (ICD-585.3) Assessment: Unchanged  will recheck labs  Labs Reviewed: BUN: 27 (07/15/2009)   Cr: 1.1 (07/15/2009)    Hgb: 9.7 (07/15/2009)   Hct: 28.1 (07/15/2009)   Ca++: 9.2 (07/15/2009)   Phos: 4.2 (07/15/2009) TP: 6.9 (07/15/2009)   Alb: 3.9 (07/15/2009)  Orders: TLB-Renal Function Panel (80069-RENAL) TLB-CBC Platelet - w/Differential (85025-CBCD) Venipuncture (66440)  Complete Medication List: 1)  Omeprazole 20 Mg Cpdr (Omeprazole) .... Take 1 tablet by mouth once a day 2)  Plavix 75 Mg Tabs (Clopidogrel bisulfate) .... Take  1 tablet by mouth once a day 3)  Furosemide 40 Mg Tabs (Furosemide) .... Take 1 tablet by mouth once a day 4)  Aspir-low 81 Mg Tbec (Aspirin) .... Take 1 tablet by mouth once a day 5)  Lisinopril 5 Mg Tabs (Lisinopril) .... Take 1 tablet by mouth once a day 6)  Pravastatin Sodium 40 Mg Tabs (Pravastatin sodium) .... Take 1 tablet by mouth once a day 7)  Carvedilol 6.25 Mg Tabs (Carvedilol) .... Take 1 tablet by mouth two times a day 8)  Vitamin C 500 Mg Tabs (Ascorbic acid) .... Once daily 9)  Vitamin B Complex-c Caps (B complex-c) .... Once daily 10)  Vitamin D 400 Unit Tabs (Cholecalciferol) .... Once daily  Patient Instructions: 1)  Please check blood pressure once or twice a month. Please call if systolic (upper number) is regularly over 150 2)  Please schedule a follow-up appointment in 6 months .    Orders Added: 1)  Est. Patient Level IV [34742] 2)  TLB-Renal Function Panel [80069-RENAL] 3)  TLB-CBC Platelet - w/Differential [85025-CBCD] 4)  Venipuncture [59563]  Current Allergies (reviewed today): ! ATENOLOL ! * DILTIAZEM ! IRON ! HYDROCHLOROTHIAZIDE ! * LABETALOL ! NIACIN ! * TERAZOSIN ! * FLEODAPINE ! * FLUVASTATIN ! LOVASTATIN ! SIMVASTATIN

## 2010-03-16 NOTE — Procedures (Signed)
Summary: Upper Endoscopy w/DIL  Patient: Anthony Werner Note: All result statuses are Final unless otherwise noted.  Tests: (1) Upper Endoscopy w/DIL (UED)  UED Upper Endoscopy w/DIL                             DONE     [facility]     [address] [place], [place]  [address]           ENDOSCOPY PROCEDURE REPORT           PATIENT:  [name], [name]  MR#:  [id number]     BIRTHDATE:  07-Apr-1924, 85 yrs. old  GENDER:  male           ENDOSCOPIST:  Barbette Hair. Arlyce Dice, MD     ASSISTANT:           PROCEDURE DATE:  05/12/2009     PROCEDURE:  EGD with balloon dilatation     ASA CLASS:  Class III     INDICATIONS:  1) dysphagia h/o esophageal stricture           MEDICATIONS:   Fentanyl 25 mcg IV, Versed 4 mg IV, glycopyrrolate     (Robinal) 0.2 mg IV     TOPICAL ANESTHETIC:  Cetacaine Spray           DESCRIPTION OF PROCEDURE:   After the risks benefits and     alternatives of the procedure were thoroughly explained, informed     consent was obtained.  The EG-[id number] ([id number]) endoscope was     introduced through the mouth and advanced to the third portion of     the duodenum, without limitations.  The instrument was slowly     withdrawn as the mucosa was carefully examined.     <<PROCEDUREIMAGES>>           A stricture was found at the gastroesophageal junction (see     image001). Early esophageal stricture  A stricture was found in     the proximal esophagus. Moderately severe stricture in area of     UES, approximately 20cm from incisors. Scope passed with     resistance (see image003).  other findings.    Dilation was then     performed at the proximal esophagus           1) Dilator:  Balloon  Size(s):  15-16.5-18     Resistance:  minimal  Heme:  none     Appearance:     Minimal resistance to 18mm dilator 2) Dilator:  Balloon  Size(s):     11-25-10     Resistance:  moderate  Heme:  yes     Appearance:     Minimal heme           COMPLICATIONS:  None        ENDOSCOPIC IMPRESSION:     1) Stricture at the gastroesophageal junction     2) Stricture in the proximal esophagus     RECOMMENDATIONS:     1) call office to schedule an office visit for _4weeks           REPEAT EXAM:  No           ______________________________     Barbette Hair. Arlyce Dice, MD           CC:  Doneen Poisson, M.D.           n.  eSIGNED:   Barbette Hair. Glenna Brunkow at 05/12/2009 09:05 AM           Ernest Pine, [id number]  Note: An exclamation mark (!) indicates a result that was not dispersed into the flowsheet. Document Creation Date: 05/12/2009 9:06 AM _______________________________________________________________________  (1) Order result status: Final Collection or observation date-time: 05/12/2009 08:53 Requested date-time:  Receipt date-time:  Reported date-time:  Referring Physician:   Ordering Physician: Melvia Heaps [phone]) Specimen Source:  Source: Launa Grill Order Number: [id number] Lab site:

## 2010-03-16 NOTE — Letter (Signed)
Summary: EGD Instructions  [facility]  [address] [place], [place] [address]   Phone: [phone]  Fax: [phone]       KEIDEN DESKIN    [birth year]    MRN: [id number]       Procedure Day /Date:07/03/2009 FRIDAY     Arrival Time: 2PM     Procedure Time:3PM     Location of Procedure:                    X  [facility] (4th Floor)   PREPARATION FOR ENDOSCOPY/dil   On 07/03/2009 THE DAY OF THE PROCEDURE:  1.   No solid foods, milk or milk products are allowed after midnight the night before your procedure.  2.   Do not drink anything colored red or purple.  Avoid juices with pulp.  No orange juice.  3.  You may drink clear liquids until1PM, which is 2 hours before your procedure.                                                                                                CLEAR LIQUIDS INCLUDE: Water Jello Ice Popsicles Tea (sugar ok, no milk/cream) Powdered fruit flavored drinks Coffee (sugar ok, no milk/cream) Gatorade Juice: apple, white grape, white cranberry  Lemonade Clear bullion, consomm, broth Carbonated beverages (any kind) Strained chicken noodle soup Hard Candy   MEDICATION INSTRUCTIONS  Unless otherwise instructed, you should take regular prescription medications with a small sip of water as early as possible the morning of your procedure.   Stop taking Plavix or Aggrenox on  06/26/2009  (7 days before procedure).    PER DR Arlyce Dice   Additional medication instructions: _             OTHER INSTRUCTIONS  You will need a responsible adult at least 75 years of age to accompany you and drive you home.   This person must remain in the waiting room during your procedure.  Wear loose fitting clothing that is easily removed.  Leave jewelry and other valuables at home.  However, you may wish to bring a book to read or an iPod/MP3 player to listen to music as you wait for your procedure to start.  Remove all body  piercing jewelry and leave at home.  Total time from sign-in until discharge is approximately 2-3 hours.  You should go home directly after your procedure and rest.  You can resume normal activities the day after your procedure.  The day of your procedure you should not:   Drive   Make legal decisions   Operate machinery   Drink alcohol   Return to work  You will receive specific instructions about eating, activities and medications before you leave.    The above instructions have been reviewed and explained to me by   _______________________    I fully understand and can verbalize these instructions _____________________________ Date _________      Appended Document: EGD Instructions Changed procedure to hospital case. Scheduled EGD W/Balloon to [facility] ENDO on 07/03/2009 at 12:30. Pt will need to  be NPO 4 hours instead of 2

## 2010-03-16 NOTE — Assessment & Plan Note (Signed)
Summary: NEW PT TO EST/CLE   Vital Signs:  Patient profile:   77 year old male Weight:      133 pounds Temp:     98.1 degrees F oral Pulse rate:   68 / minute Pulse rhythm:   regular BP sitting:   148 / 60  (left arm) Cuff size:   large  Vitals Entered By: Mervin Hack CMA Duncan Dull) (July 15, 2009 11:56 AM) CC: new patient to establish care   History of Present Illness: transferring care from [facility] sees Dr Myrtis Ser for cardiology   History of hypertension Decades old controlled on meds  On chol meds for many years as well no history of coronary ischemia  Severe carotid [facility] Ser and has appt set up with Dr Edilia Bo  Had reflux disease No dysphagia since last esophageal dilatation had one done recently by Dr Arlyce Dice  had fairly classic right 1st MTP pain consistent with gout about 1 month ago took tylenol and it did improve  Allergies: 1)  ! Atenolol 2)  ! * Diltiazem 3)  ! Iron 4)  ! Hydrochlorothiazide 5)  ! * Labetalol 6)  ! Niacin 7)  ! * Terazosin 8)  ! * Fleodapine 9)  ! * Fluvastatin 10)  ! Lovastatin 11)  ! Simvastatin  Past History:  Family History: Last updated: 04/28/2009 Family History of Prostate Cancer:Father Family History of Heart Disease:Brothers x2  No FH of Colon Cancer:  Social History: Last updated: 07/15/2009 Married  3 daughters Tobacco Use - Former. -- 84 Occupation: Retired Doctor, hospital Alcohol Use - no Illicit Drug Use - no  Has living will Request wife, then oldest daughter to make health care decisions for him Luster Landsberg) Will accept resuscitation but no prolonged mechanical ventilation Not sure about tube feeds, might accept  Past Medical History: Community-acquired pneumonia   hospital ....04/07/2009 Hypertension Hyperlipidemia Anemia of chronic disease   Hgb 8...transfusion hospital 04/2009 CHF.Marland Kitchenwhile in hospital 04/07/2009--- EF  45%...echo...04/08/2009 Esophageal stricture....dilitation  at [facility] ,.then Dr Arlyce Dice CKD  II Carotid artery disease total right carotid, 50% left carotid by report from the patient GERD Gout Impaired fasting glucose Benign prostatic hypertrophy  Past Surgical History: Cataract Extraction Hernia Surgery Tonsillectomy Community acquired pneumonia/CHF  2/11  Social History: Married  3 daughters Tobacco Use - Former. -- 38 Occupation: Retired Doctor, hospital Alcohol Use - no Illicit Drug Use - no  Has living will Request wife, then oldest daughter to make health care decisions for him Luster Landsberg) Will accept resuscitation but no prolonged mechanical ventilation Not sure about tube feeds, might accept  Review of Systems General:  Denies sleep disorder; Lost weight after hospitalization in February and has gained back 8# Wears seat belt. Eyes:  Complains of blurring; denies double vision and vision loss-1 eye; right eye is blurry. ENT:  Complains of ringing in ears; denies decreased hearing; Full dentures top and bottom. CV:  Complains of shortness of breath with exertion; denies chest pain or discomfort, difficulty breathing at night, difficulty breathing while lying down, fainting, lightheadness, and palpitations; Stable DOE--- can walk about 2 blocks before needing to rest. Resp:  Denies cough and shortness of breath. GI:  Denies abdominal pain, bloody stools, change in bowel habits, and dark tarry stools. GU:  Complains of nocturia and urinary hesitancy; dribbling most noticeably at night Nocturia x 2-3  occ mild daytime problems. MS:  Complains of joint pain and low back pain; denies joint swelling; occ  right hand pain--not limiting. Derm:  Denies lesion(s) and rash. Neuro:  Denies headaches, numbness, tingling, and weakness; no recent headaches. Psych:  Complains of anxiety; denies depression; Occ anxiety spells---resolved on their own within  hours. Heme:  Denies abnormal bruising and enlarge lymph nodes. Allergy:  Complains of seasonal  allergies and sneezing; no meds for allergies.  Physical Exam  General:  alert and normal appearance.   Eyes:  pupils equal, pupils round, and pupils reactive to light.   Mouth:  no erythema and no lesions.   Neck:  supple, no masses, no thyromegaly, and no cervical lymphadenopathy.  Loud right carotid bruit Lungs:  normal respiratory effort and normal breath sounds.   Heart:  normal rate, regular rhythm, no murmur, and no gallop.   Abdomen:  soft, non-tender, and no masses.   Msk:  no joint tenderness and no joint swelling.   Pulses:  faint on right  not palpable on left Extremities:  trace edema at right ankle only Neurologic:  alert & oriented X3 and strength normal in all extremities.   Skin:  no rashes and no suspicious lesions.   Axillary Nodes:  No palpable lymphadenopathy Psych:  normally interactive, good eye contact, not anxious appearing, and not depressed appearing.     Impression & Recommendations:  Problem # 1:  ESSENTIAL HYPERTENSION, BENIGN (ICD-401.1) Assessment Unchanged  reasonable control no changes for now may want to push dose of lisinopril  His updated medication list for this problem includes:    Furosemide 40 Mg Tabs (Furosemide) .Marland Kitchen... Take 1 tablet by mouth once a day    Lisinopril 5 Mg Tabs (Lisinopril) .Marland Kitchen... Take 1 tablet by mouth once a day    Carvedilol 6.25 Mg Tabs (Carvedilol) .Marland Kitchen... Take 1 tablet by mouth two times a day  BP today: 148/60 Prior BP: 148/60 (07/10/2009)  Labs Reviewed: K+: 4.6 (05/14/2009) Creat: : 1.31 (05/14/2009)   Chol: 160 (04/30/2009)   HDL: 88 (04/30/2009)   LDL: 63 (04/30/2009)   TG: 44 (04/30/2009)  Orders: TLB-Renal Function Panel (80069-RENAL) TLB-CBC Platelet - w/Differential (85025-CBCD) TLB-TSH (Thyroid Stimulating Hormone) (84443-TSH)  Problem # 2:  CHRONIC COMB SYSTOLIC&DIASTOLIC HEART FAILURE (ICD-428.42) Assessment: Comment Only seems to have mixed disease from history I can review Borderline LV  systolic function probably some diastolic dysfunction stable NYHA class 2 symptoms  His updated medication list for this problem includes:    Plavix 75 Mg Tabs (Clopidogrel bisulfate) .Marland Kitchen... Take 1 tablet by mouth once a day    Furosemide 40 Mg Tabs (Furosemide) .Marland Kitchen... Take 1 tablet by mouth once a day    Aspir-low 81 Mg Tbec (Aspirin) .Marland Kitchen... Take 1 tablet by mouth once a day    Lisinopril 5 Mg Tabs (Lisinopril) .Marland Kitchen... Take 1 tablet by mouth once a day    Carvedilol 6.25 Mg Tabs (Carvedilol) .Marland Kitchen... Take 1 tablet by mouth two times a day  Problem # 3:  HYPERLIPIDEMIA (ICD-272.4) Assessment: Unchanged  due for labs  His updated medication list for this problem includes:    Pravastatin Sodium 40 Mg Tabs (Pravastatin sodium) .Marland Kitchen... Take 1 tablet by mouth once a day  Orders: TLB-Lipid Panel (80061-LIPID) TLB-Hepatic/Liver Function Pnl (80076-HEPATIC) Venipuncture (62130)  Problem # 4:  BENIGN PROSTATIC HYPERTROPHY (ICD-600.00) Assessment: Unchanged moderate symptoms but satisfied for now will start tamsulosin as needed   Problem # 5:  GERD (ICD-530.81) Assessment: Comment Only symptoms controlled now  His updated medication list for this problem includes:    Omeprazole 20 Mg Cpdr (  Omeprazole) .Marland Kitchen... Take 1 tablet by mouth once a day  Problem # 6:  GOUT (ICD-274.9) Assessment: Comment Only apparent diagnosis will check uric acid level  Complete Medication List: 1)  Omeprazole 20 Mg Cpdr (Omeprazole) .... Take 1 tablet by mouth once a day 2)  Plavix 75 Mg Tabs (Clopidogrel bisulfate) .... Take 1 tablet by mouth once a day 3)  Furosemide 40 Mg Tabs (Furosemide) .... Take 1 tablet by mouth once a day 4)  Aspir-low 81 Mg Tbec (Aspirin) .... Take 1 tablet by mouth once a day 5)  Lisinopril 5 Mg Tabs (Lisinopril) .... Take 1 tablet by mouth once a day 6)  Pravastatin Sodium 40 Mg Tabs (Pravastatin sodium) .... Take 1 tablet by mouth once a day 7)  Carvedilol 6.25 Mg Tabs (Carvedilol)  .... Take 1 tablet by mouth two times a day 8)  Vitamin C 500 Mg Tabs (Ascorbic acid) .... Once daily 9)  Vitamin B Complex-c Caps (B complex-c) .... Once daily 10)  Vitamin D 400 Unit Tabs (Cholecalciferol) .... Once daily  Other Orders: TLB-A1C / Hgb A1C (Glycohemoglobin) (83036-A1C) TD Toxoids IM 7 YR + (62130) Admin 1st Vaccine (86578)  Patient Instructions: 1)  Please schedule a follow-up appointment in 6 months .   Current Allergies (reviewed today): ! ATENOLOL ! * DILTIAZEM ! IRON ! HYDROCHLOROTHIAZIDE ! * LABETALOL ! NIACIN ! * TERAZOSIN ! * FLEODAPINE ! * FLUVASTATIN ! LOVASTATIN ! SIMVASTATIN   Immunizations Administered:  Tetanus Vaccine:    Vaccine Type: Td    Site: left deltoid    Mfr: Sanofi Pasteur    Dose: 0.5 ml    Route: IM    Given by: Mervin Hack CMA (AAMA)    Exp. Date: 12/30/2010    Lot #: [id number]    VIS given: 01/02/07 version given July 15, 2009.

## 2010-03-16 NOTE — Letter (Signed)
Summary: New Patient letter  [facility]  [address] [place], [place] [address]   Phone: [phone]  Fax: [phone]       04/14/2009 MRN: [id number]  Anthony Werner [address] [place], [place]  [address]  Dear Anthony Werner,  Welcome to the Gastroenterology Division at [facility].    You are scheduled to see Dr. Arlyce Dice on 04-28-09 at 10:45a.m. on the 3rd floor at [facility], [address]  We ask that you try to arrive at our office 15 minutes prior to your appointment time to allow for check-in.  We would like you to complete the enclosed self-administered evaluation form prior to your visit and bring it with you on the day of your appointment.  We will review it with you.  Also, please bring a complete list of all your medications or, if you prefer, bring the medication bottles and we will list them.  Please bring your insurance card so that we may make a copy of it.  If your insurance requires a referral to see a specialist, please bring your referral form from your primary care physician.  Co-payments are due at the time of your visit and may be paid by cash, check or credit card.     Your office visit will consist of a consult with your physician (includes a physical exam), any laboratory testing he/she may order, scheduling of any necessary diagnostic testing (e.g. x-ray, ultrasound, CT-scan), and scheduling of a procedure (e.g. Endoscopy, Colonoscopy) if required.  Please allow enough time on your schedule to allow for any/all of these possibilities.    If you cannot keep your appointment, please call [phone] to cancel or reschedule prior to your appointment date.  This allows [place] the opportunity to schedule an appointment for another patient in need of care.  If you do not cancel or reschedule by 5 p.m. the business day prior to your appointment date, you will be charged a $50.00 late cancellation/no-show fee.    Thank you for choosing   Gastroenterology for your medical needs.  We appreciate the opportunity to care for you.  Please visit [place] at our website  to learn more about our practice.                     Sincerely,                                                             The Gastroenterology Division

## 2010-03-16 NOTE — Assessment & Plan Note (Signed)
Summary: EST-CK/FU/MEDS/CFB   Vital Signs:  Patient profile:   77 year old male Height:      67 inches (170.18 cm) Weight:      126.4 pounds (57.45 kg) BMI:     19.87 Temp:     96.6 degrees F (35.89 degrees C) oral Pulse rate:   65 / minute BP sitting:   118 / 54  (right arm) Cuff size:   regular  Vitals Entered By: Theotis Barrio NT II (April 30, 2009 1:51 PM) Is Patient Diabetic? No Pain Assessment Patient in pain? no       Have you ever been in a relationship where you felt threatened, hurt or afraid?No   Does patient need assistance? Functional Status Self care Ambulation Normal   Primary Care Provider:  [facility]   History of Present Illness: 77 y/o AA man with recent hospitaliztion for CHF and pneumonia comes to the clinic for a follow up visit. No complaints.   CHF- breathing ok. No orthopnea, no leg swelling, no CP. Schedule for [facility] card visit in April 2011 Pneumonia- completed avelox course. No SOB or fever or chills Anemia- no apperent GI bleeed. No dizziness, SOB or other complaints.  Stricture- Scheduled for EGD on 05/06/2009   Preventive Screening-Counseling & Management  Alcohol-Tobacco     Smoking Status: quit  Current Medications (verified): 1)  Omeprazole 20 Mg Cpdr (Omeprazole) .... Take 1 Tablet By Mouth Once A Day 2)  Plavix 75 Mg Tabs (Clopidogrel Bisulfate) .... Take 1 Tablet By Mouth Once A Day 3)  Furosemide 40 Mg Tabs (Furosemide) .... Take 1 Tablet By Mouth Once A Day 4)  Aspir-Low 81 Mg Tbec (Aspirin) .... Take 1 Tablet By Mouth Once A Day 5)  Lisinopril 2.5 Mg Tabs (Lisinopril) .... Take 1 Tablet By Mouth Two Times A Day 6)  Pravastatin Sodium 40 Mg Tabs (Pravastatin Sodium) .... Take 1 Tablet By Mouth Once A Day 7)  Carvedilol 6.25 Mg Tabs (Carvedilol) .... Take 1 Tablet By Mouth Two Times A Day  Allergies (verified): 1)  ! Atenolol 2)  ! * Diltiazem 3)  ! Iron 4)  ! Hydrochlorothiazide 5)  ! * Labetalol 6)  ! Niacin 7)  ! *  Omeprazole 8)  ! * Terazosin 9)  ! * Feladipine 10)  ! * Flurastatin 11)  ! Lovastatin 12)  ! Simvastatin  Review of Systems  The patient denies anorexia, fever, weight loss, weight gain, vision loss, decreased hearing, hoarseness, chest pain, syncope, dyspnea on exertion, peripheral edema, prolonged cough, headaches, hemoptysis, abdominal pain, melena, hematochezia, severe indigestion/heartburn, hematuria, incontinence, genital sores, muscle weakness, suspicious skin lesions, transient blindness, difficulty walking, depression, unusual weight change, abnormal bleeding, enlarged lymph nodes, angioedema, breast masses, and testicular masses.    Physical Exam  General:  alert, underweight appearing, and pale.   Head:  normocephalic and atraumatic.   Eyes:  vision grossly intact, pupils equal, pupils round, and pupils reactive to light.   Ears:  R ear normal and L ear normal.   Nose:  no external deformity.   Mouth:  good dentition.   Neck:  supple, full ROM, no masses, and no JVD.   Lungs:  normal respiratory effort, no intercostal retractions, no accessory muscle use, normal breath sounds, no dullness, no crackles, and no wheezes.   Heart:  normal rate, regular rhythm, no murmur, and no gallop.   Abdomen:  soft, non-tender, normal bowel sounds, no distention, and no masses.   Pulses:  dorsalis pedis pulses normal bilaterally  Extremities:  no edema, no calf tenderness Neurologic:  OrientedX3, cranial nerver 2-12 intact,strength good in all extremities, sensations normal to light touch, reflexes 2+ b/l, gait normal    Impression & Recommendations:  Problem # 1:  CHF (ICD-428.0) Resolved. Continue current dose of lasix. Follwing [facility].   His updated medication list for this problem includes:    Plavix 75 Mg Tabs (Clopidogrel bisulfate) .Marland Kitchen... Take 1 tablet by mouth once a day    Furosemide 40 Mg Tabs (Furosemide) .Marland Kitchen... Take 1 tablet by mouth once a day    Aspir-low 81 Mg Tbec  (Aspirin) .Marland Kitchen... Take 1 tablet by mouth once a day    Lisinopril 2.5 Mg Tabs (Lisinopril) .Marland Kitchen... Take 1 tablet by mouth two times a day    Carvedilol 6.25 Mg Tabs (Carvedilol) .Marland Kitchen... Take 1 tablet by mouth two times a day  Problem # 2:  ESOPHAGEAL STRICTURE (ICD-530.3) Scheduled for eso dilation on 05/12/2009. Able to eat regualr diet and swallow pills. No pain while swallowing. Feels a lottle discomfort which is managable for him.   Problem # 3:  ANEMIA OF OTHER CHRONIC DISEASE (ICD-285.29) Will recheck CBC. The thought during discharge was that his Anemia panel is cosnsistent with ACD and that it is probably from myelosuppresion from his age and CKD. His FOBT was negative so no furhter workup was done. He said he had a normal colonoscopy 4 rs ago at [facility] hospital. Will still try to get records of the colonoscopy and the PET scan he had for BPH related symtoms to rule out malignancy.    Orders: T-CBC w/Diff ([id number])  Problem # 4:  HYPERLIPIDEMIA (ICD-272.4) Will check Lipid profile in May 2011.  His updated medication list for this problem includes:    Pravastatin Sodium 40 Mg Tabs (Pravastatin sodium) .Marland Kitchen... Take 1 tablet by mouth once a day  Problem # 5:  ATHEROSCLEROTIC CARDIOVASCULAR DISEASE (ICD-429.2) No CVsComplaints. Follows [facility].   His updated medication list for this problem includes:    Plavix 75 Mg Tabs (Clopidogrel bisulfate) .Marland Kitchen... Take 1 tablet by mouth once a day    Furosemide 40 Mg Tabs (Furosemide) .Marland Kitchen... Take 1 tablet by mouth once a day    Aspir-low 81 Mg Tbec (Aspirin) .Marland Kitchen... Take 1 tablet by mouth once a day    Lisinopril 2.5 Mg Tabs (Lisinopril) .Marland Kitchen... Take 1 tablet by mouth two times a day    Carvedilol 6.25 Mg Tabs (Carvedilol) .Marland Kitchen... Take 1 tablet by mouth two times a day  Problem # 6:  CHRONIC KIDNEY DISEASE STAGE III (MODERATE) (ICD-585.3) Will check BMET. Cause is probable HTN. No DM.   Problem # 7:  ESSENTIAL HYPERTENSION, BENIGN (ICD-401.1) Well  controlled on current regimen.   His updated medication list for this problem includes:    Furosemide 40 Mg Tabs (Furosemide) .Marland Kitchen... Take 1 tablet by mouth once a day    Lisinopril 2.5 Mg Tabs (Lisinopril) .Marland Kitchen... Take 1 tablet by mouth two times a day    Carvedilol 6.25 Mg Tabs (Carvedilol) .Marland Kitchen... Take 1 tablet by mouth two times a day  Orders: T-Basic Metabolic Panel [id number])  Problem # 8:  Preventive Health Care (ICD-V70.0) pneumonia shot today. Had flu shot in the hospital. Will get colonoscopy records from [place]. Tetanus refused.   Complete Medication List: 1)  Omeprazole 20 Mg Cpdr (Omeprazole) .... Take 1 tablet by mouth once a day 2)  Plavix 75 Mg Tabs (Clopidogrel bisulfate) .... Take  1 tablet by mouth once a day 3)  Furosemide 40 Mg Tabs (Furosemide) .... Take 1 tablet by mouth once a day 4)  Aspir-low 81 Mg Tbec (Aspirin) .... Take 1 tablet by mouth once a day 5)  Lisinopril 2.5 Mg Tabs (Lisinopril) .... Take 1 tablet by mouth two times a day 6)  Pravastatin Sodium 40 Mg Tabs (Pravastatin sodium) .... Take 1 tablet by mouth once a day 7)  Carvedilol 6.25 Mg Tabs (Carvedilol) .... Take 1 tablet by mouth two times a day  Other Orders: Pneumococcal Vaccine ([address])  Patient Instructions: 1)  Please schedule a follow-up appointment in 2-3 weeks. Prescriptions: LISINOPRIL 2.5 MG TABS (LISINOPRIL) Take 1 tablet by mouth two times a day  #30 x 3   Entered and Authorized by:   Bethel Born MD   Signed by:   Bethel Born MD on 04/30/2009   Method used:   Electronically to        [facility] [phone]* (retail)       [address]       [place], [place]  [address]       Ph: [phone]       Fax: [phone]   RxID:   [id number] PRAVASTATIN SODIUM 40 MG TABS (PRAVASTATIN SODIUM) Take 1 tablet by mouth once a day  #30 x 3   Entered and Authorized by:   Bethel Born MD   Signed by:   Bethel Born MD on 04/30/2009   Method used:   Electronically to        [facility] [phone]* (retail)       [address]       [place], [place]  [address]       Ph: [phone]       Fax: [phone]   RxID:   [id number] CARVEDILOL 6.25 MG TABS (CARVEDILOL) Take 1 tablet by mouth two times a day  #30 x 3   Entered and Authorized by:   Bethel Born MD   Signed by:   Bethel Born MD on 04/30/2009   Method used:   Electronically to        [facility] [phone]* (retail)       [address]       [place], [place]  [address]       Ph: [phone]       Fax: [phone]   RxID:   [id number] FUROSEMIDE 40 MG TABS (FUROSEMIDE) Take 1 tablet by mouth once a day  #30 x 3   Entered and Authorized by:   Bethel Born MD   Signed by:   Bethel Born MD on 04/30/2009   Method used:   Electronically to        [facility] [phone]* (retail)       [address]       [place], [place]  [address]       Ph: [phone]       Fax: [phone]   RxID:   [id number] PLAVIX 75 MG TABS (CLOPIDOGREL BISULFATE) Take 1 tablet by mouth once a day  #30 x 3   Entered and Authorized by:   Bethel Born MD   Signed by:   Bethel Born MD on 04/30/2009   Method used:   Electronically to        [facility] [phone]* (retail)       [address]       [place], [place]  [address]  Ph: [phone]       Fax: [phone]   RxID:   [id number]  Process Orders Check Orders Results:     [facility]: Check successful Tests Sent for requisitioning (April 30, 2009 9:24 PM):     04/30/2009: [facility] -- T-Basic Metabolic Panel [id number] (signed)     04/30/2009: [facility] -- T-CBC w/Diff [[id number]] (signed)   Prevention & Chronic Care Immunizations   Influenza vaccine: Not documented   Influenza vaccine due: 10/16/2010    Tetanus booster: Not documented   Td booster deferral: Deferred  (04/30/2009)    Pneumococcal vaccine: Pneumovax  (04/30/2009)    H. zoster vaccine: Not  documented   H. zoster vaccine deferral: Refused  (04/30/2009)  Colorectal Screening   Hemoccult: Not documented   Hemoccult action/deferral: Deferred  (04/30/2009)    Colonoscopy: Not documented  Other Screening   PSA: Not documented   Smoking status: quit  (04/30/2009)  Lipids   Total Cholesterol: 160  (04/30/2009)   Lipid panel action/deferral: Deferred   LDL: 63  (04/30/2009)   LDL Direct: Not documented   HDL: 88  (04/30/2009)   Triglycerides: 44  (04/30/2009)    SGOT (AST): Not documented   BMP action: Deferred   SGPT (ALT): Not documented   Alkaline phosphatase: Not documented   Total bilirubin: Not documented    Lipid flowsheet reviewed?: Yes   Progress toward LDL goal: At goal  Hypertension   Last Blood Pressure: 118 / 54  (04/30/2009)   Serum creatinine: 1.27  (04/30/2009)   Serum potassium 4  (04/30/2009)    Hypertension flowsheet reviewed?: Yes   Progress toward BP goal: Improved  Self-Management Support :   Personal Goals (by the next clinic visit) :      Personal blood pressure goal: 130/80  (04/30/2009)     Personal LDL goal: 100  (04/30/2009)    Hypertension self-management support: Written self-care plan  (04/30/2009)   Hypertension self-care plan printed.    Lipid self-management support: Written self-care plan  (04/30/2009)   Lipid self-care plan printed.   Nursing Instructions: Give Pneumovax today     Pneumovax Vaccine    Vaccine Type: Pneumovax    Site: left deltoid    Mfr: Merck    Dose: 0.5 ml    Route: IM    Given by: Stanton Kidney Ditzler RN    Exp. Date: 09/28/2010    Lot #: [id number]    VIS given: 09/12/95 version given April 30, 2009.

## 2010-03-24 ENCOUNTER — Encounter: Payer: Self-pay | Admitting: Cardiology

## 2010-03-24 ENCOUNTER — Ambulatory Visit (INDEPENDENT_AMBULATORY_CARE_PROVIDER_SITE_OTHER): Payer: Medicare Other | Admitting: Vascular Surgery

## 2010-03-24 DIAGNOSIS — I6529 Occlusion and stenosis of unspecified carotid artery: Secondary | ICD-10-CM

## 2010-03-26 NOTE — Assessment & Plan Note (Signed)
OFFICE VISIT  CARMEL, WADDINGTON DOB:  11/15/1924                                       03/24/2010 ZOXWR#:[id number]  I saw the patient in the office today for followup after his recent left carotid endarterectomy.  This is a pleasant 77 year old gentleman who I have been following with a moderate left carotid stenosis and known right internal carotid artery occlusion.  The stenosis on the left progressed to greater than 80% and left carotid endarterectomy was recommended in order to lower his risk of future stroke.  He underwent left carotid endarterectomy with Dacron patch angioplasty on March 09, 2010.  He did well postoperatively and was discharged on postoperative day #1.  He returns for his first outpatient visit.  Overall, he has been doing quite well and has no specific complaints.  He had no focal weakness or paresthesias.  On physical examination, this is a pleasant 77 year old gentleman who appears stated age.  Blood pressure is 198/79, heart rate is 65, temperature is 98.4.  His left neck incision has healed nicely.  He has no focal neurologic findings.  Lungs are clear bilaterally to auscultation.  Overall, I am pleased with his progress and I will plan on seeing him back in 6 months.  I have ordered a followup carotid duplex scan at that time.  I have asked him to follow up with his primary care physician concerning his blood pressure which was a little bit high in the office today.  He knows to continue taking his aspirin.    Di Kindle. Edilia Bo, M.D. Electronically Signed  CSD/MEDQ  D:  03/24/2010  T:  03/25/2010  Job:  [id number]  cc:   Luis Abed, MD, [facility]

## 2010-04-07 NOTE — Progress Notes (Signed)
Summary: [facility]: Office Visit  [facility]: Office Visit   Imported By: Earl Many 03/30/2010 16:42:51  _____________________________________________________________________  External Attachment:    Type:   Image     Comment:   External Document

## 2010-05-05 LAB — DIFFERENTIAL
Basophils Absolute: 0 10*3/uL (ref 0.0–0.1)
Basophils Relative: 0 % (ref 0–1)
Basophils Relative: 0 % (ref 0–1)
Eosinophils Relative: 1 % (ref 0–5)
Lymphocytes Relative: 16 % (ref 12–46)
Lymphs Abs: 0.9 10*3/uL (ref 0.7–4.0)
Lymphs Abs: 1.2 10*3/uL (ref 0.7–4.0)
Monocytes Absolute: 0.8 10*3/uL (ref 0.1–1.0)
Monocytes Relative: 7 % (ref 3–12)
Monocytes Relative: 8 % (ref 3–12)
Neutro Abs: 5.7 10*3/uL (ref 1.7–7.7)

## 2010-05-05 LAB — URINALYSIS, ROUTINE W REFLEX MICROSCOPIC
Glucose, UA: NEGATIVE mg/dL
Ketones, ur: 15 mg/dL — AB
Nitrite: NEGATIVE
Specific Gravity, Urine: 1.015 (ref 1.005–1.030)
pH: 5 (ref 5.0–8.0)

## 2010-05-05 LAB — BASIC METABOLIC PANEL
BUN: 41 mg/dL — ABNORMAL HIGH (ref 6–23)
BUN: 47 mg/dL — ABNORMAL HIGH (ref 6–23)
BUN: 48 mg/dL — ABNORMAL HIGH (ref 6–23)
BUN: 48 mg/dL — ABNORMAL HIGH (ref 6–23)
BUN: 58 mg/dL — ABNORMAL HIGH (ref 6–23)
BUN: 62 mg/dL — ABNORMAL HIGH (ref 6–23)
CO2: 26 mEq/L (ref 19–32)
Calcium: 8.3 mg/dL — ABNORMAL LOW (ref 8.4–10.5)
Calcium: 8.6 mg/dL (ref 8.4–10.5)
Calcium: 8.8 mg/dL (ref 8.4–10.5)
Chloride: 100 mEq/L (ref 96–112)
Chloride: 101 mEq/L (ref 96–112)
Chloride: 102 mEq/L (ref 96–112)
Chloride: 104 mEq/L (ref 96–112)
Chloride: 106 mEq/L (ref 96–112)
Creatinine, Ser: 1.58 mg/dL — ABNORMAL HIGH (ref 0.4–1.5)
Creatinine, Ser: 1.85 mg/dL — ABNORMAL HIGH (ref 0.4–1.5)
GFR calc Af Amer: 43 mL/min — ABNORMAL LOW (ref >60)
GFR calc Af Amer: 52 mL/min — ABNORMAL LOW (ref >60)
GFR calc non Af Amer: 36 mL/min — ABNORMAL LOW (ref >60)
GFR calc non Af Amer: 38 mL/min — ABNORMAL LOW (ref >60)
GFR calc non Af Amer: 39 mL/min — ABNORMAL LOW (ref >60)
GFR calc non Af Amer: 43 mL/min — ABNORMAL LOW (ref >60)
GFR calc non Af Amer: 44 mL/min — ABNORMAL LOW (ref >60)
Glucose, Bld: 107 mg/dL — ABNORMAL HIGH (ref 70–99)
Glucose, Bld: 107 mg/dL — ABNORMAL HIGH (ref 70–99)
Glucose, Bld: 111 mg/dL — ABNORMAL HIGH (ref 70–99)
Glucose, Bld: 156 mg/dL — ABNORMAL HIGH (ref 70–99)
Potassium: 3.4 mEq/L — ABNORMAL LOW (ref 3.5–5.1)
Potassium: 3.5 mEq/L (ref 3.5–5.1)
Potassium: 3.6 mEq/L (ref 3.5–5.1)
Potassium: 3.9 mEq/L (ref 3.5–5.1)
Potassium: 4 mEq/L (ref 3.5–5.1)
Potassium: 4.2 mEq/L (ref 3.5–5.1)
Sodium: 133 mEq/L — ABNORMAL LOW (ref 135–145)
Sodium: 134 mEq/L — ABNORMAL LOW (ref 135–145)
Sodium: 138 mEq/L (ref 135–145)

## 2010-05-05 LAB — CBC
HCT: 21.2 % — ABNORMAL LOW (ref 39.0–52.0)
HCT: 21.2 % — ABNORMAL LOW (ref 39.0–52.0)
HCT: 23 % — ABNORMAL LOW (ref 39.0–52.0)
HCT: 23.7 % — ABNORMAL LOW (ref 39.0–52.0)
HCT: 24.1 % — ABNORMAL LOW (ref 39.0–52.0)
HCT: 24.6 % — ABNORMAL LOW (ref 39.0–52.0)
HCT: 25 % — ABNORMAL LOW (ref 39.0–52.0)
Hemoglobin: 7.4 g/dL — ABNORMAL LOW (ref 13.0–17.0)
Hemoglobin: 8.4 g/dL — ABNORMAL LOW (ref 13.0–17.0)
Hemoglobin: 8.5 g/dL — ABNORMAL LOW (ref 13.0–17.0)
Hemoglobin: 8.9 g/dL — ABNORMAL LOW (ref 13.0–17.0)
MCHC: 34.8 g/dL (ref 30.0–36.0)
MCHC: 34.8 g/dL (ref 30.0–36.0)
MCV: 97.3 fL (ref 78.0–100.0)
MCV: 97.6 fL (ref 78.0–100.0)
MCV: 98.2 fL (ref 78.0–100.0)
MCV: 99.3 fL (ref 78.0–100.0)
Platelets: 158 10*3/uL (ref 150–400)
Platelets: 164 10*3/uL (ref 150–400)
Platelets: 169 10*3/uL (ref 150–400)
Platelets: 191 10*3/uL (ref 150–400)
RBC: 2.44 MIL/uL — ABNORMAL LOW (ref 4.22–5.81)
RBC: 2.44 MIL/uL — ABNORMAL LOW (ref 4.22–5.81)
RBC: 2.47 MIL/uL — ABNORMAL LOW (ref 4.22–5.81)
RBC: 2.62 MIL/uL — ABNORMAL LOW (ref 4.22–5.81)
RDW: 13 % (ref 11.5–15.5)
RDW: 13.4 % (ref 11.5–15.5)
RDW: 13.7 % (ref 11.5–15.5)
RDW: 13.8 % (ref 11.5–15.5)
RDW: 13.8 % (ref 11.5–15.5)
WBC: 10.2 10*3/uL (ref 4.0–10.5)
WBC: 10.4 10*3/uL (ref 4.0–10.5)
WBC: 10.6 10*3/uL — ABNORMAL HIGH (ref 4.0–10.5)
WBC: 8 10*3/uL (ref 4.0–10.5)
WBC: 8.9 10*3/uL (ref 4.0–10.5)
WBC: 9.4 10*3/uL (ref 4.0–10.5)

## 2010-05-05 LAB — IRON AND TIBC: UIBC: 199 ug/dL

## 2010-05-05 LAB — COMPREHENSIVE METABOLIC PANEL
ALT: 11 U/L (ref 0–53)
AST: 18 U/L (ref 0–37)
CO2: 18 mEq/L — ABNORMAL LOW (ref 19–32)
Chloride: 103 mEq/L (ref 96–112)
Creatinine, Ser: 2.06 mg/dL — ABNORMAL HIGH (ref 0.4–1.5)
GFR calc Af Amer: 38 mL/min — ABNORMAL LOW (ref >60)
GFR calc non Af Amer: 31 mL/min — ABNORMAL LOW (ref >60)
Glucose, Bld: 140 mg/dL — ABNORMAL HIGH (ref 70–99)
Sodium: 133 mEq/L — ABNORMAL LOW (ref 135–145)
Total Bilirubin: 1.1 mg/dL (ref 0.3–1.2)

## 2010-05-05 LAB — CULTURE, RESPIRATORY W GRAM STAIN: Culture: NORMAL

## 2010-05-05 LAB — POCT I-STAT, CHEM 8
BUN: 55 mg/dL — ABNORMAL HIGH (ref 6–23)
Calcium, Ion: 1.06 mmol/L — ABNORMAL LOW (ref 1.12–1.32)
Chloride: 111 mEq/L (ref 96–112)
Creatinine, Ser: 2.4 mg/dL — ABNORMAL HIGH (ref 0.4–1.5)
Glucose, Bld: 153 mg/dL — ABNORMAL HIGH (ref 70–99)
TCO2: 19 mmol/L (ref 0–100)

## 2010-05-05 LAB — EXPECTORATED SPUTUM ASSESSMENT W GRAM STAIN, RFLX TO RESP C

## 2010-05-05 LAB — POCT I-STAT 3, ART BLOOD GAS (G3+)
Acid-base deficit: 2 mmol/L (ref 0.0–2.0)
Bicarbonate: 20.6 mEq/L (ref 20.0–24.0)
O2 Saturation: 100 %
Patient temperature: 97.9
pO2, Arterial: 218 mmHg — ABNORMAL HIGH (ref 80.0–100.0)

## 2010-05-05 LAB — LACTATE DEHYDROGENASE: LDH: 177 U/L (ref 94–250)

## 2010-05-05 LAB — LIPID PANEL
Cholesterol: 160 mg/dL (ref 0–200)
HDL: 88 mg/dL (ref >39)
LDL Cholesterol: 63 mg/dL (ref 0–99)
Total CHOL/HDL Ratio: 1.8 RATIO
VLDL: 9 mg/dL (ref 0–40)

## 2010-05-05 LAB — POCT CARDIAC MARKERS: Troponin i, poc: 0.16 ng/mL — ABNORMAL HIGH (ref 0.00–0.09)

## 2010-05-05 LAB — MRSA PCR SCREENING

## 2010-05-05 LAB — ABO/RH: ABO/RH(D): O POS

## 2010-05-05 LAB — BRAIN NATRIURETIC PEPTIDE: Pro B Natriuretic peptide (BNP): 1038 pg/mL — ABNORMAL HIGH (ref 0.0–100.0)

## 2010-05-05 LAB — TROPONIN I: Troponin I: 0.97 ng/mL (ref 0.00–0.06)

## 2010-05-05 LAB — CARDIAC PANEL(CRET KIN+CKTOT+MB+TROPI)
Relative Index: 2.8 — ABNORMAL HIGH (ref 0.0–2.5)
Troponin I: 1.22 ng/mL (ref 0.00–0.06)

## 2010-05-05 LAB — LEGIONELLA ANTIGEN, URINE: Legionella Antigen, Urine: NEGATIVE

## 2010-05-05 LAB — HAPTOGLOBIN: Haptoglobin: 474 mg/dL — ABNORMAL HIGH (ref 16–200)

## 2010-05-05 LAB — CULTURE, BLOOD (ROUTINE X 2)

## 2010-05-05 LAB — RAPID URINE DRUG SCREEN, HOSP PERFORMED
Amphetamines: NOT DETECTED
Barbiturates: NOT DETECTED
Opiates: NOT DETECTED

## 2010-05-05 LAB — VITAMIN B12: Vitamin B-12: 1297 pg/mL — ABNORMAL HIGH (ref 211–911)

## 2010-05-05 LAB — HEMOCCULT GUIAC POC 1CARD (OFFICE): Fecal Occult Bld: NEGATIVE

## 2010-05-05 LAB — CROSSMATCH: ABO/RH(D): O POS

## 2010-05-05 LAB — CK TOTAL AND CKMB (NOT AT ARMC): Total CK: 125 U/L (ref 7–232)

## 2010-05-05 LAB — FOLATE: Folate: >20 ng/mL

## 2010-05-05 LAB — TECHNOLOGIST SMEAR REVIEW: Path Review: ADEQUATE

## 2010-05-05 LAB — FERRITIN: Ferritin: 936 ng/mL — ABNORMAL HIGH (ref 22–322)

## 2010-05-05 LAB — CREATININE, URINE, RANDOM: Creatinine, Urine: 89.4 mg/dL

## 2010-05-05 LAB — TYPE AND SCREEN
ABO/RH(D): O POS
Antibody Screen: NEGATIVE

## 2010-05-05 LAB — HEMOGLOBIN A1C: Hgb A1c MFr Bld: 5.9 % (ref 4.6–6.1)

## 2010-05-05 LAB — SODIUM, URINE, RANDOM: Sodium, Ur: 67 mEq/L

## 2010-05-09 LAB — HEMOCCULT GUIAC POC 1CARD (OFFICE): Fecal Occult Bld: NEGATIVE

## 2010-05-09 LAB — CBC
Hemoglobin: 9 g/dL — ABNORMAL LOW (ref 13.0–17.0)
MCV: 96.1 fL (ref 78.0–100.0)
Platelets: 193 10*3/uL (ref 150–400)
Platelets: 232 10*3/uL (ref 150–400)
RBC: 2.47 MIL/uL — ABNORMAL LOW (ref 4.22–5.81)
RDW: 14.3 % (ref 11.5–15.5)
WBC: 7.7 10*3/uL (ref 4.0–10.5)
WBC: 7.9 10*3/uL (ref 4.0–10.5)

## 2010-05-09 LAB — BRAIN NATRIURETIC PEPTIDE: Pro B Natriuretic peptide (BNP): 1620 pg/mL — ABNORMAL HIGH (ref 0.0–100.0)

## 2010-05-09 LAB — BASIC METABOLIC PANEL
BUN: 55 mg/dL — ABNORMAL HIGH (ref 6–23)
Calcium: 8.8 mg/dL (ref 8.4–10.5)
Calcium: 9.2 mg/dL (ref 8.4–10.5)
Creatinine, Ser: 1.44 mg/dL (ref 0.4–1.5)
GFR calc Af Amer: 57 mL/min — ABNORMAL LOW (ref >60)
GFR calc non Af Amer: 47 mL/min — ABNORMAL LOW (ref >60)
GFR calc non Af Amer: 54 mL/min — ABNORMAL LOW (ref >60)
Glucose, Bld: 101 mg/dL — ABNORMAL HIGH (ref 70–99)
Sodium: 140 mEq/L (ref 135–145)

## 2010-05-11 ENCOUNTER — Telehealth: Payer: Self-pay | Admitting: Cardiology

## 2010-05-11 DIAGNOSIS — I10 Essential (primary) hypertension: Secondary | ICD-10-CM

## 2010-05-11 MED ORDER — LISINOPRIL 5 MG PO TABS: 5.0000 mg | ORAL_TABLET | Freq: Every day | ORAL | Status: DC

## 2010-05-11 NOTE — Telephone Encounter (Signed)
Refill med lisinopril 5 mg. [facility] on [address]

## 2010-05-11 NOTE — Telephone Encounter (Signed)
Lisinopril refilled.

## 2010-05-24 ENCOUNTER — Telehealth: Payer: Self-pay | Admitting: Cardiology

## 2010-05-25 NOTE — Telephone Encounter (Signed)
Pt's spouse was called and told to contact Dr Alphonsus Sias to receive this since he is the PCP.

## 2010-05-26 ENCOUNTER — Other Ambulatory Visit: Payer: Self-pay | Admitting: *Deleted

## 2010-05-26 MED ORDER — OMEPRAZOLE 20 MG PO CPDR: 20.0000 mg | DELAYED_RELEASE_CAPSULE | Freq: Every day | ORAL | Status: DC

## 2010-06-16 ENCOUNTER — Encounter: Payer: Self-pay | Admitting: Cardiology

## 2010-06-16 ENCOUNTER — Other Ambulatory Visit: Payer: Self-pay | Admitting: Cardiology

## 2010-06-22 ENCOUNTER — Encounter: Payer: Self-pay | Admitting: Cardiology

## 2010-06-22 DIAGNOSIS — E785 Hyperlipidemia, unspecified: Secondary | ICD-10-CM | POA: Insufficient documentation

## 2010-06-22 DIAGNOSIS — I10 Essential (primary) hypertension: Secondary | ICD-10-CM | POA: Insufficient documentation

## 2010-06-22 DIAGNOSIS — K219 Gastro-esophageal reflux disease without esophagitis: Secondary | ICD-10-CM | POA: Insufficient documentation

## 2010-06-22 DIAGNOSIS — I739 Peripheral vascular disease, unspecified: Secondary | ICD-10-CM

## 2010-06-22 DIAGNOSIS — R079 Chest pain, unspecified: Secondary | ICD-10-CM | POA: Insufficient documentation

## 2010-06-22 DIAGNOSIS — R9430 Abnormal result of cardiovascular function study, unspecified: Secondary | ICD-10-CM | POA: Insufficient documentation

## 2010-06-23 ENCOUNTER — Encounter: Payer: Self-pay | Admitting: Cardiology

## 2010-06-23 ENCOUNTER — Ambulatory Visit (INDEPENDENT_AMBULATORY_CARE_PROVIDER_SITE_OTHER): Payer: Medicare Other | Admitting: Cardiology

## 2010-06-23 DIAGNOSIS — I779 Disorder of arteries and arterioles, unspecified: Secondary | ICD-10-CM

## 2010-06-23 DIAGNOSIS — I509 Heart failure, unspecified: Secondary | ICD-10-CM

## 2010-06-23 DIAGNOSIS — I10 Essential (primary) hypertension: Secondary | ICD-10-CM

## 2010-06-23 DIAGNOSIS — R079 Chest pain, unspecified: Secondary | ICD-10-CM

## 2010-06-23 MED ORDER — LISINOPRIL 10 MG PO TABS: 10.0000 mg | ORAL_TABLET | Freq: Two times a day (BID) | ORAL | Status: DC

## 2010-06-23 NOTE — Patient Instructions (Signed)
Increase Lisinopril to 10mg  Twice daily  Your physician has requested that you regularly monitor and record your blood pressure readings at home. Please use the same machine at the same time of day to check your readings and record them to bring to your follow-up visit.  PLEASE GIVE [place] A CALL IN A WEEK OR 2 TO LET us KNOW HOW YOUR BP IS DOING, [phone] [address]. Your physician recommends that you schedule a follow-up appointment in: 4 weeks

## 2010-06-23 NOTE — Assessment & Plan Note (Signed)
Carotid artery disease has been treated with a recent endarterectomy

## 2010-06-23 NOTE — Assessment & Plan Note (Signed)
Blood pressure is elevated today.  We need better control.  We will start by increasing his lisinopril first.  There will then be  followup to see if he needs further adjustment

## 2010-06-23 NOTE — Assessment & Plan Note (Signed)
Volume status is stable. No change in therapy. 

## 2010-06-23 NOTE — Progress Notes (Signed)
HPI The patient is seen today for cardiology followup.  I saw him last November, 2011.  After that time he underwent left carotid endarterectomy successfully and he is doing very well.  He has some mild exertional shortness of breath.  There is no chest pain.  He has not been checking his blood pressure at home. Allergies  Allergen Reactions  . Atenolol   . Hydrochlorothiazide   . Iron   . Lovastatin   . Niacin   . Simvastatin     Current Outpatient Prescriptions  Medication Sig Dispense Refill  . Ascorbic Acid (VITAMIN C) 500 MG tablet Take 500 mg by mouth daily.        Marland Kitchen aspirin 81 MG tablet Take 81 mg by mouth daily.        Marland Kitchen b complex vitamins capsule Take 1 capsule by mouth daily.        . carvedilol (COREG) 6.25 MG tablet Take 6.25 mg by mouth 2 (two) times daily with a meal.        . furosemide (LASIX) 40 MG tablet Take 40 mg by mouth daily.       Marland Kitchen lisinopril (PRINIVIL,ZESTRIL) 5 MG tablet Take 1 tablet (5 mg total) by mouth daily.  30 tablet  11  . omeprazole (PRILOSEC) 20 MG capsule Take 20 mg by mouth 2 (two) times daily.        Marland Kitchen PLAVIX 75 MG tablet TAKE 1 TABLET BY MOUTH ONCE A DAY  90 tablet  1  . pravastatin (PRAVACHOL) 40 MG tablet Take 40 mg by mouth daily.       Marland Kitchen VITAMIN D, CHOLECALCIFEROL, PO Take 1 capsule by mouth daily.       Marland Kitchen DISCONTD: omeprazole (PRILOSEC) 20 MG capsule Take 1 capsule (20 mg total) by mouth daily.  90 capsule  3    History   Social History  . Marital Status: Married    Spouse Name: N/A    Number of Children: N/A  . Years of Education: N/A   Occupational History  . 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 Smoking status: Former Games developer  . Smokeless tobacco: Not on file  . Alcohol Use: No  . Drug Use: No  . Sexually Active: Not on file   Other Topics Concern  . Not on  file   Social History Narrative  . No narrative on file    Family History  Problem Relation Age of Onset  . Cancer Father     Prostate  . Heart disease Brother     x2    Past Medical History  Diagnosis Date  . Hypertension   . Hyperlipidemia   . Anemia of chronic disease 04/2009    hgb 8...transfusion 04/2009  . CHF (congestive heart failure) 04/07/09    CHF while in hospital February, 2011, EF 45%  . Esophageal stricture     Dilatation and VA, / dilatation later by Dr. Arlyce Dice  . CKD (chronic kidney disease), stage II   . Carotid artery disease     Total right carotid, 60-79% LICA Dr. Edilia Bo, July, 2011 / left carotid endarterectomy January, 2012     . Community acquired pneumonia   . GERD (gastroesophageal reflux disease)   . Gout   . BPH (benign prostatic hyperplasia)   . Impaired fasting glucose   .  Ejection fraction     45% by history  . Chest pain     Nuclear, April, 2011, no scar or ischemia    Past Surgical History  Procedure Date  . Cataract extraction   . Hernia repair   . Tonsillectomy     ROS  Patient denies fever, chills, headache, sweats, rash, change in vision, change in hearing, chest pain, cough, nausea vomiting, urinary symptoms.  All other systems are reviewed and are negative.  PHYSICAL EXAM Patient is oriented to person time and place.  Affect is normal.  He is here with his wife.  Head is atraumatic.  Her bilateral carotid bruits.  Lungs are clear.  Respiratory effort is nonlabored.  Cardiac exam reveals S1 and S2.  No clicks or significant murmurs.  The abdomen is soft.  There is no peripheral edema. Filed Vitals:   06/23/10 0912  BP: 180/63  Pulse: 54  Height: 5\' 7"  (1.702 m)  Weight: 137 lb (62.143 kg)    EKGEKG is done today and reviewed by me.  There is no significant change further sinus bradycardia.  ASSESSMENT & PLAN

## 2010-06-23 NOTE — Assessment & Plan Note (Signed)
No chest pain.  No further workup.

## 2010-06-29 NOTE — H&P (Signed)
NAME:  Anthony Werner, Anthony Werner NO.:  [id number]   MEDICAL RECORD NO.:  [id number]           PATIENT TYPE:   LOCATION:                                 FACILITY:   PHYSICIAN:  Angeline Slim, M.D.  DATE OF BIRTH:  05-04-1924   DATE OF ADMISSION:  DATE OF DISCHARGE:                              HISTORY & PHYSICAL   See dict [phone]   The patient was not truly admitted for this hospital stay.   Details can be found on the chart, progress note or on discharge  summary/dictation number [phone].      Angeline Slim, M.D.  Electronically Signed     AL/MEDQ  D:  02/02/2007  T:  02/04/2007  Job:  [id number]

## 2010-06-29 NOTE — Consult Note (Signed)
NEW PATIENT CONSULTATION   Anthony Werner, Anthony Werner  DOB:  1924/06/28                                       09/03/2009  ZOXWR#:[id number]   I saw the patient in the office today in consultation concerning his  carotid disease.  He was referred by Dr. Myrtis Ser.  This is a pleasant 77-  year-old right-handed gentleman who has been followed previously with  carotid disease at the [facility] in [place].  He is having his care  changed to [facility] and we were asked to follow his carotid disease.  Of note, he has had no prior history of stroke, TIAs, expressive or  receptive aphasia or amaurosis fugax.  He does have a known right  internal carotid artery occlusion based on a CT scan which I reviewed  which was performed in February of 2010.  His most recent duplex scan at  the [facility] in [place] from January of 2011 showed approximately 70%  left carotid stenosis.  The family states that initially he was being  considered for surgery but ultimately decided that it was not indicated  at this time.   His past medical history is significant for hypertension,  hypercholesterolemia, congestive heart failure and a history of  pneumonia.  He denies any history of diabetes, history of previous  myocardial infarction.   PAST SURGICAL HISTORY:  Significant for bilateral inguinal hernia  repairs.  He has had esophageal dilatations on multiple occasions also.   FAMILY HISTORY:  He had one brother who had a heart attack in his 88s.  He is unaware of any other history of premature cardiovascular disease.   SOCIAL HISTORY:  He is married.  He has three children.  He is a retired  Doctor, hospital.  He quit tobacco in 1969.   REVIEW OF SYSTEMS:  GENERAL:  He has had some mild weight loss  gradually.  He has had no fever or chills.  CARDIOVASCULAR:  He has had no chest pain, chest pressure, palpitations  or arrhythmias.  He does admit to some dyspnea on exertion.  He has had  no orthopnea.   He has had no claudication, rest pain or nonhealing  ulcers.  He has had no history of DVT or phlebitis.  GI:  He does have a history of reflux and a history of trouble  swallowing.  MUSCULOSKELETAL:  He does have a history of arthritis in his hands and  back.  ENT:  He has had some gradual change in his eyesight recently.  Pulmonary, GU, neurologic, psychiatric, hematologic, integumentary  review of systems is unremarkable and is documented on the medical  history form in his chart.   PHYSICAL EXAMINATION:  General:  This is a pleasant 77 year old  gentleman who appears his stated age.  Vital signs:  Blood pressure is  158/65, heart rate is 60, saturation 98%.  HEENT:  Unremarkable.  Lungs:  Are clear bilaterally to auscultation without rales, rhonchi or  wheezing.  Cardiovascular:  He has a left carotid bruit.  He has a  regular rate and rhythm.  He has palpable femoral pulses and palpable  pedal pulses.  Abdomen:  Soft and nontender.  He has normal pitched  bowel sounds.  Musculoskeletal:  There are no major deformities or  cyanosis.  Neurological:  He has no focal weakness or paresthesias.   I  did order and independently interpret his carotid duplex scan on the  left side which shows by velocity criteria a 60% to 79% left carotid  stenosis probably in the mid range.  I have compared this to his  previous duplex scan at [facility] and there has been no significant change.   I have explained that we typically would not consider elective repair of  an asymptomatic carotid stenosis unless it progressed to greater than  80%.  I have ordered a followup carotid duplex scan in 6 months and I  will see him back at that time.  We have reviewed the potential symptoms  of cerebrovascular disease.  He also knows to continue taking his  aspirin and Plavix.  He knows to call sooner if he has problems.     Di Kindle. Edilia Bo, M.D.  Electronically Signed   CSD/MEDQ  D:  09/03/2009  T:   09/04/2009  Job:  [id number]   cc:   Luis Abed, MD, [facility]

## 2010-06-29 NOTE — H&P (Signed)
HISTORY AND PHYSICAL EXAMINATION   March 03, 2010   Re:  NIV, DARLEY                DOB:  30-Apr-1924   REASON FOR ADMISSION:  Asymptomatic, greater than 80% left carotid  stenosis.   HISTORY:  This is a pleasant 77 year old gentleman whom I had originally  seen in consultation on September 03, 2009, with 60% to 79% left carotid  stenosis and a known right internal carotid artery occlusion.  He was  asymptomatic and we simply recommended a followup duplex scan in 6  months.  When he came in for his followup study on March 03, 2010, the  stenosis on the left had progressed to greater than 80% and left carotid  endarterectomy was recommended in order to lower his risk of future  stroke.  Of note, he was asymptomatic.  He denied any history of stroke,  TIAs, expressive or receptive aphasia, or amaurosis fugax.  His only  complaint had been some occasional tingling in his fingers in his left  hand.   PAST MEDICAL HISTORY:  Significant for hypertension,  hypercholesterolemia, and congestive heart failure.  He has had no  recent problems with congestive heart failure within the last year.  He  denies any history of diabetes or history of previous myocardial  infarction.   PAST SURGICAL HISTORY:  Significant for bilateral inguinal hernia  repairs.  He has also had esophageal dilatations in the past.   FAMILY HISTORY:  He had 1 brother who had a heart attack in his 4s.  There is no other history of premature cardiovascular disease.   SOCIAL HISTORY:  He is married.  He has 3 children.  He is a retired  Environmental manager.  He quit tobacco in 1969.   ALLERGIES:  Atenolol, diltiazem, iron, hydrochlorothiazide, labetalol,  niacin, terazosin, statins.   MEDICATIONS:  1. Omeprazole 20 mg p.o. daily.  2. Plavix 75 mg p.o. daily.  This was held on March 03, 2010, and      will be restarted after surgery.  3. Lasix 40 mg p.o. daily.  4. Lisinopril 5 mg p.o.  daily.  5. Pravastatin 40 mg p.o. daily.  6. Coreg 6.25 mg p.o. b.i.d.  7. Aspirin 81 mg p.o. daily.  8. Vitamin C 500 mg p.o. daily.  9. Vitamin B Complex with C daily.  10.Vitamin D 1 daily.  11.Avelox 400 mg p.o. daily.   REVIEW OF SYSTEMS:  GENERAL:  He had no recent weight loss, weight gain,  or problems with his appetite.  CARDIOVASCULAR:  He has had no chest pain, chest pressure, palpitations.  He does admit to some dyspnea on exertion but no orthopnea.  GI, NEUROLOGIC, PULMONARY, HEMATOLOGIC, GU, ENT, MUSCULOSKELETAL,  PSYCHIATRIC, INTEGUMENTARY review of systems is unremarkable.   PHYSICAL EXAMINATION:  This is a pleasant 77 year old gentleman who  appears his stated age.  Blood pressure is 168/62, heart rate is 52,  saturation 96%.  HEENT: Unremarkable.  Lungs:  Clear bilaterally to  auscultation.  Cardiovascular exam:  He has a left carotid bruit.  He  also has a right carotid bruit.  He has a regular rate and rhythm.  He  has palpable femoral pulses and palpable pedal pulses.  He has no  significant lower extremity swelling.  Abdomen:  Soft and nontender.  He  has normal pitched bowel sounds.  Musculoskeletal exam:  There are no  major deformities or cyanosis.  Neurologic exam:  He has no focal  weakness or paresthesias.  Skin: There are no ulcers or rashes.   I have independently interpreted his carotid duplex scan which shows  progression of the carotid stenosis on the left greater than 80%.  Peak  systolic velocities 458 cm/sec with an end-diastolic velocity of 118  cm/sec.  He has a known right internal carotid artery occlusion.   Given the progression of the left carotid stenosis to greater than 80%  with a known right internal carotid artery occlusion, I have recommended  right carotid endarterectomy in order to lower his risk of future  stroke.  We have discussed the indications for surgery and the potential  complications including but not limited to stroke  (peri-procedural risk  1% to 2%), nerve injury, MI, wound healing problems, or other  unpredictable medical problems.  All his questions were answered and he  is agreeable to proceed.  We have stopped his Plavix 1 week  preoperatively but he will continue his aspirin.  We will restart his  Plavix postoperatively.  I am somewhat a little bit worried about his  neck mobility.  However, I think he can turn his neck adequately to  allow [place] to proceed with carotid endarterectomy.     Di Kindle. Edilia Bo, M.D.  Electronically Signed   CSD/MEDQ  D:  03/03/2010  T:  03/04/2010  Job:  [id number]   cc:   Luis Abed, MD, [facility]

## 2010-06-29 NOTE — Procedures (Signed)
CAROTID DUPLEX EXAM   INDICATION:  Follow up carotid artery disease, known right ICA  occlusion.   HISTORY:  Diabetes:  No.  Cardiac:  No.  Hypertension:  Yes.  Smoking:  Previous.  Previous Surgery:  No.  CV History:  Asymptomatic.  Amaurosis Fugax No, Paresthesias No, Hemiparesis No.                                       RIGHT             LEFT  Brachial systolic pressure:  Brachial Doppler waveforms:  Vertebral direction of flow:                          Antegrade  DUPLEX VELOCITIES (cm/sec)  CCA peak systolic                                     M = 121, D =  291/49  ECA peak systolic                                     259  ICA peak systolic                                     360  ICA end diastolic                                     74  PLAQUE MORPHOLOGY:                                    Calcific  PLAQUE AMOUNT:                                        Moderate/severe  PLAQUE LOCATION:  ICA/ECA/bifurcation   IMPRESSION:  1. Left-side-only study, per Dr. Edilia Bo.  2. Left internal carotid artery shows evidence of 60% to 79% stenosis.  3. Left distal common carotid artery stenosis extending into the      internal carotid artery and external carotid artery.  4. Left external carotid artery stenosis.  5. Known right internal carotid artery occlusion.   ___________________________________________  Di Kindle. Edilia Bo, M.D.   AS/MEDQ  D:  09/03/2009  T:  09/03/2009  Job:  [id number]

## 2010-06-29 NOTE — Discharge Summary (Signed)
NAME:  MAKYLE, ESLICK NO.:  [id number]   MEDICAL RECORD NO.:  [id number]          PATIENT TYPE:  INP   LOCATION:  5001                         FACILITY:  [facility]   PHYSICIAN:  Angeline Slim, M.D.  DATE OF BIRTH:  Jan 23, 1925   DATE OF ADMISSION:  02/02/2007  DATE OF DISCHARGE:  02/02/2007                               DISCHARGE SUMMARY   Mr. Buckbee fracture his right, and this was discovered at the [facility] today on February 02, 2007.   The physician responsible spoke with Dr. Mina Marble who is on call for  orthopedics, who based on their conversation, recommended surgery later  that day.  The patient was medically stable at the time.  However, Dr.  Mina Marble refused to admit the patient, so [facility]aching [facility] was called to admit.  We arranged for direct admission,  to bypass the emergency department for this case.  Shortly after he  arrived at the floor, Dr. Mina Marble read the films and decided the  fracture was nonoperative.  He recommended a sugar-tong splint at that  time that was placed, and also recommend that he have close follow-up  with himself.  At that point, family practice teaching service still had  not seen the patient, but the R.N. in charge of the patient then  informed us of best of Dr. Ronie Spies decision, and we released the  patient, since there was no indication for admission, since surgery was  not performed and he had no acute medical problems or reasons for  admission.  We had not charged him for physician services, and we  recommended the hospital consider not charging, since it certainly was  not the patient's fault that he was sent over for this non-admission.      Angeline Slim, M.D.  Electronically Signed     AL/MEDQ  D:  02/02/2007  T:  02/04/2007  Job:  [id number]

## 2010-07-16 ENCOUNTER — Other Ambulatory Visit: Payer: Self-pay | Admitting: Cardiology

## 2010-07-19 ENCOUNTER — Ambulatory Visit (INDEPENDENT_AMBULATORY_CARE_PROVIDER_SITE_OTHER): Payer: Medicare Other | Admitting: Cardiology

## 2010-07-19 ENCOUNTER — Encounter: Payer: Self-pay | Admitting: Cardiology

## 2010-07-19 DIAGNOSIS — I10 Essential (primary) hypertension: Secondary | ICD-10-CM

## 2010-07-19 DIAGNOSIS — I509 Heart failure, unspecified: Secondary | ICD-10-CM

## 2010-07-19 MED ORDER — AMLODIPINE BESYLATE 5 MG PO TABS: 5.0000 mg | ORAL_TABLET | Freq: Every day | ORAL | Status: DC

## 2010-07-19 NOTE — Progress Notes (Signed)
HPI Patient is seen today for followup hypertension and vascular disease.  I saw him last in Jun 23, 2010.  His blood pressure was elevated his lisinopril dose was increased and he was to check his blood pressure and give [place] feedback.  He returns today.  He says that his pressure at home has been in the range of 150-160 systolic.  However today it is in the range of 190 systolic in the office.  Is not having any chest pain.  There is no shortness of breath.  There is no sign of CHF. Allergies  Allergen Reactions  . Atenolol   . Hydrochlorothiazide   . Iron   . Lovastatin   . Niacin   . Simvastatin     Current Outpatient Prescriptions  Medication Sig Dispense Refill  . Ascorbic Acid (VITAMIN C) 500 MG tablet Take 500 mg by mouth daily.        Marland Kitchen aspirin 81 MG tablet Take 81 mg by mouth daily.        Marland Kitchen b complex vitamins capsule Take 1 capsule by mouth daily.        . carvedilol (COREG) 6.25 MG tablet TAKE 1 TABLET BY MOUTH TWICE A DAY  60 tablet  3  . furosemide (LASIX) 40 MG tablet TAKE 1 TABLET BY MOUTH EVERY DAY  90 tablet  3  . lisinopril (PRINIVIL,ZESTRIL) 10 MG tablet Take 1 tablet (10 mg total) by mouth 2 (two) times daily.  180 tablet  3  . omeprazole (PRILOSEC) 20 MG capsule Take 20 mg by mouth 2 (two) times daily.        Marland Kitchen PLAVIX 75 MG tablet TAKE 1 TABLET BY MOUTH ONCE A DAY  90 tablet  1  . pravastatin (PRAVACHOL) 40 MG tablet TAKE 1 TABLET BY MOUTH EVERY DAY  90 tablet  3  . VITAMIN D, CHOLECALCIFEROL, PO Take 1 capsule by mouth daily.         History   Social History  . Marital Status: Married    Spouse Name: N/A    Number of Children: N/A  . Years of Education: N/A   Occupational History  . 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 Smoking status: Former Games developer  . Smokeless tobacco: Not on file  . Alcohol  Use: No  . Drug Use: No  . Sexually Active: Not on file   Other Topics Concern  . Not on file   Social History Narrative  . No narrative on file    Family History  Problem Relation Age of Onset  . Cancer Father     Prostate  . Heart disease Brother     x2    Past Medical History  Diagnosis Date  . Hypertension   . Hyperlipidemia   . Anemia of chronic disease 04/2009    hgb 8...transfusion 04/2009  . CHF (congestive heart failure) 04/07/09    CHF while in hospital February, 2011, EF 45%  . Esophageal stricture     Dilatation and VA, / dilatation later by Dr. Arlyce Dice  . CKD (chronic kidney disease), stage II   . Carotid artery disease     Total right carotid, 60-79% LICA Dr. Edilia Bo, July, 2011 / left carotid endarterectomy January, 2012     . Community acquired pneumonia   . GERD (gastroesophageal reflux disease)   .  Gout   . BPH (benign prostatic hyperplasia)   . Impaired fasting glucose   . Ejection fraction     45% by history  . Chest pain     Nuclear, April, 2011, no scar or ischemia    Past Surgical History  Procedure Date  . Cataract extraction   . Hernia repair   . Tonsillectomy     ROS  Patient denies fever, chills, headache, sweats, rash, change in vision, change in hearing, chest pain, cough, nausea vomiting, urinary symptoms.  All other systems are reviewed and are negative.  PHYSICAL EXAM Patient is oriented to person time and place.  Affect is normal.  He is here with his wife.  Head is atraumatic.  He has bilateral carotid bruits.  Lungs are clear.  Respiratory effort is nonlabored.  Cardiac exam reveals S1 and S2.  There is a soft systolic murmur.  The abdomen is soft.  There's no peripheral edema. Filed Vitals:   07/19/10 0922  BP: 192/70  Pulse: 56  Height: 5\' 7"  (1.702 m)  Weight: 135 lb (61.236 kg)    EKG Is done today and reviews mild sinus bradycardia.  There is increased voltage.  EKG is reviewed by me.  ASSESSMENT & PLAN

## 2010-07-19 NOTE — Assessment & Plan Note (Signed)
I am concerned about his hypertension.  Amlodipine 5 mg will be started today.  He will be seeing his primary physician within the next one to 2 weeks.  At that time if his pressure is not controlled, I would suggest increasing the amlodipine to 10 mg daily.  I will leave the titration of his hypertensive meds to his primary physician.

## 2010-07-19 NOTE — Patient Instructions (Signed)
Start Amlodipine 5 mg daily Your physician wants you to follow-up in: 6 months.  You will receive a reminder letter in the mail two months in advance. If you don't receive a letter, please call our office to schedule the follow-up appointment.

## 2010-07-19 NOTE — Assessment & Plan Note (Signed)
At this time the patient has no signs of congestive heart failure.  No change in therapy.  Aggressive treatment of his blood pressure is the treatment of choice.

## 2010-07-20 ENCOUNTER — Encounter: Payer: Self-pay | Admitting: Internal Medicine

## 2010-07-21 ENCOUNTER — Encounter: Payer: Self-pay | Admitting: Internal Medicine

## 2010-07-21 ENCOUNTER — Ambulatory Visit (INDEPENDENT_AMBULATORY_CARE_PROVIDER_SITE_OTHER): Payer: Medicare Other | Admitting: Internal Medicine

## 2010-07-21 VITALS — BP 158/60 | HR 57 | Temp 97.8°F | Ht 67.0 in | Wt 136.0 lb

## 2010-07-21 DIAGNOSIS — E785 Hyperlipidemia, unspecified: Secondary | ICD-10-CM

## 2010-07-21 DIAGNOSIS — I10 Essential (primary) hypertension: Secondary | ICD-10-CM

## 2010-07-21 DIAGNOSIS — I509 Heart failure, unspecified: Secondary | ICD-10-CM

## 2010-07-21 DIAGNOSIS — N400 Enlarged prostate without lower urinary tract symptoms: Secondary | ICD-10-CM

## 2010-07-21 DIAGNOSIS — K222 Esophageal obstruction: Secondary | ICD-10-CM

## 2010-07-21 LAB — HEPATIC FUNCTION PANEL
ALT: 9 U/L (ref 0–53)
AST: 16 U/L (ref 0–37)
Alkaline Phosphatase: 184 U/L — ABNORMAL HIGH (ref 39–117)
Bilirubin, Direct: 0.1 mg/dL (ref 0.0–0.3)
Total Protein: 7.2 g/dL (ref 6.0–8.3)

## 2010-07-21 LAB — CBC WITH DIFFERENTIAL/PLATELET
Basophils Absolute: 0 10*3/uL (ref 0.0–0.1)
Eosinophils Absolute: 0.1 10*3/uL (ref 0.0–0.7)
Hemoglobin: 10.1 g/dL — ABNORMAL LOW (ref 13.0–17.0)
Lymphocytes Relative: 35.6 % (ref 12.0–46.0)
MCHC: 34.1 g/dL (ref 30.0–36.0)
Neutro Abs: 2.3 10*3/uL (ref 1.4–7.7)
Neutrophils Relative %: 50.2 % (ref 43.0–77.0)
Platelets: 166 10*3/uL (ref 150.0–400.0)
RDW: 14.4 % (ref 11.5–14.6)

## 2010-07-21 LAB — BASIC METABOLIC PANEL
CO2: 30 mEq/L (ref 19–32)
Chloride: 100 mEq/L (ref 96–112)
Potassium: 5.2 mEq/L — ABNORMAL HIGH (ref 3.5–5.1)
Sodium: 137 mEq/L (ref 135–145)

## 2010-07-21 LAB — TSH: TSH: 1.28 u[IU]/mL (ref 0.35–5.50)

## 2010-07-21 NOTE — Progress Notes (Signed)
Subjective:    Patient ID: Anthony Werner, male    DOB: 06-25-1924, 77 y.o.   MRN: [id number]  HPI Doing okay Just started amlodipine 2 days ago No dizziness or edema Seems to be tolerating it fine  No chest pain Still gets DOE when pushes it on the treadmill---this is stable On orthopnea or PND  No sig heartburn No recent dysphagia  Still on statin No sig myalgias---has had some right elbow pain  Current Outpatient Prescriptions on File Prior to Visit  Medication Sig Dispense Refill  . amLODipine (NORVASC) 5 MG tablet Take 1 tablet (5 mg total) by mouth daily.  30 tablet  6  . Ascorbic Acid (VITAMIN C) 500 MG tablet Take 500 mg by mouth daily.        Marland Kitchen aspirin 81 MG tablet Take 81 mg by mouth daily.        Marland Kitchen b complex vitamins capsule Take 1 capsule by mouth daily.        . carvedilol (COREG) 6.25 MG tablet TAKE 1 TABLET BY MOUTH TWICE A DAY  60 tablet  3  . furosemide (LASIX) 40 MG tablet TAKE 1 TABLET BY MOUTH EVERY DAY  90 tablet  3  . lisinopril (PRINIVIL,ZESTRIL) 10 MG tablet Take 1 tablet (10 mg total) by mouth 2 (two) times daily.  180 tablet  3  . omeprazole (PRILOSEC) 20 MG capsule Take 20 mg by mouth 2 (two) times daily.        Marland Kitchen PLAVIX 75 MG tablet TAKE 1 TABLET BY MOUTH ONCE A DAY  90 tablet  1  . pravastatin (PRAVACHOL) 40 MG tablet TAKE 1 TABLET BY MOUTH EVERY DAY  90 tablet  3  . VITAMIN D, CHOLECALCIFEROL, PO Take 1 capsule by mouth daily.        Past Medical History  Diagnosis Date  . Hypertension   . Hyperlipidemia   . Anemia of chronic disease 04/2009    hgb 8...transfusion 04/2009  . CHF (congestive heart failure) 04/07/09    CHF while in hospital February, 2011, EF 45%  . Esophageal stricture     Dilatation and VA, / dilatation later by Dr. Arlyce Dice  . CKD (chronic kidney disease), stage II   . Carotid artery disease     Total right carotid, 60-79% LICA Dr. Edilia Bo, July, 2011 / left carotid endarterectomy January, 2012     . Community acquired pneumonia    . GERD (gastroesophageal reflux disease)   . Gout   . BPH (benign prostatic hyperplasia)   . Impaired fasting glucose   . Ejection fraction     45% by history  . Chest pain     Nuclear, April, 2011, no scar or ischemia    Past Surgical History  Procedure Date  . Cataract extraction   . Hernia repair   . Tonsillectomy     Family History  Problem Relation Age of Onset  . Cancer Father     Prostate  . Heart disease Brother     x2    History   Social History  . Marital Status: Married    Spouse Name: N/A    Number of Children: 3  . Years of Education: N/A   Occupational History  . retired, Doctor, hospital   .     Social History Main Topics  . Smoking status: Former Games developer  . Smokeless tobacco: Not on file  . Alcohol Use: No  . Drug Use: No  . Sexually Active:  Not on file   Other Topics Concern  . 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 No recent change    Objective:   Physical Exam  Constitutional: He appears well-developed and well-nourished. No distress.  Neck: Normal range of motion. Neck supple. No thyromegaly present.  Cardiovascular: Normal rate, regular rhythm and normal heart sounds.  Exam reveals no gallop.   No murmur heard. Pulmonary/Chest: Effort normal and breath sounds normal. No respiratory distress. He has no wheezes.  Abdominal: Soft. There is no tenderness.  Musculoskeletal: Normal range of motion. He exhibits no edema and no tenderness.  Lymphadenopathy:    He has no cervical adenopathy.  Psychiatric: He has a normal mood and affect. His behavior is normal. Judgment and thought content normal.           Assessment & Plan:

## 2010-07-21 NOTE — Assessment & Plan Note (Signed)
Compensated. No changes needed. 

## 2010-07-21 NOTE — Assessment & Plan Note (Signed)
BP Readings from Last 3 Encounters:  07/21/10 158/60  07/19/10 192/70  06/23/10 180/63   Better today Continue amlodipine Will check again in 2 months

## 2010-07-21 NOTE — Assessment & Plan Note (Signed)
Voiding okay 

## 2010-07-21 NOTE — Assessment & Plan Note (Signed)
Will check labs on meds

## 2010-07-21 NOTE — Assessment & Plan Note (Signed)
Swallowing fine  No dysphagia Needs ongoing Rx

## 2010-07-23 ENCOUNTER — Encounter: Payer: Self-pay | Admitting: *Deleted

## 2010-08-06 ENCOUNTER — Other Ambulatory Visit: Payer: Medicare Other

## 2010-08-09 ENCOUNTER — Other Ambulatory Visit (INDEPENDENT_AMBULATORY_CARE_PROVIDER_SITE_OTHER): Payer: Medicare Other | Admitting: Internal Medicine

## 2010-08-09 DIAGNOSIS — R799 Abnormal finding of blood chemistry, unspecified: Secondary | ICD-10-CM

## 2010-08-09 DIAGNOSIS — R7989 Other specified abnormal findings of blood chemistry: Secondary | ICD-10-CM

## 2010-08-09 LAB — BASIC METABOLIC PANEL
Calcium: 9 mg/dL (ref 8.4–10.5)
Chloride: 104 mEq/L (ref 96–112)
Creatinine, Ser: 1.6 mg/dL — ABNORMAL HIGH (ref 0.4–1.5)

## 2010-08-11 ENCOUNTER — Encounter: Payer: Self-pay | Admitting: *Deleted

## 2010-08-19 ENCOUNTER — Encounter: Payer: Self-pay | Admitting: Vascular Surgery

## 2010-09-22 ENCOUNTER — Ambulatory Visit: Payer: Medicare Other | Admitting: Vascular Surgery

## 2010-09-22 ENCOUNTER — Other Ambulatory Visit: Payer: Medicare Other

## 2010-09-24 ENCOUNTER — Ambulatory Visit (INDEPENDENT_AMBULATORY_CARE_PROVIDER_SITE_OTHER): Payer: Medicare Other | Admitting: Internal Medicine

## 2010-09-24 ENCOUNTER — Encounter: Payer: Self-pay | Admitting: Internal Medicine

## 2010-09-24 DIAGNOSIS — N400 Enlarged prostate without lower urinary tract symptoms: Secondary | ICD-10-CM

## 2010-09-24 DIAGNOSIS — I10 Essential (primary) hypertension: Secondary | ICD-10-CM

## 2010-09-24 DIAGNOSIS — K222 Esophageal obstruction: Secondary | ICD-10-CM

## 2010-09-24 MED ORDER — FUROSEMIDE 40 MG PO TABS: 40.0000 mg | ORAL_TABLET | Freq: Every day | ORAL | Status: DC

## 2010-09-24 MED ORDER — PRAVASTATIN SODIUM 40 MG PO TABS: 40.0000 mg | ORAL_TABLET | Freq: Every day | ORAL | Status: DC

## 2010-09-24 MED ORDER — OMEPRAZOLE 20 MG PO CPDR: 20.0000 mg | DELAYED_RELEASE_CAPSULE | Freq: Two times a day (BID) | ORAL | Status: DC

## 2010-09-24 NOTE — Progress Notes (Signed)
Subjective:    Patient ID: Anthony Werner, male    DOB: 10-30-1924, 77 y.o.   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 amLODipine (NORVASC) 5 MG tablet Take 1 tablet (5 mg total) by mouth daily.  30 tablet  6  . Ascorbic Acid (VITAMIN C) 500 MG tablet Take 500 mg by mouth daily.        Marland Kitchen aspirin 81 MG tablet Take 81 mg by mouth daily.        Marland Kitchen b complex vitamins capsule Take 1 capsule by mouth daily.        . carvedilol (COREG) 6.25 MG tablet TAKE 1 TABLET BY MOUTH TWICE A DAY  60 tablet  3  . furosemide (LASIX) 40 MG tablet TAKE 1 TABLET BY MOUTH EVERY DAY  90 tablet  3  . lisinopril (PRINIVIL,ZESTRIL) 10 MG tablet Take 1 tablet (10 mg total) by mouth 2 (two) times daily.  180 tablet  3  . omeprazole (PRILOSEC) 20 MG capsule Take 20 mg by mouth 2 (two) times daily.        Marland Kitchen PLAVIX 75 MG tablet TAKE 1 TABLET BY MOUTH ONCE A DAY  90 tablet  1  . pravastatin (PRAVACHOL) 40 MG tablet TAKE 1 TABLET BY MOUTH EVERY DAY  90 tablet  3  . VITAMIN D, CHOLECALCIFEROL, PO Take 1 capsule by mouth daily.         Allergies  Allergen Reactions  . Atenolol   . Diltiazem   . Hydrochlorothiazide   . Iron   . Labetalol   . Lovastatin   . Niacin   . Simvastatin   . Terazosin     Past Medical History  Diagnosis Date  . Hypertension   . Hyperlipidemia   . Anemia of chronic disease 04/2009    hgb 8...transfusion 04/2009  . CHF (congestive heart failure) 04/07/09    CHF while in hospital  February, 2011, EF 45%  . Esophageal stricture     Dilatation and VA, / dilatation later by Dr. Arlyce Dice  . CKD (chronic kidney disease), stage II   . Carotid artery disease     Total right carotid, 60-79% LICA Dr. Edilia Bo, July, 2011 / left carotid endarterectomy January, 2012     . Community acquired pneumonia   . GERD (gastroesophageal reflux disease)   . Gout   . BPH (benign prostatic hyperplasia)   . Impaired fasting glucose   . Ejection fraction     45% by history  . Chest pain     Nuclear, April, 2011, no scar or ischemia    Past Surgical History  Procedure Date  . Cataract extraction   . Hernia repair   . Tonsillectomy   . Carotid endarterectomy LEFT    Family History  Problem Relation Age of Onset  . Cancer Father     Prostate  . Heart disease Brother     x2    History   Social History  .  Marital Status: Married    Spouse Name: N/A    Number of Children: 3  . Years of Education: N/A   Occupational History  . retired, Doctor, hospital   .     Social History Main Topics  . Smoking status: Former Games developer  . Smokeless tobacco: Not on file  . Alcohol Use: No  . Drug Use: No  . Sexually Active: Not on file   Other Topics Concern  . 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 No distress.  Neck: Normal range of motion. Neck supple. No thyromegaly present.  Cardiovascular: Normal rate, regular rhythm and normal heart sounds.  Exam reveals no gallop.   No murmur heard. Pulmonary/Chest: Effort normal and breath sounds normal. No respiratory distress. He has no wheezes. He has no rales.    Musculoskeletal: Normal range of motion. He exhibits no edema and no tenderness.  Lymphadenopathy:    He has no cervical adenopathy.  Psychiatric: He has a normal mood and affect. His behavior is normal. Judgment and thought content normal.          Assessment & Plan:

## 2010-09-24 NOTE — Assessment & Plan Note (Signed)
Swallowing has been fine Needs to continue PPI bid

## 2010-09-24 NOTE — Assessment & Plan Note (Signed)
BP Readings from Last 3 Encounters:  09/24/10 159/49  07/21/10 158/60  07/19/10 192/70   Reasonable control Further lowering contraindicated given age and low diastolic No changes

## 2010-09-24 NOTE — Assessment & Plan Note (Signed)
Voiding okay No meds

## 2010-09-27 ENCOUNTER — Encounter: Payer: Self-pay | Admitting: Vascular Surgery

## 2010-10-12 ENCOUNTER — Encounter: Payer: Self-pay | Admitting: Vascular Surgery

## 2010-10-13 ENCOUNTER — Encounter: Payer: Self-pay | Admitting: Vascular Surgery

## 2010-10-13 ENCOUNTER — Ambulatory Visit (INDEPENDENT_AMBULATORY_CARE_PROVIDER_SITE_OTHER): Payer: Medicare Other | Admitting: Vascular Surgery

## 2010-10-13 VITALS — BP 197/79 | HR 56 | Ht 67.0 in | Wt 132.0 lb

## 2010-10-13 DIAGNOSIS — I6529 Occlusion and stenosis of unspecified carotid artery: Secondary | ICD-10-CM

## 2010-10-13 DIAGNOSIS — Z48812 Encounter for surgical aftercare following surgery on the circulatory system: Secondary | ICD-10-CM

## 2010-10-13 NOTE — Progress Notes (Signed)
Subjective:     Patient ID: Anthony Werner, male   DOB: 01/15/1925, 77 y.o.   MRN: [id number]  HPI  Is an 77 year old gentleman who underwent a left carotid endarterectomy on March 09, 2010. He comes in for a 6 month followup visit. As I saw him last he said no history of stroke, TIAs, expressive or receptive aphasia, or amaurosis fugax.  The patient has a history of hypertension and hypercholesterolemia both of which are stable on his current medications. In addition he has a history of congestive heart failure but has had no recent problems with his congestive heart failure.   History  Substance Use Topics  . Smoking status: Former Smoker    Types: Cigarettes    Quit date: 02/15/1967  . Smokeless tobacco: Not on file  . Alcohol Use: No     Review of Systems  Constitutional: Negative for fever and chills.  Respiratory: Negative for chest tightness and shortness of breath.   Cardiovascular: Negative for chest pain and palpitations.       Objective:   Physical Exam  Constitutional: He is oriented to person, place, and time.  HENT:  Head: Normocephalic and atraumatic.  Neck: Neck supple. No JVD present. No thyromegaly present.  Cardiovascular: Normal rate, regular rhythm and normal heart sounds.  Exam reveals no friction rub.   No murmur heard. Pulmonary/Chest: Breath sounds normal. He has no wheezes. He has no rales.  Abdominal: Soft. Bowel sounds are normal. There is no tenderness.       No palpable aneurysm detected.  Musculoskeletal: Normal range of motion. He exhibits no edema and no tenderness.  Lymphadenopathy:    He has no cervical adenopathy.  Neurological: He is alert and oriented to person, place, and time. He has normal strength. No sensory deficit.  Skin: No lesion and no rash noted.   Filed Vitals:   10/13/10 1508  BP: 197/79  Pulse: 56   The patient has no evidence of recurrent carotid stenosis on the left. Given the known right internal carotid artery  occlusion I think we need to follow his carotid duplex at yearly intervals. Body mass index is 20.67 kg/(m^2).   I have independently interpreted his carotid duplex scan. This shows no evidence of significant recurrent carotid stenosis on the left side. He has a known right internal carotid artery occlusion. Both vertebral arteries are patent with normal he directed flow. His arm pressures are essentially equal.     Assessment:     The patient has no evidence of recurrent carotid stenosis on the left. I recommended a followup carotid duplex scan in one year now see him back at that time. Given the right internal carotid artery occlusion and could be a good idea to follow his carotid duplex scan at yearly intervals.    Plan:     I'll see him back in one year. He knows to call sooner if he has problems. In the meantime he is to continue taking his aspirin.

## 2010-10-22 NOTE — Procedures (Unsigned)
CAROTID DUPLEX EXAM  INDICATION:  Follow up carotid stenosis.  HISTORY: Diabetes:  No. Cardiac:  CHF. Hypertension:  Yes. Smoking:  Previous. Previous Surgery:  Left carotid endarterectomy on 03/09/2010. CV History:  Asymptomatic. Amaurosis Fugax No, Paresthesias No, Hemiparesis No.                                      RIGHT             LEFT Brachial systolic pressure:         164               170 Brachial Doppler waveforms:         WNL               WNL Vertebral direction of flow:        Antegrade         Antegrade DUPLEX VELOCITIES (cm/sec) CCA peak systolic                   69                129 ECA peak systolic                   411               322 ICA peak systolic                   Occluded          171 ICA end diastolic                   Occluded          45 PLAQUE MORPHOLOGY:                  Calcified         Calcified PLAQUE AMOUNT:                      Occlusive         Mild PLAQUE LOCATION:                    CCA, ICA, ECA     CCA  IMPRESSION: 1. Right internal carotid artery known occlusion. 2. Bilateral external carotid artery stenosis present.  However,     velocities may be somewhat elevated due to compensatory flow. 3. Left internal carotid artery appears patent with history of     endarterectomy.  Elevated velocities present, which may be normal     and consistent with recently endarterectomized vessel.  No     hyperplasia or significant plaque formation is visualized. 4. Patent antegrade vertebral arteries bilaterally.  ___________________________________________ Di Kindle. Edilia Bo, M.D.  SH/MEDQ  D:  10/13/2010  T:  10/13/2010  Job:  [id number]

## 2010-11-11 ENCOUNTER — Encounter: Payer: Self-pay | Admitting: Cardiology

## 2010-11-11 ENCOUNTER — Ambulatory Visit (INDEPENDENT_AMBULATORY_CARE_PROVIDER_SITE_OTHER): Payer: Medicare Other | Admitting: Cardiology

## 2010-11-11 DIAGNOSIS — I10 Essential (primary) hypertension: Secondary | ICD-10-CM

## 2010-11-11 DIAGNOSIS — R079 Chest pain, unspecified: Secondary | ICD-10-CM

## 2010-11-11 DIAGNOSIS — N182 Chronic kidney disease, stage 2 (mild): Secondary | ICD-10-CM

## 2010-11-11 MED ORDER — CARVEDILOL 12.5 MG PO TABS: 12.5000 mg | ORAL_TABLET | Freq: Two times a day (BID) | ORAL | Status: DC

## 2010-11-11 MED ORDER — ISOSORBIDE MONONITRATE 30 MG PO TB24: 30.0000 mg | ORAL_TABLET | ORAL | Status: DC

## 2010-11-11 NOTE — Assessment & Plan Note (Signed)
The patient has significant carotid artery disease.  It is being followed carefully by vascular surgery

## 2010-11-11 NOTE — Assessment & Plan Note (Signed)
Blood pressure is elevated today.  His beta blocker dose will be increased and also I will add Imdur

## 2010-11-11 NOTE — Assessment & Plan Note (Signed)
I am concerned about the patient's current exertional chest burning.  It certainly could represent angina.  EKG is not changed.  I have carefully considered whether we should proceed directly to catheterization.  However, the patient has had a significant anemia in the past and we need to be sure that his hemoglobin is not low.  In addition he has renal dysfunction.  I suspect he would be stable with low dye volume cath, but I want to be sure that his renal function is safe before we proceed.  He did have chest pain in April of 2011 and his nuclear scan showed no abnormality and he was stable after that time.  I therefore decided to obtain labs including a CBC and chemistry.  We will proceed with a stress Myoview scan.  I will have him try to walk on the treadmill because his symptom has been with exertion.  If he cannot exercise adequately we will change to [place].  I will plan to see him back for early followup.  I also plan to increase his carvedilol dose and to have him carry nitroglycerin.  If he were to develop any prolonged symptoms he directlytothehospital

## 2010-11-11 NOTE — Patient Instructions (Signed)
Your physician recommends that you schedule a follow-up appointment in: AFTER  MYOVIEW  WITH DR  Myrtis Ser Your physician has recommended you make the following change in your medication: INCREASE CARVEDILOL TO 12.5 MG 1 TAB TWICE DAILY  AND ADD IMDUR 30 MG 1 EVERY DAY Your physician recommends that you return for lab work in: TODAY BMET CBC  DX CHEST PAIN  Your physician has requested that you have en exercise stress myoview. For further information please visit [email or web address] Please follow instruction sheet, as given.  SOON DX  CHEST PAIN

## 2010-11-11 NOTE — Progress Notes (Signed)
HPI The patient is seen today for the evaluation of chest burning.  He has known significant vascular disease.  His carotid disease was stable when he was seen by vascular surgery as an outpatient on October 13, 2010.  The patient had pneumonia in February, 2011.  He had slight troponin elevation at that time and his ejection fraction was 45%.  Decision was made to follow him with a nuclear study.  This was done in April, 2011.  He had no definite scar or ischemia.  He's been active since then.  The patient now notes that he has a burning sensation in his chest.  This may come on after eating.  This may occur when working outside in his yard after eating.  It certainly could represent angina.  He has not had any rest pain.  He has not had nausea vomiting or diaphoresis.  The patient has had anemia in the past.  His hemoglobin has been as low as 8.  He was transfused in March, 2011.  His hemoglobin in June, 2012 was 10.1.  He also has some renal dysfunction.  He is 77 years of age and his creatinine is 1.6.  His GFR appears to be in the range of 50 when checked last. Allergies  Allergen Reactions  . Atenolol   . Diltiazem   . Hydrochlorothiazide   . Iron   . Labetalol   . Lovastatin   . Niacin   . Simvastatin   . Terazosin     Current Outpatient Prescriptions  Medication Sig Dispense Refill  . amLODipine (NORVASC) 5 MG tablet Take 1 tablet (5 mg total) by mouth daily.  30 tablet  6  . Ascorbic Acid (VITAMIN C) 500 MG tablet Take 500 mg by mouth daily.        Marland Kitchen aspirin 81 MG tablet Take 81 mg by mouth daily.        Marland Kitchen b complex vitamins capsule Take 1 capsule by mouth daily.        . carvedilol (COREG) 6.25 MG tablet TAKE 1 TABLET BY MOUTH TWICE A DAY  60 tablet  3  . furosemide (LASIX) 40 MG tablet Take 1 tablet (40 mg total) by mouth daily.  90 tablet  3  . lisinopril (PRINIVIL,ZESTRIL) 10 MG tablet Take 1 tablet (10 mg total) by mouth 2 (two) times daily.  180 tablet  3  . omeprazole  (PRILOSEC) 20 MG capsule Take 1 capsule (20 mg total) by mouth 2 (two) times daily.  180 capsule  3  . PLAVIX 75 MG tablet TAKE 1 TABLET BY MOUTH ONCE A DAY  90 tablet  1  . pravastatin (PRAVACHOL) 40 MG tablet Take 1 tablet (40 mg total) by mouth daily.  90 tablet  3  . VITAMIN D, CHOLECALCIFEROL, PO Take 1 capsule by mouth daily.         History   Social History  . Marital Status: Married    Spouse Name: N/A    Number of Children: 3  . Years of Education: N/A   Occupational History  . retired, Doctor, hospital   .     Social History Main Topics  . Smoking status: Former Smoker    Types: Cigarettes    Quit date: 02/15/1967  . Smokeless tobacco: Not on file  . Alcohol Use: No  . Drug Use: No  . Sexually Active: Not on file   Other Topics Concern  . 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 Cancer Father     Prostate  . Heart disease Brother     x2    Past Medical History  Diagnosis Date  . Hypertension   . Hyperlipidemia   . Anemia of chronic disease 04/2009    hgb 8...transfusion 04/2009  . CHF (congestive heart failure) 04/07/09    CHF while in hospital February, 2011, EF 45%  . Esophageal stricture     Dilatation and VA, / dilatation later by Dr. Arlyce Dice  . CKD (chronic kidney disease), stage II   . Carotid artery disease     Total right carotid, 60-79% LICA Dr. Edilia Bo, July, 2011 / left carotid endarterectomy January, 2012     . Community acquired pneumonia   . GERD (gastroesophageal reflux disease)   . Gout   . BPH (benign prostatic hyperplasia)   . Impaired fasting glucose   . Ejection fraction     45% by history  . Chest pain     Nuclear, April, 2011, no scar or ischemia    Past Surgical History    Procedure Date  . Cataract extraction   . Hernia repair   . Tonsillectomy   . Esophageal dilation     x3  . Carotid endarterectomy LEFT    03/10/2010    ROS  Patient denies fever, chills, headache, sweats, rash, change in vision, change in hearing, cough, nausea vomiting, urinary symptoms.  All other systems are reviewed and are negative.  PHYSICAL EXAM Patient is here with his wife.  He is oriented to person time and place.  Affect is normal.  Head is atraumatic.  There is no xanthelasma.  Lungs are clear.  Respiratory effort is not labored.  There is no jugular venous distention.  Patient has loud bilateral carotid bruits.  Cardiac exam reveals an S1 and S2.  There no clicks or significant murmurs.  The abdomen is soft.  There is no peripheral edema.  There are no musculoskeletal deformities.  There is no skin rashes. Filed Vitals:   11/11/10 1443  BP: 189/68  Pulse: 64  Height: 5\' 7"  (1.702 m)  Weight: 138 lb (62.596 kg)    EKG is done today and reviewed by me.  He has increased voltage.  T Waves are mildly peaked. There is no significant change since the prior tracing.  ASSESSMENT & PLAN

## 2010-11-11 NOTE — Assessment & Plan Note (Signed)
The patient does have GERD.  He mentions that sometimes he feels is burning when he lies down after eating.  I've encouraged him to stop lying down after he eats.  He is on omeprazole.  This is to be continued.

## 2010-11-11 NOTE — Assessment & Plan Note (Signed)
The patient does have some renal dysfunction.  I want to be sure everything is stable before we consider cardiac catheterization.

## 2010-11-12 LAB — CBC WITH DIFFERENTIAL/PLATELET
Basophils Absolute: 0 10*3/uL (ref 0.0–0.1)
Eosinophils Relative: 2.2 % (ref 0.0–5.0)
HCT: 28.1 % — ABNORMAL LOW (ref 39.0–52.0)
Lymphs Abs: 1.9 10*3/uL (ref 0.7–4.0)
MCV: 93.7 fl (ref 78.0–100.0)
Monocytes Absolute: 0.5 10*3/uL (ref 0.1–1.0)
Monocytes Relative: 10.1 % (ref 3.0–12.0)
Neutrophils Relative %: 49.3 % (ref 43.0–77.0)
Platelets: 181 10*3/uL (ref 150.0–400.0)
RDW: 14.3 % (ref 11.5–14.6)
WBC: 5.1 10*3/uL (ref 4.5–10.5)

## 2010-11-12 LAB — BASIC METABOLIC PANEL
Calcium: 9.6 mg/dL (ref 8.4–10.5)
GFR: 59.54 mL/min — ABNORMAL LOW (ref >60.00)
Glucose, Bld: 91 mg/dL (ref 70–99)
Potassium: 5.6 mEq/L — ABNORMAL HIGH (ref 3.5–5.1)
Sodium: 142 mEq/L (ref 135–145)

## 2010-11-12 NOTE — Progress Notes (Signed)
Addended by: Early Chars on: 11/12/2010 12:24 PM   Modules accepted: Orders

## 2010-11-17 ENCOUNTER — Encounter: Payer: Self-pay | Admitting: *Deleted

## 2010-11-23 ENCOUNTER — Ambulatory Visit (HOSPITAL_COMMUNITY): Payer: Medicare Other | Attending: Cardiology | Admitting: Radiology

## 2010-11-23 DIAGNOSIS — R0602 Shortness of breath: Secondary | ICD-10-CM

## 2010-11-23 DIAGNOSIS — I10 Essential (primary) hypertension: Secondary | ICD-10-CM

## 2010-11-23 DIAGNOSIS — I491 Atrial premature depolarization: Secondary | ICD-10-CM

## 2010-11-23 DIAGNOSIS — R9431 Abnormal electrocardiogram [ECG] [EKG]: Secondary | ICD-10-CM

## 2010-11-23 DIAGNOSIS — R0789 Other chest pain: Secondary | ICD-10-CM

## 2010-11-23 DIAGNOSIS — N182 Chronic kidney disease, stage 2 (mild): Secondary | ICD-10-CM | POA: Insufficient documentation

## 2010-11-23 DIAGNOSIS — R079 Chest pain, unspecified: Secondary | ICD-10-CM | POA: Insufficient documentation

## 2010-11-23 DIAGNOSIS — I129 Hypertensive chronic kidney disease with stage 1 through stage 4 chronic kidney disease, or unspecified chronic kidney disease: Secondary | ICD-10-CM | POA: Insufficient documentation

## 2010-11-23 MED ORDER — TECHNETIUM TC 99M TETROFOSMIN IV KIT
33.0000 | PACK | Freq: Once | INTRAVENOUS | Status: AC | PRN
  Administered 2010-11-23: 33 via INTRAVENOUS

## 2010-11-23 MED ORDER — REGADENOSON 0.4 MG/5ML IV SOLN
0.4000 mg | Freq: Once | INTRAVENOUS | Status: AC
  Administered 2010-11-23: 0.4 mg via INTRAVENOUS

## 2010-11-23 MED ORDER — TECHNETIUM TC 99M TETROFOSMIN IV KIT
11.0000 | PACK | Freq: Once | INTRAVENOUS | Status: AC | PRN
  Administered 2010-11-23: 11 via INTRAVENOUS

## 2010-11-23 NOTE — Progress Notes (Signed)
[facility] 3 NUCLEAR MED [address] [place] [address] [phone]  Cardiology Nuclear Med Study  Anthony Werner is a 77 y.o. male [id number] 04/08/1924   Nuclear Med Background Indication for Stress Test:  Evaluation for Ischemia History:  COPD, 02/11 Echo: EF 45%, Emphysema, '75 Heart Catheterization and 04/25/11Myocardial Perfusion Study: EF 66% inf. Thinning (-) ischemia or scar Cardiac Risk Factors: Carotid Disease, Family History - CAD, History of Smoking, Hypertension, Lipids and PVD  Symptoms:  Chest Pressure and Chest Tightness and Palpitations   Nuclear Pre-Procedure Caffeine/Decaff Intake:  None NPO After: 6:30pm   Lungs:  clear IV 0.9% NS with Angio Cath:  20g  IV Site: R Forearm x 1, tolerated well IV Started by:  Irean Hong, RN  Chest Size (in):  38 Cup Size: n/a  Height: 5\' 7"  (1.702 m)  Weight:  137 lb (62.143 kg)  BMI:  Body mass index is 21.46 kg/(m^2). Tech Comments:  Held coreg x 24hrs  This patient was switched to a walking Lexiscan. He was unable to get his HR up and keep up with the treadmill. The patient started having chest tightness before he was switched to Lexiscan. He had 6/10 chest tightness when he walked. Pictures checked, Dr.Katz reviewed his positive EKGs and he is following up with him on November 25, 2010 at 2:45. S.Williams EMTP     Nuclear Med Study 1 or 2 day study: 1 day  Stress Test Type:  Treadmill/Lexiscan  Reading MD: Arvilla Meres, MD  Order Authorizing Provider:  Willa Rough, MD  Resting Radionuclide: Technetium 36m Tetrofosmin  Resting Radionuclide Dose: 11.0 mCi   Stress Radionuclide:  Technetium 26m Tetrofosmin  Stress Radionuclide Dose: 33.0 mCi           Stress Protocol Rest HR: 57 Stress HR: 96  Rest BP: 192/62 Stress BP: 168/51  Exercise Time (min): 5:42 METS: 6.0   Predicted Max HR: 136 bpm % Max HR: 70.59 bpm Rate Pressure Product: 21308   Dose of Adenosine (mg):  n/a Dose of  Lexiscan: 0.4 mg  Dose of Atropine (mg): n/a Dose of Dobutamine: n/a mcg/kg/min (at max HR)  Stress Test Technologist: Milana Na, EMT-P  Nuclear Technologist:  Domenic Polite, CNMT     Rest Procedure:  Myocardial perfusion imaging was performed at rest 45 minutes following the intravenous administration of Technetium 45m Tetrofosmin. Rest ECG: NSR  Stress Procedure:  The patient received IV Lexiscan 0.4 mg over 15-seconds with concurrent low level exercise and then Technetium 28m Tetrofosmin was injected at 30-seconds while the patient continued walking one more minute. This patient started to walk and had to be switched to a walking Lexiscan. He was unable to keep up with the treadmill and get his HR up where it needed to be. There were + changes with Lexiscan and a rare pac.  Quantitative spect images were obtained after a 45-minute delay. Stress ECG: Significant ST abnormalities consistent with ischemia.  QPS Raw Data Images:  Normal; no motion artifact; normal heart/lung ratio. Stress Images:  Normal homogeneous uptake in all areas of the myocardium. Rest Images:  Normal homogeneous uptake in all areas of the myocardium. Subtraction (SDS):  Normal Transient Ischemic Dilatation (Normal <1.22):  1.04 Lung/Heart Ratio (Normal <0.45):  0.32  Quantitative Gated Spect Images QGS EDV:  90 ml QGS ESV:  27 ml QGS cine images:  NL LV Function; NL Wall Motion QGS EF: 69%  Impression Exercise Capacity:  Lexiscan with low  level exercise. BP Response:  n/a Clinical Symptoms:  Chest tightness ECG Impression:  Significant ST abnormalities consistent with ischemia. Significant ST depression in multiple leads. Comparison with Prior Nuclear Study: No significant change from previous study  Overall Impression:  Probably normal stress test. Perfusion images are normal and there is no TID. However stress ECG markedly abnormal. This is likely false positive ECG but cannot exclude balanced  ischemia.    Anthony Werner

## 2010-11-24 LAB — POCT I-STAT CREATININE: Creatinine, Ser: 1.5

## 2010-11-24 LAB — I-STAT 8, (EC8 V) (CONVERTED LAB)
Acid-base deficit: 2
BUN: 33 — ABNORMAL HIGH
Bicarbonate: 23.6
HCT: 32 — ABNORMAL LOW
Operator id: 247071
TCO2: 25
pCO2, Ven: 42.9 — ABNORMAL LOW

## 2010-11-25 ENCOUNTER — Ambulatory Visit (INDEPENDENT_AMBULATORY_CARE_PROVIDER_SITE_OTHER): Payer: Medicare Other | Admitting: Cardiology

## 2010-11-25 ENCOUNTER — Encounter: Payer: Self-pay | Admitting: Cardiology

## 2010-11-25 DIAGNOSIS — R0989 Other specified symptoms and signs involving the circulatory and respiratory systems: Secondary | ICD-10-CM

## 2010-11-25 DIAGNOSIS — R9430 Abnormal result of cardiovascular function study, unspecified: Secondary | ICD-10-CM

## 2010-11-25 DIAGNOSIS — D638 Anemia in other chronic diseases classified elsewhere: Secondary | ICD-10-CM

## 2010-11-25 DIAGNOSIS — I10 Essential (primary) hypertension: Secondary | ICD-10-CM

## 2010-11-25 DIAGNOSIS — R079 Chest pain, unspecified: Secondary | ICD-10-CM

## 2010-11-25 DIAGNOSIS — E785 Hyperlipidemia, unspecified: Secondary | ICD-10-CM

## 2010-11-25 LAB — CBC
HCT: 30.1 — ABNORMAL LOW
Hemoglobin: 10.2 — ABNORMAL LOW
MCHC: 33.9
MCV: 93.6
Platelets: 166
RBC: 3.21 — ABNORMAL LOW
RDW: 13.7
WBC: 12.8 — ABNORMAL HIGH

## 2010-11-25 LAB — DIFFERENTIAL
Basophils Absolute: 0
Basophils Relative: 0
Eosinophils Absolute: 0
Eosinophils Relative: 0
Lymphocytes Relative: 10 — ABNORMAL LOW
Lymphs Abs: 1.2
Monocytes Absolute: 0.8 — ABNORMAL HIGH
Monocytes Relative: 6
Neutro Abs: 10.8 — ABNORMAL HIGH
Neutrophils Relative %: 84 — ABNORMAL HIGH

## 2010-11-25 LAB — POCT URINALYSIS DIP (DEVICE)
Hgb urine dipstick: NEGATIVE
Nitrite: NEGATIVE
Operator id: 116391
Protein, ur: NEGATIVE
Specific Gravity, Urine: 1.02
Urobilinogen, UA: 1

## 2010-11-25 NOTE — Assessment & Plan Note (Signed)
Lipids are being treated. No change in therapy. 

## 2010-11-25 NOTE — Assessment & Plan Note (Signed)
The patient's ejection fraction is better now than when he was hospitalized with pneumonia and some fluid overload.

## 2010-11-25 NOTE — Assessment & Plan Note (Signed)
As outlined in the history of present illness, the patient has not having chest discomfort currently with only a small modification in his medication.  His nuclear images showed no marked abnormalities.  We have to keep the possibility of balanced ischemia in mind but he did not have any dilatation of his ventricle.  I have reviewed the data at great length.  The patient tonight his wife have decided to watch him clinically.  If he does not have significant symptoms I will continue to follow him.  If he has the return of significant chest discomfort we will proceed with cardiac catheterization.  EKG changes aren't impressive.

## 2010-11-25 NOTE — Patient Instructions (Signed)
Your physician recommends that you schedule a follow-up appointment in:  4 to 5 weeks with Dr. Myrtis Ser. Patient needs an appointment with his PCP Dr. Alphonsus Sias for his anemia.

## 2010-11-25 NOTE — Progress Notes (Signed)
HPI  Patient is seen today for followup evaluation of chest discomfort.  I saw him last in the office on November 11, 2010.  He had some chest burning.  He had some anemia with a hemoglobin as low as 80 in March of 2011.  He was transfused at that time.  He also had renal dysfunction.  I tried to treat him conservatively and evaluate him by following labs and a stress nuclear scan.  His hemoglobin had been as high as 10.1.  The repeat recently was 9.4.  He will need further followup of his hemoglobin.  His chemistry functions reveal a creatinine of 1.5 and a GFR of 59.  This will certainly be adequate for catheterization if we decide to go this route.  The patient had a stress nuclear scan on November 23, 2010.  He received Lexiscan with low level exercise.  This protocol was used because he has started walking on the treadmill and was unable to get maximum heart rate.  However with the walking he did have chest pain and he did have significant flat and downsloping ST changes.  The EKG changes were abnormal and consistent with ischemia.  The nuclear images were reviewed by 2 readers independently.  Both felt that the nuclear images were normal.  There was no dilatation of the ventricle.  Therefore it seemed that the EKG changes were false positive.  However balanced ischemia cannot be ruled out completely.  It is of interest that since his last visit the patient has worked in his yard without difficulties.  This has included mowing his yard.  Allergies  Allergen Reactions  . Atenolol   . Diltiazem   . Hydrochlorothiazide   . Iron   . Labetalol   . Lovastatin   . Niacin   . Simvastatin   . Terazosin     Current Outpatient Prescriptions  Medication Sig Dispense Refill  . amLODipine (NORVASC) 5 MG tablet Take 1 tablet (5 mg total) by mouth daily.  30 tablet  6  . Ascorbic Acid (VITAMIN C) 500 MG tablet Take 500 mg by mouth daily.        Marland Kitchen aspirin 81 MG tablet Take 81 mg by mouth daily.        Marland Kitchen  b complex vitamins capsule Take 1 capsule by mouth daily.        . carvedilol (COREG) 12.5 MG tablet Take 1 tablet (12.5 mg total) by mouth 2 (two) times daily with a meal.  60 tablet  11  . furosemide (LASIX) 40 MG tablet Take 1 tablet (40 mg total) by mouth daily.  90 tablet  3  . isosorbide mononitrate (IMDUR) 30 MG CR tablet Take 1 tablet (30 mg total) by mouth every morning.  30 tablet  11  . lisinopril (PRINIVIL,ZESTRIL) 10 MG tablet Take 1 tablet (10 mg total) by mouth 2 (two) times daily.  180 tablet  3  . omeprazole (PRILOSEC) 20 MG capsule Take 1 capsule (20 mg total) by mouth 2 (two) times daily.  180 capsule  3  . PLAVIX 75 MG tablet TAKE 1 TABLET BY MOUTH ONCE A DAY  90 tablet  1  . pravastatin (PRAVACHOL) 40 MG tablet Take 1 tablet (40 mg total) by mouth daily.  90 tablet  3  . VITAMIN D, CHOLECALCIFEROL, PO Take 1 capsule by mouth daily.         History   Social History  . Marital Status: Married    Spouse Name:  N/A    Number of Children: 3  . Years of Education: N/A   Occupational History  . retired, Doctor, hospital   .     Social History Main Topics  . Smoking status: Former Smoker    Types: Cigarettes    Quit date: 02/15/1967  . Smokeless tobacco: Not on file  . Alcohol Use: No  . Drug Use: No  . Sexually Active: Not on file   Other Topics Concern  . 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 Cancer Father     Prostate  . Heart disease Brother     x2    Past Medical History  Diagnosis Date  . Hypertension   . Hyperlipidemia   . Anemia of chronic disease 04/2009    hgb 8...transfusion 04/2009  . CHF (congestive heart failure) 04/07/09    CHF while in hospital February, 2011, EF 45%  . Esophageal stricture       Dilatation and VA, / dilatation later by Dr. Arlyce Dice  . CKD (chronic kidney disease), stage II   . Carotid artery disease     Total right carotid, 60-79% LICA Dr. Edilia Bo, July, 2011 / left carotid endarterectomy January, 2012     . Community acquired pneumonia   . GERD (gastroesophageal reflux disease)   . Gout   . BPH (benign prostatic hyperplasia)   . Impaired fasting glucose   . Ejection fraction     45% by history  . Chest pain     Nuclear, April, 2011, no scar or ischemia    Past Surgical History  Procedure Date  . Cataract extraction   . Hernia repair   . Tonsillectomy   . Esophageal dilation     x3  . Carotid endarterectomy LEFT    03/10/2010    ROS  Patient denies fever, chills, headache, sweats, rash, change in vision, change in hearing, cough, nausea vomiting, urinary symptoms.  All of the systems are reviewed and are negative.  PHYSICAL EXAM Patient is with his wife today.  He is oriented to person time and place.  Affect is normal.  Head is atraumatic.  There is no jugular venous distention.  Lungs are clear.  Respiratory effort is unlabored.  Cardiac exam reveals S1 and S2.  No clicks or significant murmurs.  The abdomen is soft there is no peripheral edema.  There is no musculoskeletal deformities.  No skin rashes. Filed Vitals:   11/25/10 1459  BP: 120/46  Pulse: 75  Height: 5\' 7"  (1.702 m)  Weight: 138 lb (62.596 kg)    EKG is not done today.  EKGs from his stress nuclear scan are reviewed.  They did show significant ST changes. ASSESSMENT & PLAN

## 2010-11-25 NOTE — Assessment & Plan Note (Signed)
The patient's hemoglobin is down to 9.4.  He had been transfused for a much lower hemoglobin in 2011.  I have asked him to followup with Dr.Letvak concerning his anemia.  I will see him back fairly soon to be sure that he is remaining stable.

## 2010-11-25 NOTE — Assessment & Plan Note (Signed)
Blood pressure is controlled. No change in therapy. 

## 2010-12-03 ENCOUNTER — Ambulatory Visit (INDEPENDENT_AMBULATORY_CARE_PROVIDER_SITE_OTHER): Payer: Medicare Other | Admitting: Internal Medicine

## 2010-12-03 ENCOUNTER — Encounter: Payer: Self-pay | Admitting: Internal Medicine

## 2010-12-03 DIAGNOSIS — R209 Unspecified disturbances of skin sensation: Secondary | ICD-10-CM

## 2010-12-03 DIAGNOSIS — D638 Anemia in other chronic diseases classified elsewhere: Secondary | ICD-10-CM

## 2010-12-03 DIAGNOSIS — R200 Anesthesia of skin: Secondary | ICD-10-CM

## 2010-12-03 LAB — CBC WITH DIFFERENTIAL/PLATELET
Basophils Absolute: 0 10*3/uL (ref 0.0–0.1)
Lymphocytes Relative: 35.7 % (ref 12.0–46.0)
Monocytes Relative: 7.8 % (ref 3.0–12.0)
Neutrophils Relative %: 53.7 % (ref 43.0–77.0)
Platelets: 160 10*3/uL (ref 150.0–400.0)
RDW: 14.1 % (ref 11.5–14.6)

## 2010-12-03 LAB — IBC PANEL
Iron: 47 ug/dL (ref 42–165)
Transferrin: 228 mg/dL (ref 212.0–360.0)

## 2010-12-03 LAB — BASIC METABOLIC PANEL
BUN: 53 mg/dL — ABNORMAL HIGH (ref 6–23)
Calcium: 8.9 mg/dL (ref 8.4–10.5)
GFR: 38.61 mL/min — ABNORMAL LOW (ref >60.00)
Glucose, Bld: 122 mg/dL — ABNORMAL HIGH (ref 70–99)

## 2010-12-03 LAB — VITAMIN B12: Vitamin B-12: 647 pg/mL (ref 211–911)

## 2010-12-03 LAB — FERRITIN: Ferritin: 205.1 ng/mL (ref 22.0–322.0)

## 2010-12-03 NOTE — Progress Notes (Signed)
Subjective:    Patient ID: Anthony Werner, male    DOB: 1924/04/07, 77 y.o.   MRN: [id number]  HPI Had stress test Doesn't appear to have nuclear images suggesting ischemia DOE is better---continues to work in yard regularly  Otherwise is feeling okay Potassium was up Still with anemia  Eating well Weight stable  Current Outpatient Prescriptions on File Prior to Visit  Medication Sig Dispense Refill  . amLODipine (NORVASC) 5 MG tablet Take 1 tablet (5 mg total) by mouth daily.  30 tablet  6  . Ascorbic Acid (VITAMIN C) 500 MG tablet Take 500 mg by mouth daily.        Marland Kitchen aspirin 81 MG tablet Take 81 mg by mouth daily.        Marland Kitchen b complex vitamins capsule Take 1 capsule by mouth daily.        . carvedilol (COREG) 12.5 MG tablet Take 1 tablet (12.5 mg total) by mouth 2 (two) times daily with a meal.  60 tablet  11  . furosemide (LASIX) 40 MG tablet Take 1 tablet (40 mg total) by mouth daily.  90 tablet  3  . isosorbide mononitrate (IMDUR) 30 MG CR tablet Take 1 tablet (30 mg total) by mouth every morning.  30 tablet  11  . lisinopril (PRINIVIL,ZESTRIL) 10 MG tablet Take 1 tablet (10 mg total) by mouth 2 (two) times daily.  180 tablet  3  . omeprazole (PRILOSEC) 20 MG capsule Take 1 capsule (20 mg total) by mouth 2 (two) times daily.  180 capsule  3  . PLAVIX 75 MG tablet TAKE 1 TABLET BY MOUTH ONCE A DAY  90 tablet  1  . pravastatin (PRAVACHOL) 40 MG tablet Take 1 tablet (40 mg total) by mouth daily.  90 tablet  3  . VITAMIN D, CHOLECALCIFEROL, PO Take 1 capsule by mouth daily.         Allergies  Allergen Reactions  . Atenolol   . Diltiazem   . Hydrochlorothiazide   . Iron   . Labetalol   . Lovastatin   . Niacin   . Simvastatin   . Terazosin     Past Medical History  Diagnosis Date  . Hypertension   . Hyperlipidemia   . Anemia of chronic disease 04/2009    hgb 8...transfusion 04/2009  . CHF (congestive heart failure) 04/07/09    CHF while in hospital February, 2011, EF 45%    . Esophageal stricture     Dilatation and VA, / dilatation later by Dr. Arlyce Dice  . CKD (chronic kidney disease), stage II   . Carotid artery disease     Total right carotid, 60-79% LICA Dr. Edilia Bo, July, 2011 / left carotid endarterectomy January, 2012     . Community acquired pneumonia   . GERD (gastroesophageal reflux disease)   . Gout   . BPH (benign prostatic hyperplasia)   . Impaired fasting glucose   . Ejection fraction     45% by history  . Chest pain     Nuclear, April, 2011, no scar or ischemia    Past Surgical History  Procedure Date  . Cataract extraction   . Hernia repair   . Tonsillectomy   . Esophageal dilation     x3  . Carotid endarterectomy LEFT    03/10/2010    Family History  Problem Relation Age of Onset  . Cancer Father     Prostate  . Heart disease Brother     x2  History   Social History  . Marital Status: Married    Spouse Name: N/A    Number of Children: 3  . Years of Education: N/A   Occupational History  . retired, Doctor, hospital   .     Social History Main Topics  . Smoking status: Former Smoker    Types: Cigarettes    Quit date: 02/15/1967  . Smokeless tobacco: Not on file  . Alcohol Use: No  . Drug Use: No  . Sexually Active: Not on file   Other Topics Concern  . 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 No distress.  Neck: Normal range of motion.  Cardiovascular: Normal rate and regular rhythm.  Exam reveals no gallop.   Murmur heard.      Gr 2/6 systolic murmur at apex  Pulmonary/Chest: Effort normal and breath sounds normal. No respiratory distress. He has no wheezes. He  has no rales.  Musculoskeletal: He exhibits no edema and no tenderness.  Lymphadenopathy:    He has no cervical adenopathy.  Psychiatric: He has a normal mood and affect. His behavior is normal. Judgment and thought content normal.          Assessment & Plan:

## 2010-12-03 NOTE — Assessment & Plan Note (Signed)
Ongoing problems Lab Results  Component Value Date   HGB 9.4* 11/12/2010   Will make sure it hasn't gone done Related to renal disease Check iron also

## 2010-12-03 NOTE — Progress Notes (Signed)
Addended by: Alvina Chou on: 12/03/2010 11:48 AM   Modules accepted: Orders

## 2010-12-03 NOTE — Assessment & Plan Note (Signed)
Will recheck creat and potassium

## 2010-12-06 ENCOUNTER — Other Ambulatory Visit: Payer: Self-pay | Admitting: Internal Medicine

## 2010-12-13 ENCOUNTER — Telehealth: Payer: Self-pay | Admitting: Cardiology

## 2010-12-13 MED ORDER — CLOPIDOGREL BISULFATE 75 MG PO TABS: 75.0000 mg | ORAL_TABLET | Freq: Every day | ORAL | Status: DC

## 2010-12-13 NOTE — Telephone Encounter (Signed)
Pt stated he was started on Clopidogrel 75 mg.  Please call this into his phar that is listed.

## 2010-12-15 ENCOUNTER — Other Ambulatory Visit: Payer: Self-pay | Admitting: Cardiology

## 2010-12-21 ENCOUNTER — Ambulatory Visit (INDEPENDENT_AMBULATORY_CARE_PROVIDER_SITE_OTHER): Payer: Medicare Other | Admitting: Cardiology

## 2010-12-21 ENCOUNTER — Encounter: Payer: Self-pay | Admitting: Cardiology

## 2010-12-21 DIAGNOSIS — R079 Chest pain, unspecified: Secondary | ICD-10-CM

## 2010-12-21 NOTE — Patient Instructions (Signed)
Your physician recommends that you schedule a follow-up appointment in: 6 months  

## 2010-12-21 NOTE — Assessment & Plan Note (Signed)
He has had no recurrent chest pain.  He is returned to mowing his yard.  I will not push for further testing at this time.  I will see him back in 6 months for followup

## 2010-12-21 NOTE — Progress Notes (Signed)
HPI Anthony Werner is seen for cardiology followup.  I saw him last on November 25, 2010.  He has known vascular disease.  We have never proven coronary disease.  He has not had cardiac catheterization.  He had some concerning chest discomfort.  His nuclear scan from November 23, 2010 revealed no significant ischemia.  He did have some chest pain and some EKG changes.  I was concerned about the possibility of a false negative study.  There was no dilatation of the ventricle.  When I saw him last he felt fine and he returned mowing his yard without chest pain.  I am seeing him back today to be sure that he remains stable.  He has not had any recurrent problems. Allergies  Allergen Reactions  . Atenolol   . Diltiazem   . Hydrochlorothiazide   . Labetalol   . Lovastatin   . Niacin   . Simvastatin   . Terazosin   . Iron     Just had constipation    Current Outpatient Prescriptions  Medication Sig Dispense Refill  . amLODipine (NORVASC) 5 MG tablet Take 1 tablet (5 mg total) by mouth daily.  30 tablet  6  . Ascorbic Acid (VITAMIN C) 500 MG tablet Take 500 mg by mouth daily.        Marland Kitchen aspirin 81 MG tablet Take 81 mg by mouth daily.        Marland Kitchen b complex vitamins capsule Take 1 capsule by mouth daily.        . carvedilol (COREG) 12.5 MG tablet Take 1 tablet (12.5 mg total) by mouth 2 (two) times daily with a meal.  60 tablet  11  . clopidogrel (PLAVIX) 75 MG tablet Take 1 tablet (75 mg total) by mouth daily.  90 tablet  2  . furosemide (LASIX) 40 MG tablet Take 1 tablet (40 mg total) by mouth daily.  90 tablet  3  . isosorbide mononitrate (IMDUR) 30 MG CR tablet Take 1 tablet (30 mg total) by mouth every morning.  30 tablet  11  . lisinopril (PRINIVIL,ZESTRIL) 10 MG tablet Take 1 tablet (10 mg total) by mouth 2 (two) times daily.  180 tablet  3  . omeprazole (PRILOSEC) 20 MG capsule Take 1 capsule (20 mg total) by mouth 2 (two) times daily.  180 capsule  3  . pravastatin (PRAVACHOL) 40 MG tablet Take 1 tablet  (40 mg total) by mouth daily.  90 tablet  3  . VITAMIN D, CHOLECALCIFEROL, PO Take 1 capsule by mouth daily.         History   Social History  . Marital Status: Married    Spouse Name: N/A    Number of Children: 3  . Years of Education: N/A   Occupational History  . retired, Doctor, hospital   .     Social History Main Topics  . Smoking status: Former Smoker    Types: Cigarettes    Quit date: 02/15/1967  . Smokeless tobacco: Not on file  . Alcohol Use: No  . Drug Use: No  . Sexually Active: Not on file   Other Topics Concern  . 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 Cancer Father     Prostate  .  Heart disease Brother     x2    Past Medical History  Diagnosis Date  . Hypertension   . Hyperlipidemia   . Anemia of chronic disease 04/2009    hgb 8...transfusion 04/2009  . CHF (congestive heart failure) 04/07/09    CHF while in hospital February, 2011, EF 45%  . Esophageal stricture     Dilatation and [facility], / dilatation later by Dr. Arlyce Dice  . CKD (chronic kidney disease), stage II   . Carotid artery disease     Total right carotid, 60-79% LICA Dr. Edilia Bo, July, 2011 / left carotid endarterectomy January, 2012     . Community acquired pneumonia   . GERD (gastroesophageal reflux disease)   . Gout   . BPH (benign prostatic hyperplasia)   . Impaired fasting glucose   . Ejection fraction     45% by history  . Chest pain     Nuclear, April, 2011, no scar or ischemia    Past Surgical History  Procedure Date  . Cataract extraction   . Hernia repair   . Tonsillectomy   . Esophageal dilation     x3  . Carotid endarterectomy LEFT    03/10/2010    ROS  Anthony Werner denies fever, chills, headache, sweats, rash, and in  vision, change in hearing, chest pain, cough, nausea vomiting, urinary symptoms.  All other systems are reviewed and are negative. PHYSICAL EXAM He is stable today.  He is here with his wife.  He is oriented to person time and place.  Affect is normal.  We have a nice talk about the joy mowing yards.There is no jugular venous distention.  Lungs are clear respiratory effort is nonlabored.  Cardiac exam reveals S1 and S2.  No clicks or significant murmurs.  The abdomen is soft.  There is no peripheral edema. Filed Vitals:   12/21/10 1058  BP: 120/56  Pulse: 64  Height: 5\' 7"  (1.702 m)  Weight: 139 lb (63.05 kg)      ASSESSMENT & PLAN

## 2011-01-06 ENCOUNTER — Other Ambulatory Visit: Payer: Self-pay | Admitting: Cardiology

## 2011-01-12 ENCOUNTER — Other Ambulatory Visit: Payer: Self-pay

## 2011-01-12 ENCOUNTER — Telehealth: Payer: Self-pay | Admitting: Cardiology

## 2011-01-12 DIAGNOSIS — I10 Essential (primary) hypertension: Secondary | ICD-10-CM

## 2011-01-12 DIAGNOSIS — N182 Chronic kidney disease, stage 2 (mild): Secondary | ICD-10-CM

## 2011-01-12 MED ORDER — CARVEDILOL 12.5 MG PO TABS: 12.5000 mg | ORAL_TABLET | Freq: Two times a day (BID) | ORAL | Status: DC

## 2011-01-12 NOTE — Telephone Encounter (Signed)
Requesting refill of Carvedilol 12.5mg .  Refill sent to [facility] in [place], [place].

## 2011-01-12 NOTE — Telephone Encounter (Signed)
They have questions and concerns regarding carvedilol and dosing

## 2011-01-13 ENCOUNTER — Other Ambulatory Visit: Payer: Self-pay | Admitting: Cardiology

## 2011-01-18 ENCOUNTER — Ambulatory Visit: Payer: Medicare Other | Admitting: Cardiology

## 2011-02-09 ENCOUNTER — Other Ambulatory Visit (HOSPITAL_COMMUNITY): Payer: Self-pay | Admitting: *Deleted

## 2011-02-09 ENCOUNTER — Encounter (HOSPITAL_COMMUNITY)
Admission: RE | Admit: 2011-02-09 | Discharge: 2011-02-09 | Disposition: A | Discharge status: Home / Self Care | Payer: Medicare Other | Source: Ambulatory Visit | Attending: Nephrology | Admitting: Nephrology

## 2011-02-09 DIAGNOSIS — D649 Anemia, unspecified: Secondary | ICD-10-CM | POA: Insufficient documentation

## 2011-02-09 MED ORDER — FERUMOXYTOL INJECTION 510 MG/17 ML: 510.0000 mg | Freq: Once | INTRAVENOUS | Status: DC

## 2011-02-09 MED ORDER — FERUMOXYTOL INJECTION 510 MG/17 ML
INTRAVENOUS | Status: AC
  Administered 2011-02-09: 510 mg via INTRAVENOUS
  Filled 2011-02-09: qty 17

## 2011-02-09 NOTE — Progress Notes (Signed)
Discussed pt intolerance to ioron with the pt. He stated he gets constipated and is ok with getting the IV iron today

## 2011-03-07 ENCOUNTER — Other Ambulatory Visit: Payer: Self-pay

## 2011-03-07 DIAGNOSIS — N182 Chronic kidney disease, stage 2 (mild): Secondary | ICD-10-CM

## 2011-03-07 DIAGNOSIS — I10 Essential (primary) hypertension: Secondary | ICD-10-CM

## 2011-03-07 MED ORDER — CARVEDILOL 12.5 MG PO TABS: 12.5000 mg | ORAL_TABLET | Freq: Two times a day (BID) | ORAL | Status: DC

## 2011-03-07 NOTE — Telephone Encounter (Signed)
..   Requested Prescriptions   Signed Prescriptions Disp Refills  . carvedilol (COREG) 12.5 MG tablet 60 tablet 1    Sig: Take 1 tablet (12.5 mg total) by mouth 2 (two) times daily with a meal.    Authorizing Provider: Myrtis Ser, JEFFREY D    Ordering User: Christella Hartigan, Brnadon Eoff Judie Petit

## 2011-03-11 ENCOUNTER — Telehealth: Payer: Self-pay | Admitting: Internal Medicine

## 2011-03-11 NOTE — Telephone Encounter (Signed)
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

## 2011-03-11 NOTE — Telephone Encounter (Signed)
Discussed with wife Sounds like conjunctival hemorrhage Can go to Saturday clinic if desired  [facility], Please check on Monday I can see him the prn

## 2011-03-15 NOTE — Telephone Encounter (Signed)
Spoke with patient & wife, they will be going to see a optometrist, wanted some recommendations, I suggested [facility]

## 2011-03-16 NOTE — Telephone Encounter (Signed)
Per his wife he is

## 2011-03-16 NOTE — Telephone Encounter (Signed)
That is fine Make sure he is getting evaluation

## 2011-04-01 ENCOUNTER — Ambulatory Visit: Payer: Medicare Other | Admitting: Internal Medicine

## 2011-04-08 ENCOUNTER — Ambulatory Visit (INDEPENDENT_AMBULATORY_CARE_PROVIDER_SITE_OTHER): Payer: Medicare Other | Admitting: Internal Medicine

## 2011-04-08 ENCOUNTER — Encounter: Payer: Self-pay | Admitting: Internal Medicine

## 2011-04-08 ENCOUNTER — Ambulatory Visit (INDEPENDENT_AMBULATORY_CARE_PROVIDER_SITE_OTHER)
Admission: RE | Admit: 2011-04-08 | Discharge: 2011-04-08 | Disposition: A | Discharge status: Home / Self Care | Payer: Medicare Other | Source: Ambulatory Visit | Attending: Internal Medicine | Admitting: Internal Medicine

## 2011-04-08 VITALS — BP 142/60 | HR 59 | Temp 98.0°F | Ht 67.0 in | Wt 130.0 lb

## 2011-04-08 DIAGNOSIS — Z113 Encounter for screening for infections with a predominantly sexual mode of transmission: Secondary | ICD-10-CM

## 2011-04-08 DIAGNOSIS — H2010 Chronic iridocyclitis, unspecified eye: Secondary | ICD-10-CM

## 2011-04-08 DIAGNOSIS — E785 Hyperlipidemia, unspecified: Secondary | ICD-10-CM

## 2011-04-08 DIAGNOSIS — I10 Essential (primary) hypertension: Secondary | ICD-10-CM

## 2011-04-08 NOTE — Progress Notes (Signed)
Subjective:    Patient ID: Anthony Werner, male    DOB: October 26, 1924, 78 y.o.   MRN: [id number]  HPI Doing okay Here with wife  Reviewed info from eye doctors---[facility] and [place] Had granulomatous uveitis Needs testing to R/O TB but likely has sarcoid No history of this in the past  No chest pain No SOB No edema Energy levels are good ---feels they are better with iron and shots (?aranesp)  Current Outpatient Prescriptions on File Prior to Visit  Medication Sig Dispense Refill  . amLODipine (NORVASC) 5 MG tablet TAKE 1 TABLET (5 MG TOTAL) BY MOUTH DAILY.  30 tablet  5  . Ascorbic Acid (VITAMIN C) 500 MG tablet Take 500 mg by mouth daily.        Marland Kitchen aspirin 81 MG tablet Take 81 mg by mouth daily.        Marland Kitchen b complex vitamins capsule Take 1 capsule by mouth daily.        . carvedilol (COREG) 12.5 MG tablet Take 1 tablet (12.5 mg total) by mouth 2 (two) times daily with a meal.  60 tablet  1  . clopidogrel (PLAVIX) 75 MG tablet Take 1 tablet (75 mg total) by mouth daily.  90 tablet  2  . furosemide (LASIX) 40 MG tablet Take 1 tablet (40 mg total) by mouth daily.  90 tablet  3  . isosorbide mononitrate (IMDUR) 30 MG CR tablet Take 1 tablet (30 mg total) by mouth every morning.  30 tablet  11  . lisinopril (PRINIVIL,ZESTRIL) 10 MG tablet Take 1 tablet (10 mg total) by mouth 2 (two) times daily.  180 tablet  3  . omeprazole (PRILOSEC) 20 MG capsule Take 1 capsule (20 mg total) by mouth 2 (two) times daily.  180 capsule  3  . pravastatin (PRAVACHOL) 40 MG tablet Take 1 tablet (40 mg total) by mouth daily.  90 tablet  3  . VITAMIN D, CHOLECALCIFEROL, PO Take 1 capsule by mouth daily.        Current Facility-Administered Medications on File Prior to Visit  Medication Dose Route Frequency Provider Last Rate Last Dose  . ferumoxytol [facility]) injection 510 mg  510 mg Intravenous Once Dyke Maes, MD        Allergies  Allergen Reactions  . Atenolol   . Diltiazem   . Hydrochlorothiazide    . Labetalol   . Lovastatin   . Niacin   . Simvastatin   . Terazosin   . Iron     Just had constipation    Past Medical History  Diagnosis Date  . Hypertension   . Hyperlipidemia   . Anemia of chronic disease 04/2009    hgb 8...transfusion 04/2009  . CHF (congestive heart failure) 04/07/09    CHF while in hospital February, 2011, EF 45%  . Esophageal stricture     Dilatation and [facility], / dilatation later by Dr. Arlyce Dice  . CKD (chronic kidney disease), stage II   . Carotid artery disease     Total right carotid, 60-79% LICA Dr. Edilia Bo, July, 2011 / left carotid endarterectomy January, 2012     . Community acquired pneumonia   . GERD (gastroesophageal reflux disease)   . Gout   . BPH (benign prostatic hyperplasia)   . Impaired fasting glucose   . Ejection fraction     45% by history  . Chest pain     Nuclear, April, 2011, no scar or ischemia    Past Surgical History  Procedure Date  . Cataract extraction   . Hernia repair   . Tonsillectomy   . Esophageal dilation     x3  . Carotid endarterectomy LEFT    03/10/2010    Family History  Problem Relation Age of Onset  . Cancer Father     Prostate  . Heart disease Brother     x2    History   Social History  . Marital Status: Married    Spouse Name: N/A    Number of Children: 3  . Years of Education: N/A   Occupational History  . retired, Doctor, hospital   .     Social History Main Topics  . Smoking status: Former Smoker    Types: Cigarettes    Quit date: 02/15/1967  . Smokeless tobacco: Not on file  . Alcohol Use: No  . Drug Use: No  . Sexually Active: Not on file   Other Topics Concern  . 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 Discussed glucerna     Objective:   Physical Exam  Constitutional: He appears well-developed. No distress.  Neck: Normal range of motion. Neck supple.  Cardiovascular: Normal rate, regular rhythm and normal heart sounds.  Exam reveals no gallop.   No murmur heard. Pulmonary/Chest: Effort normal and breath sounds normal. No respiratory distress. He has no wheezes. He has no rales.  Abdominal: Soft. There is no tenderness.  Musculoskeletal: He exhibits no edema and no tenderness.  Lymphadenopathy:    He has no cervical adenopathy.  Skin: No rash noted. No erythema.  Psychiatric: He has a normal mood and affect. His behavior is normal.          Assessment & Plan:

## 2011-04-08 NOTE — Assessment & Plan Note (Signed)
Continues with nephrology Now on erythropoietin and energy levels better

## 2011-04-08 NOTE — Progress Notes (Signed)
Addended by: Alvina Chou on: 04/08/2011 11:29 AM   Modules accepted: Orders

## 2011-04-08 NOTE — Assessment & Plan Note (Signed)
/   BP Readings from Last 3 Encounters:  04/08/11 142/60  02/09/11 128/49  12/21/10 120/56   Generally controlled No changes

## 2011-04-08 NOTE — Assessment & Plan Note (Signed)
Lab Results  Component Value Date   LDLCALC 81 07/21/2010   Good control Labs next time

## 2011-04-08 NOTE — Assessment & Plan Note (Signed)
New diagnosis Likely sarcoid Will check labs and CXR as prescribed by ophtho  Will wait on zostavax till off prednisone at this point

## 2011-04-09 LAB — ANGIOTENSIN CONVERTING ENZYME: Angiotensin-Converting Enzyme: 3 U/L — ABNORMAL LOW (ref 8–52)

## 2011-04-11 LAB — HSV 2 ANTIBODY, IGG: HSV 2 Glycoprotein G Ab, IgG: <0.1 IV

## 2011-06-13 ENCOUNTER — Other Ambulatory Visit: Payer: Self-pay | Admitting: Cardiology

## 2011-06-14 ENCOUNTER — Other Ambulatory Visit (HOSPITAL_COMMUNITY): Payer: Self-pay | Admitting: *Deleted

## 2011-06-15 ENCOUNTER — Encounter (HOSPITAL_COMMUNITY)
Admission: RE | Admit: 2011-06-15 | Discharge: 2011-06-15 | Disposition: A | Discharge status: Home / Self Care | Payer: Medicare Other | Source: Ambulatory Visit | Attending: Nephrology | Admitting: Nephrology

## 2011-06-15 DIAGNOSIS — N183 Chronic kidney disease, stage 3 unspecified: Secondary | ICD-10-CM | POA: Insufficient documentation

## 2011-06-15 DIAGNOSIS — D649 Anemia, unspecified: Secondary | ICD-10-CM | POA: Insufficient documentation

## 2011-06-15 LAB — POCT HEMOGLOBIN-HEMACUE: Hemoglobin: 11.3 g/dL — ABNORMAL LOW (ref 13.0–17.0)

## 2011-06-15 MED ORDER — EPOETIN ALFA 20000 UNIT/ML IJ SOLN
INTRAMUSCULAR | Status: AC
  Administered 2011-06-15: 20000 [IU] via SUBCUTANEOUS
  Filled 2011-06-15: qty 1

## 2011-06-15 MED ORDER — EPOETIN ALFA 20000 UNIT/ML IJ SOLN
20000.0000 [IU] | INTRAMUSCULAR | Status: DC
  Administered 2011-06-15: 20000 [IU] via SUBCUTANEOUS

## 2011-06-24 ENCOUNTER — Telehealth: Payer: Self-pay | Admitting: Cardiology

## 2011-06-24 ENCOUNTER — Other Ambulatory Visit: Payer: Self-pay | Admitting: Cardiology

## 2011-06-24 MED ORDER — LISINOPRIL 10 MG PO TABS: 10.0000 mg | ORAL_TABLET | Freq: Every day | ORAL | Status: DC

## 2011-06-24 NOTE — Telephone Encounter (Signed)
New Problem:    Patient's wife called in needing a 90 day refill of her husband's lisinopril (PRINIVIL,ZESTRIL) 10 MG tablet at the [facility] in [place] [phone] this one time.  Please call back if you have any questions.

## 2011-07-05 ENCOUNTER — Encounter (HOSPITAL_COMMUNITY)
Admission: RE | Admit: 2011-07-05 | Discharge: 2011-07-05 | Disposition: A | Discharge status: Home / Self Care | Payer: Medicare Other | Source: Ambulatory Visit | Attending: Nephrology | Admitting: Nephrology

## 2011-07-05 LAB — POCT HEMOGLOBIN-HEMACUE: Hemoglobin: 10.9 g/dL — ABNORMAL LOW (ref 13.0–17.0)

## 2011-07-05 MED ORDER — EPOETIN ALFA 20000 UNIT/ML IJ SOLN
INTRAMUSCULAR | Status: AC
  Administered 2011-07-05: 20000 [IU] via SUBCUTANEOUS
  Filled 2011-07-05: qty 1

## 2011-07-05 MED ORDER — EPOETIN ALFA 20000 UNIT/ML IJ SOLN
20000.0000 [IU] | INTRAMUSCULAR | Status: DC
  Administered 2011-07-05: 20000 [IU] via SUBCUTANEOUS

## 2011-07-06 ENCOUNTER — Ambulatory Visit: Payer: Medicare Other | Admitting: Cardiology

## 2011-07-13 ENCOUNTER — Encounter: Payer: Self-pay | Admitting: Cardiology

## 2011-07-13 ENCOUNTER — Ambulatory Visit (INDEPENDENT_AMBULATORY_CARE_PROVIDER_SITE_OTHER): Payer: Medicare Other | Admitting: Cardiology

## 2011-07-13 DIAGNOSIS — I779 Disorder of arteries and arterioles, unspecified: Secondary | ICD-10-CM

## 2011-07-13 DIAGNOSIS — R079 Chest pain, unspecified: Secondary | ICD-10-CM

## 2011-07-13 DIAGNOSIS — I509 Heart failure, unspecified: Secondary | ICD-10-CM

## 2011-07-13 MED ORDER — LISINOPRIL 10 MG PO TABS: 10.0000 mg | ORAL_TABLET | Freq: Two times a day (BID) | ORAL | Status: DC

## 2011-07-13 NOTE — Assessment & Plan Note (Signed)
He is not had any recurrent chest pain. His nuclear scan in October, 2012 revealed no significant ischemia.

## 2011-07-13 NOTE — Assessment & Plan Note (Signed)
His carotid disease is followed carefully by vascular surgery.

## 2011-07-13 NOTE — Progress Notes (Signed)
HPI Patient is seen today for cardiology followup. He's doing well. He's not having any chest pain. I saw him last November, 2012. He had a carotid endarterectomy in January, 2012. He is scheduled to see Dr. Edilia Bo probably in August of this year. He had seen him in August of 2012. He's not having any chest pain. He's not having any significant shortness of breath. There is no syncope or presyncope.   Allergies  Allergen Reactions  . Atenolol   . Diltiazem   . Hydrochlorothiazide   . Labetalol   . Lovastatin   . Niacin   . Simvastatin   . Terazosin   . Iron     Just had constipation    Current Outpatient Prescriptions  Medication Sig Dispense Refill  . amLODipine (NORVASC) 5 MG tablet TAKE 1 TABLET (5 MG TOTAL) BY MOUTH DAILY.  30 tablet  5  . Ascorbic Acid (VITAMIN C) 500 MG tablet Take 500 mg by mouth daily.        Marland Kitchen aspirin 81 MG tablet Take 81 mg by mouth daily.        Marland Kitchen atropine 1 % ophthalmic solution       . b complex vitamins capsule Take 1 capsule by mouth daily.        . carvedilol (COREG) 12.5 MG tablet TAKE 1 TABLET (12.5 MG TOTAL) BY MOUTH 2 (TWO) TIMES DAILY WITH A MEAL.  180 tablet  1  . clopidogrel (PLAVIX) 75 MG tablet Take 1 tablet (75 mg total) by mouth daily.  90 tablet  2  . famciclovir (FAMVIR) 500 MG tablet Take 500 mg by mouth 3 (three) times daily.       . furosemide (LASIX) 40 MG tablet Take 1 tablet (40 mg total) by mouth daily.  90 tablet  3  . isosorbide mononitrate (IMDUR) 30 MG CR tablet Take 1 tablet (30 mg total) by mouth every morning.  30 tablet  11  . lisinopril (PRINIVIL,ZESTRIL) 10 MG tablet Take 10 mg by mouth 2 (two) times daily.      Marland Kitchen omeprazole (PRILOSEC) 20 MG capsule Take 1 capsule (20 mg total) by mouth 2 (two) times daily.  180 capsule  3  . pravastatin (PRAVACHOL) 40 MG tablet Take 1 tablet (40 mg total) by mouth daily.  90 tablet  3  . VITAMIN D, CHOLECALCIFEROL, PO Take 1 capsule by mouth daily.       Marland Kitchen DISCONTD: lisinopril  (PRINIVIL,ZESTRIL) 10 MG tablet Take 1 tablet (10 mg total) by mouth daily.  180 tablet  1   No current facility-administered medications for this visit.   Facility-Administered Medications Ordered in Other Visits  Medication Dose Route Frequency Provider Last Rate Last Dose  . ferumoxytol [facility]) injection 510 mg  510 mg Intravenous Once Dyke Maes, MD        History   Social History  . Marital Status: Married    Spouse Name: N/A    Number of Children: 3  . Years of Education: N/A   Occupational History  . retired, Doctor, hospital   .     Social History Main Topics  . Smoking status: Former Smoker    Types: Cigarettes    Quit date: 02/15/1967  . Smokeless tobacco: Not on file  . Alcohol Use: No  . Drug Use: No  . Sexually Active: Not on file   Other Topics Concern  . 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 Cancer Father     Prostate  . Heart disease Brother     x2    Past Medical History  Diagnosis Date  . Hypertension   . Hyperlipidemia   . Anemia of chronic disease 04/2009    hgb 8...transfusion 04/2009  . CHF (congestive heart failure) 04/07/09    CHF while in hospital February, 2011, EF 45%  . Esophageal stricture     Dilatation and [facility], / dilatation later by Dr. Arlyce Dice  . CKD (chronic kidney disease), stage II   . Carotid artery disease     Total right carotid, 60-79% LICA Dr. Edilia Bo, July, 2011 / left carotid endarterectomy January, 2012     . Community acquired pneumonia   . GERD (gastroesophageal reflux disease)   . Gout   . BPH (benign prostatic hyperplasia)   . Impaired fasting glucose   . Ejection fraction     45% by history  . Chest pain     Nuclear, April, 2011, no scar or ischemia     Past Surgical History  Procedure Date  . Cataract extraction   . Hernia repair   . Tonsillectomy   . Esophageal dilation     x3  . Carotid endarterectomy LEFT    03/10/2010    ROS Patient denies fever, chills, headache, sweats, rash, change in vision, change in hearing, chest pain, cough, nausea vomiting, urinary symptoms. All other systems are reviewed and are negative.  PHYSICAL EXAM Patient is oriented to person time and place. Affect is normal. He's here with his wife. Lungs are clear. Respiratory effort is nonlabored. There is no jugular venous distention. He does have a right carotid bruit despite the history of a total right carotid occlusion. Cardiac exam reveals S1 and S2. There no clicks or significant murmurs. Abdomen is soft. There is no peripheral edema. Filed Vitals:   07/13/11 1055  BP: 112/44  Pulse: 70  Height: 5\' 7"  (1.702 m)  Weight: 135 lb (61.236 kg)   EKG is done today and reviewed by me. There is mild sinus bradycardia. There are no acute changes.  ASSESSMENT & PLAN

## 2011-07-13 NOTE — Patient Instructions (Signed)
Your physician wants you to follow-up in: 1 year. You will receive a reminder letter in the mail two months in advance. If you don't receive a letter, please call our office to schedule the follow-up appointment.  

## 2011-07-26 ENCOUNTER — Encounter (HOSPITAL_COMMUNITY)
Admission: RE | Admit: 2011-07-26 | Discharge: 2011-07-26 | Disposition: A | Discharge status: Home / Self Care | Payer: Medicare Other | Source: Ambulatory Visit | Attending: Nephrology | Admitting: Nephrology

## 2011-07-26 DIAGNOSIS — D649 Anemia, unspecified: Secondary | ICD-10-CM | POA: Insufficient documentation

## 2011-07-26 DIAGNOSIS — N183 Chronic kidney disease, stage 3 unspecified: Secondary | ICD-10-CM | POA: Insufficient documentation

## 2011-07-26 MED ORDER — EPOETIN ALFA 20000 UNIT/ML IJ SOLN
20000.0000 [IU] | INTRAMUSCULAR | Status: DC
  Administered 2011-07-26: 20000 [IU] via SUBCUTANEOUS

## 2011-07-26 MED ORDER — EPOETIN ALFA 20000 UNIT/ML IJ SOLN
INTRAMUSCULAR | Status: AC
  Administered 2011-07-26: 20000 [IU] via SUBCUTANEOUS
  Filled 2011-07-26: qty 1

## 2011-08-16 ENCOUNTER — Encounter (HOSPITAL_COMMUNITY)
Admission: RE | Admit: 2011-08-16 | Discharge: 2011-08-16 | Disposition: A | Discharge status: Home / Self Care | Payer: Medicare Other | Source: Ambulatory Visit | Attending: Nephrology | Admitting: Nephrology

## 2011-08-16 DIAGNOSIS — N183 Chronic kidney disease, stage 3 unspecified: Secondary | ICD-10-CM | POA: Insufficient documentation

## 2011-08-16 DIAGNOSIS — D649 Anemia, unspecified: Secondary | ICD-10-CM | POA: Insufficient documentation

## 2011-08-16 LAB — POCT HEMOGLOBIN-HEMACUE: Hemoglobin: 10.3 g/dL — ABNORMAL LOW (ref 13.0–17.0)

## 2011-08-16 LAB — IRON AND TIBC: TIBC: 259 ug/dL (ref 215–435)

## 2011-08-16 MED ORDER — EPOETIN ALFA 20000 UNIT/ML IJ SOLN
INTRAMUSCULAR | Status: AC
  Filled 2011-08-16: qty 1

## 2011-08-16 MED ORDER — EPOETIN ALFA 20000 UNIT/ML IJ SOLN
20000.0000 [IU] | INTRAMUSCULAR | Status: DC
  Administered 2011-08-16: 20000 [IU] via SUBCUTANEOUS

## 2011-09-07 ENCOUNTER — Other Ambulatory Visit: Payer: Self-pay | Admitting: Family Medicine

## 2011-09-07 ENCOUNTER — Other Ambulatory Visit: Payer: Self-pay | Admitting: Cardiology

## 2011-09-07 NOTE — Telephone Encounter (Signed)
Ok to refill? Last OV was 04/08/11. Labs was 08/16/11.

## 2011-09-07 NOTE — Telephone Encounter (Signed)
..   Requested Prescriptions   Pending Prescriptions Disp Refills  . clopidogrel (PLAVIX) 75 MG tablet [Pharmacy Med Name: CLOPIDOGREL 75 MG TABLET] 90 tablet 2    Sig: TAKE 1 TABLET (75 MG TOTAL) BY MOUTH DAILY.

## 2011-09-07 NOTE — Telephone Encounter (Signed)
rx sent to pharmacy by e-script  

## 2011-09-08 ENCOUNTER — Encounter (HOSPITAL_COMMUNITY): Payer: Medicare Other

## 2011-09-12 ENCOUNTER — Other Ambulatory Visit: Payer: Self-pay | Admitting: Internal Medicine

## 2011-09-23 ENCOUNTER — Other Ambulatory Visit (HOSPITAL_COMMUNITY): Payer: Self-pay | Admitting: *Deleted

## 2011-09-26 ENCOUNTER — Encounter (HOSPITAL_COMMUNITY)
Admission: RE | Admit: 2011-09-26 | Discharge: 2011-09-26 | Disposition: A | Discharge status: Home / Self Care | Payer: Medicare Other | Source: Ambulatory Visit | Attending: Nephrology | Admitting: Nephrology

## 2011-09-26 DIAGNOSIS — D649 Anemia, unspecified: Secondary | ICD-10-CM | POA: Insufficient documentation

## 2011-09-26 DIAGNOSIS — N183 Chronic kidney disease, stage 3 unspecified: Secondary | ICD-10-CM | POA: Insufficient documentation

## 2011-09-26 LAB — POCT HEMOGLOBIN-HEMACUE: Hemoglobin: 10.4 g/dL — ABNORMAL LOW (ref 13.0–17.0)

## 2011-09-26 MED ORDER — EPOETIN ALFA 20000 UNIT/ML IJ SOLN
20000.0000 [IU] | INTRAMUSCULAR | Status: DC
  Administered 2011-09-26: 20000 [IU] via SUBCUTANEOUS
  Filled 2011-09-26: qty 1

## 2011-10-07 ENCOUNTER — Ambulatory Visit (INDEPENDENT_AMBULATORY_CARE_PROVIDER_SITE_OTHER): Payer: Medicare Other | Admitting: Internal Medicine

## 2011-10-07 ENCOUNTER — Encounter: Payer: Self-pay | Admitting: Internal Medicine

## 2011-10-07 VITALS — BP 128/60 | HR 62 | Temp 97.8°F | Ht 67.0 in | Wt 134.0 lb

## 2011-10-07 DIAGNOSIS — I10 Essential (primary) hypertension: Secondary | ICD-10-CM

## 2011-10-07 DIAGNOSIS — H2010 Chronic iridocyclitis, unspecified eye: Secondary | ICD-10-CM

## 2011-10-07 DIAGNOSIS — E785 Hyperlipidemia, unspecified: Secondary | ICD-10-CM

## 2011-10-07 DIAGNOSIS — R7301 Impaired fasting glucose: Secondary | ICD-10-CM

## 2011-10-07 DIAGNOSIS — N400 Enlarged prostate without lower urinary tract symptoms: Secondary | ICD-10-CM

## 2011-10-07 DIAGNOSIS — K219 Gastro-esophageal reflux disease without esophagitis: Secondary | ICD-10-CM

## 2011-10-07 LAB — CBC WITH DIFFERENTIAL/PLATELET
Basophils Relative: 0.4 % (ref 0.0–3.0)
HCT: 32.6 % — ABNORMAL LOW (ref 39.0–52.0)
Hemoglobin: 10.6 g/dL — ABNORMAL LOW (ref 13.0–17.0)
Lymphocytes Relative: 33.1 % (ref 12.0–46.0)
Monocytes Relative: 9.4 % (ref 3.0–12.0)
Neutro Abs: 2.1 10*3/uL (ref 1.4–7.7)
RBC: 3.47 Mil/uL — ABNORMAL LOW (ref 4.22–5.81)

## 2011-10-07 LAB — BASIC METABOLIC PANEL WITH GFR
BUN: 42 mg/dL — ABNORMAL HIGH (ref 6–23)
CO2: 27 meq/L (ref 19–32)
Calcium: 9.4 mg/dL (ref 8.4–10.5)
Chloride: 104 meq/L (ref 96–112)
Creatinine, Ser: 1.7 mg/dL — ABNORMAL HIGH (ref 0.4–1.5)
GFR: 50.82 mL/min — ABNORMAL LOW
Glucose, Bld: 105 mg/dL — ABNORMAL HIGH (ref 70–99)
Potassium: 4.6 meq/L (ref 3.5–5.1)
Sodium: 139 meq/L (ref 135–145)

## 2011-10-07 LAB — HEPATIC FUNCTION PANEL
ALT: 13 U/L (ref 0–53)
AST: 20 U/L (ref 0–37)
Alkaline Phosphatase: 145 U/L — ABNORMAL HIGH (ref 39–117)
Bilirubin, Direct: 0 mg/dL (ref 0.0–0.3)
Total Protein: 7 g/dL (ref 6.0–8.3)

## 2011-10-07 LAB — HEMOGLOBIN A1C: Hgb A1c MFr Bld: 6.1 % (ref 4.6–6.5)

## 2011-10-07 LAB — LIPID PANEL
HDL: 62.1 mg/dL (ref >39.00)
Total CHOL/HDL Ratio: 2
VLDL: 15.4 mg/dL (ref 0.0–40.0)

## 2011-10-07 NOTE — Assessment & Plan Note (Signed)
Follows with Dr Mattingly 

## 2011-10-07 NOTE — Assessment & Plan Note (Signed)
BP Readings from Last 3 Encounters:  10/07/11 128/60  09/26/11 171/66  08/16/11 133/50   Good control now Amlodipine increased by Dr Briant Cedar

## 2011-10-07 NOTE — Assessment & Plan Note (Signed)
Mild symptoms only No Rx needed 

## 2011-10-07 NOTE — Assessment & Plan Note (Signed)
Stomach quiet on the omeprazole

## 2011-10-07 NOTE — Assessment & Plan Note (Signed)
No problems with statin Due for labs 

## 2011-10-07 NOTE — Assessment & Plan Note (Signed)
Stable on steroid drops rx given for zostavax

## 2011-10-07 NOTE — Progress Notes (Signed)
Subjective:    Patient ID: Anthony Werner, male    DOB: March 12, 1924, 78 y.o.   MRN: [id number]  HPI Here with wife Still on steroid eye drops for uveitis This seems to be controlled Rx given for zostavax as recommended by ophtho  No heart trouble No chest pain No SOB No sig edema Goes to gym occ with wife and does yard work Amlodipine dose increased by Dr Briant Cedar  Nocturia stable at 2/night No sig daytime urgency or frequency  No myalgias No stomach trouble on the statin Current Outpatient Prescriptions on File Prior to Visit  Medication Sig Dispense Refill  . Ascorbic Acid (VITAMIN C) 500 MG tablet Take 500 mg by mouth daily.        Marland Kitchen aspirin 81 MG tablet Take 81 mg by mouth daily.        Marland Kitchen b complex vitamins capsule Take 1 capsule by mouth daily.        . carvedilol (COREG) 12.5 MG tablet TAKE 1 TABLET (12.5 MG TOTAL) BY MOUTH 2 (TWO) TIMES DAILY WITH A MEAL.  180 tablet  1  . clopidogrel (PLAVIX) 75 MG tablet TAKE 1 TABLET (75 MG TOTAL) BY MOUTH DAILY.  90 tablet  2  . famciclovir (FAMVIR) 500 MG tablet Take 500 mg by mouth 3 (three) times daily.       . furosemide (LASIX) 40 MG tablet TAKE 1 TABLET (40 MG TOTAL) BY MOUTH DAILY.  90 tablet  3  . isosorbide mononitrate (IMDUR) 30 MG CR tablet Take 1 tablet (30 mg total) by mouth every morning.  30 tablet  11  . lisinopril (PRINIVIL,ZESTRIL) 10 MG tablet Take 1 tablet (10 mg total) by mouth 2 (two) times daily.  180 tablet  3  . omeprazole (PRILOSEC) 20 MG capsule TAKE 1 CAPSULE (20 MG TOTAL) BY MOUTH 2 (TWO) TIMES DAILY.  180 capsule  3  . pravastatin (PRAVACHOL) 40 MG tablet TAKE 1 TABLET (40 MG TOTAL) BY MOUTH DAILY.  90 tablet  3  . VITAMIN D, CHOLECALCIFEROL, PO Take 1 capsule by mouth daily.        Current Facility-Administered Medications on File Prior to Visit  Medication Dose Route Frequency Provider Last Rate Last Dose  . ferumoxytol [facility]) injection 510 mg  510 mg Intravenous Once Dyke Maes, MD         Allergies  Allergen Reactions  . Atenolol   . Diltiazem   . Hydrochlorothiazide   . Labetalol   . Lovastatin   . Niacin   . Simvastatin   . Terazosin   . Iron     Just had constipation    Past Medical History  Diagnosis Date  . Hypertension   . Hyperlipidemia   . Anemia of chronic disease 04/2009    hgb 8...transfusion 04/2009  . CHF (congestive heart failure) 04/07/09    CHF while in hospital February, 2011, EF 45%  . Esophageal stricture     Dilatation and [facility], / dilatation later by Dr. Arlyce Dice  . CKD (chronic kidney disease), stage II   . Carotid artery disease     Total right carotid, 60-79% LICA Dr. Edilia Bo, July, 2011 / left carotid endarterectomy January, 2012     . Community acquired pneumonia   . GERD (gastroesophageal reflux disease)   . Gout   . BPH (benign prostatic hyperplasia)   . Impaired fasting glucose   . Ejection fraction     45% by history  . Chest pain  Nuclear, April, 2011, no scar or ischemia    Past Surgical History  Procedure Date  . Cataract extraction   . Hernia repair   . Tonsillectomy   . Esophageal dilation     x3  . Carotid endarterectomy LEFT    03/10/2010    Family History  Problem Relation Age of Onset  . Cancer Father     Prostate  . Heart disease Brother     x2    History   Social History  . Marital Status: Married    Spouse Name: N/A    Number of Children: 3  . Years of Education: N/A   Occupational History  . retired, Doctor, hospital   .     Social History Main Topics  . Smoking status: Former Smoker    Types: Cigarettes    Quit date: 02/15/1967  . Smokeless tobacco: Never Used  . Alcohol Use: No  . Drug Use: No  . Sexually Active: Not on file   Other Topics Concern  . 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 No distress.  Neck: Normal range of motion. Neck supple. No thyromegaly present.  Cardiovascular: Normal rate, regular rhythm and normal heart sounds.  Exam reveals no gallop.   No murmur heard. Pulmonary/Chest: Effort normal and breath sounds normal. No respiratory distress. He has no wheezes. He has no rales.  Abdominal: Soft. There is no tenderness.  Musculoskeletal: He exhibits no edema and no tenderness.  Lymphadenopathy:    He has no cervical adenopathy.  Psychiatric: He has a normal mood and affect. His behavior is normal. Thought content normal.          Assessment & Plan:

## 2011-10-11 ENCOUNTER — Encounter: Payer: Self-pay | Admitting: *Deleted

## 2011-10-11 ENCOUNTER — Other Ambulatory Visit: Payer: Self-pay | Admitting: *Deleted

## 2011-10-11 DIAGNOSIS — Z48812 Encounter for surgical aftercare following surgery on the circulatory system: Secondary | ICD-10-CM

## 2011-10-11 DIAGNOSIS — I6529 Occlusion and stenosis of unspecified carotid artery: Secondary | ICD-10-CM

## 2011-10-18 ENCOUNTER — Encounter: Payer: Self-pay | Admitting: Neurosurgery

## 2011-10-18 ENCOUNTER — Encounter (HOSPITAL_COMMUNITY)
Admission: RE | Admit: 2011-10-18 | Discharge: 2011-10-18 | Disposition: A | Discharge status: Home / Self Care | Payer: Medicare Other | Source: Ambulatory Visit | Attending: Nephrology | Admitting: Nephrology

## 2011-10-18 DIAGNOSIS — N183 Chronic kidney disease, stage 3 unspecified: Secondary | ICD-10-CM | POA: Insufficient documentation

## 2011-10-18 DIAGNOSIS — D649 Anemia, unspecified: Secondary | ICD-10-CM | POA: Insufficient documentation

## 2011-10-18 MED ORDER — EPOETIN ALFA 20000 UNIT/ML IJ SOLN
20000.0000 [IU] | INTRAMUSCULAR | Status: DC
  Administered 2011-10-18: 20000 [IU] via SUBCUTANEOUS
  Filled 2011-10-18: qty 1

## 2011-10-19 ENCOUNTER — Ambulatory Visit (INDEPENDENT_AMBULATORY_CARE_PROVIDER_SITE_OTHER): Payer: Medicare Other | Admitting: *Deleted

## 2011-10-19 ENCOUNTER — Encounter: Payer: Self-pay | Admitting: Neurosurgery

## 2011-10-19 ENCOUNTER — Ambulatory Visit (INDEPENDENT_AMBULATORY_CARE_PROVIDER_SITE_OTHER): Payer: Medicare Other | Admitting: Neurosurgery

## 2011-10-19 VITALS — BP 139/60 | HR 52 | Resp 16 | Ht 67.0 in | Wt 136.2 lb

## 2011-10-19 DIAGNOSIS — I6529 Occlusion and stenosis of unspecified carotid artery: Secondary | ICD-10-CM | POA: Insufficient documentation

## 2011-10-19 DIAGNOSIS — Z48812 Encounter for surgical aftercare following surgery on the circulatory system: Secondary | ICD-10-CM

## 2011-10-19 NOTE — Progress Notes (Signed)
[facility]  Carotid Office Note  CC: Annual carotid surveillance Referring Physician: Edilia Bo  History of Present Illness: 78 year old male patient of Dr. Edilia Bo status post left CEA in 2012. The patient does have a known right ICA occlusion. The patient denies any signs or symptoms of CVA, TIA, amaurosis fugax or any neural deficit.  Past Medical History  Diagnosis Date  . Hypertension   . Hyperlipidemia   . Anemia of chronic disease 04/2009    hgb 8...transfusion 04/2009  . CHF (congestive heart failure) 04/07/09    CHF while in hospital February, 2011, EF 45%  . Esophageal stricture     Dilatation and [facility], / dilatation later by Dr. Arlyce Dice  . CKD (chronic kidney disease), stage II   . Carotid artery disease     Total right carotid, 60-79% LICA Dr. Edilia Bo, July, 2011 / left carotid endarterectomy January, 2012     . Community acquired pneumonia   . GERD (gastroesophageal reflux disease)   . Gout   . BPH (benign prostatic hyperplasia)   . Impaired fasting glucose   . Ejection fraction     45% by history  . Chest pain     Nuclear, April, 2011, no scar or ischemia    ROS: [x]  Positive   [ ]  Denies    General: [ ]  Weight loss, [ ]  Fever, [ ]  chills Neurologic: [ ]  Dizziness, [ ]  Blackouts, [ ]  Seizure [ ]  Stroke, [ ]  "Mini stroke", [ ]  Slurred speech, [ ]  Temporary blindness; [ ]  weakness in arms or legs, [ ]  Hoarseness Cardiac: [ ]  Chest pain/pressure, [ ]  Shortness of breath at rest [ ]  Shortness of breath with exertion, [ ]  Atrial fibrillation or irregular heartbeat Vascular: [ ]  Pain in legs with walking, [ ]  Pain in legs at rest, [ ]  Pain in legs at night,  [ ]  Non-healing ulcer, [ ]  Blood clot in vein/DVT,   Pulmonary: [ ]  Home oxygen, [ ]  Productive cough, [ ]  Coughing up blood, [ ]  Asthma,  [ ]  Wheezing Musculoskeletal:  [ ]  Arthritis, [ ]  Low back pain, [ ]  Joint pain Hematologic: [ ]  Easy Bruising, [ ]  Anemia; [ ]   Hepatitis Gastrointestinal: [ ]  Blood in stool, [ ]  Gastroesophageal Reflux/heartburn, [ ]  Trouble swallowing Urinary: [ ]  chronic Kidney disease, [ ]  on HD - [ ]  MWF or [ ]  TTHS, [ ]  Burning with urination, [ ]  Difficulty urinating Skin: [ ]  Rashes, [ ]  Wounds Psychological: [ ]  Anxiety, [ ]  Depression   Social History History  Substance Use Topics  . Smoking status: Former Smoker    Types: Cigarettes    Quit date: 02/15/1967  . Smokeless tobacco: Never Used  . Alcohol Use: No    Family History Family History  Problem Relation Age of Onset  . Cancer Father     Prostate  . Heart disease Brother     x2    Allergies  Allergen Reactions  . Atenolol   . Diltiazem   . Hydrochlorothiazide   . Labetalol   . Lovastatin   . Niacin   . Simvastatin   . Terazosin   . Iron     Just had constipation    Current Outpatient Prescriptions  Medication Sig Dispense Refill  . amLODipine (NORVASC) 10 MG tablet Take 10 mg by mouth daily.      . Ascorbic Acid (VITAMIN C) 500 MG tablet Take 500 mg by mouth daily.        Marland Kitchen  aspirin 81 MG tablet Take 81 mg by mouth daily.        Marland Kitchen b complex vitamins capsule Take 1 capsule by mouth daily.        . carvedilol (COREG) 12.5 MG tablet TAKE 1 TABLET (12.5 MG TOTAL) BY MOUTH 2 (TWO) TIMES DAILY WITH A MEAL.  180 tablet  1  . clopidogrel (PLAVIX) 75 MG tablet TAKE 1 TABLET (75 MG TOTAL) BY MOUTH DAILY.  90 tablet  2  . famciclovir (FAMVIR) 500 MG tablet Take 500 mg by mouth 3 (three) times daily.       . furosemide (LASIX) 40 MG tablet TAKE 1 TABLET (40 MG TOTAL) BY MOUTH DAILY.  90 tablet  3  . isosorbide mononitrate (IMDUR) 30 MG CR tablet Take 1 tablet (30 mg total) by mouth every morning.  30 tablet  11  . lisinopril (PRINIVIL,ZESTRIL) 10 MG tablet Take 1 tablet (10 mg total) by mouth 2 (two) times daily.  180 tablet  3  . omeprazole (PRILOSEC) 20 MG capsule TAKE 1 CAPSULE (20 MG TOTAL) BY MOUTH 2 (TWO) TIMES DAILY.  180 capsule  3  .  pravastatin (PRAVACHOL) 40 MG tablet TAKE 1 TABLET (40 MG TOTAL) BY MOUTH DAILY.  90 tablet  3  . prednisoLONE acetate (PRED FORTE) 1 % ophthalmic suspension Place 1 drop into the right eye 2 (two) times daily.       Marland Kitchen VITAMIN D, CHOLECALCIFEROL, PO Take 1 capsule by mouth daily.        No current facility-administered medications for this visit.   Facility-Administered Medications Ordered in Other Visits  Medication Dose Route Frequency Provider Last Rate Last Dose  . ferumoxytol [facility]) injection 510 mg  510 mg Intravenous Once Dyke Maes, MD      . DISCONTD: epoetin alfa (EPOGEN,PROCRIT) injection 20,000 Units  20,000 Units Subcutaneous Q21 days Dyke Maes, MD   20,000 Units at 10/18/11 1610    Physical Examination  There were no vitals filed for this visit.  There is no height or weight on file to calculate BMI.  General:  WDWN in NAD Gait: Normal HEENT: WNL Eyes: Pupils equal Pulmonary: normal non-labored breathing , without Rales, rhonchi,  wheezing Cardiac: RRR, without  Murmurs, rubs or gallops; Abdomen: soft, NT, no masses Skin: no rashes, ulcers noted  Vascular Exam Pulses: 3+ radial pulses bilaterally Carotid bruits: Carotid pulse to auscultation on the left known right occlusion Extremities without ischemic changes, no Gangrene , no cellulitis; no open wounds;  Musculoskeletal: no muscle wasting or atrophy   Neurologic: A&O X 3; Appropriate Affect ; SENSATION: normal; MOTOR FUNCTION:  moving all extremities equally. Speech is fluent/normal  Non-Invasive Vascular Imaging CAROTID DUPLEX 10/19/2011  Right ICA 0ccluded stenosis Left ICA 0 - 19% stenosis   ASSESSMENT/PLAN: Asymptomatic patient almost 2 year status post left CEA and doing well. The patient will followup in one year with repeat carotid duplex. The patient's questions were encouraged and answered, he is in agreement with this plan.  Lauree Chandler ANP   Clinic MD: Edilia Bo

## 2011-11-03 ENCOUNTER — Other Ambulatory Visit: Payer: Self-pay | Admitting: Cardiology

## 2011-11-08 ENCOUNTER — Encounter (HOSPITAL_COMMUNITY)
Admission: RE | Admit: 2011-11-08 | Discharge: 2011-11-08 | Disposition: A | Discharge status: Home / Self Care | Payer: Medicare Other | Source: Ambulatory Visit | Attending: Nephrology | Admitting: Nephrology

## 2011-11-08 MED ORDER — EPOETIN ALFA 20000 UNIT/ML IJ SOLN
20000.0000 [IU] | INTRAMUSCULAR | Status: DC
  Administered 2011-11-08: 20000 [IU] via SUBCUTANEOUS
  Filled 2011-11-08: qty 1

## 2011-11-29 ENCOUNTER — Encounter (HOSPITAL_COMMUNITY)
Admission: RE | Admit: 2011-11-29 | Discharge: 2011-11-29 | Disposition: A | Discharge status: Home / Self Care | Payer: Medicare Other | Source: Ambulatory Visit | Attending: Nephrology | Admitting: Nephrology

## 2011-11-29 DIAGNOSIS — D649 Anemia, unspecified: Secondary | ICD-10-CM | POA: Insufficient documentation

## 2011-11-29 DIAGNOSIS — N183 Chronic kidney disease, stage 3 unspecified: Secondary | ICD-10-CM | POA: Insufficient documentation

## 2011-11-29 LAB — FERRITIN: Ferritin: 402 ng/mL — ABNORMAL HIGH (ref 22–322)

## 2011-11-29 LAB — IRON AND TIBC: UIBC: 195 ug/dL (ref 125–400)

## 2011-11-29 MED ORDER — EPOETIN ALFA 20000 UNIT/ML IJ SOLN
INTRAMUSCULAR | Status: AC
  Filled 2011-11-29: qty 1

## 2011-11-29 MED ORDER — EPOETIN ALFA 20000 UNIT/ML IJ SOLN
20000.0000 [IU] | INTRAMUSCULAR | Status: DC
  Administered 2011-11-29: 20000 [IU] via SUBCUTANEOUS

## 2011-12-10 ENCOUNTER — Other Ambulatory Visit: Payer: Self-pay | Admitting: Cardiology

## 2011-12-14 ENCOUNTER — Other Ambulatory Visit: Payer: Self-pay | Admitting: *Deleted

## 2011-12-14 DIAGNOSIS — Z48812 Encounter for surgical aftercare following surgery on the circulatory system: Secondary | ICD-10-CM

## 2011-12-14 DIAGNOSIS — I6529 Occlusion and stenosis of unspecified carotid artery: Secondary | ICD-10-CM

## 2011-12-20 ENCOUNTER — Encounter (HOSPITAL_COMMUNITY)
Admission: RE | Admit: 2011-12-20 | Discharge: 2011-12-20 | Disposition: A | Discharge status: Home / Self Care | Payer: Medicare Other | Source: Ambulatory Visit | Attending: Nephrology | Admitting: Nephrology

## 2011-12-20 DIAGNOSIS — N183 Chronic kidney disease, stage 3 unspecified: Secondary | ICD-10-CM | POA: Insufficient documentation

## 2011-12-20 DIAGNOSIS — D649 Anemia, unspecified: Secondary | ICD-10-CM | POA: Insufficient documentation

## 2011-12-20 LAB — POCT HEMOGLOBIN-HEMACUE: Hemoglobin: 10.6 g/dL — ABNORMAL LOW (ref 13.0–17.0)

## 2011-12-20 MED ORDER — EPOETIN ALFA 20000 UNIT/ML IJ SOLN
INTRAMUSCULAR | Status: AC
  Filled 2011-12-20: qty 1

## 2011-12-20 MED ORDER — EPOETIN ALFA 20000 UNIT/ML IJ SOLN
20000.0000 [IU] | INTRAMUSCULAR | Status: DC
  Administered 2011-12-20: 20000 [IU] via SUBCUTANEOUS

## 2012-01-09 ENCOUNTER — Other Ambulatory Visit (HOSPITAL_COMMUNITY): Payer: Self-pay | Admitting: *Deleted

## 2012-01-10 ENCOUNTER — Encounter (HOSPITAL_COMMUNITY)
Admission: RE | Admit: 2012-01-10 | Discharge: 2012-01-10 | Disposition: A | Discharge status: Home / Self Care | Payer: Medicare Other | Source: Ambulatory Visit | Attending: Nephrology | Admitting: Nephrology

## 2012-01-10 LAB — POCT HEMOGLOBIN-HEMACUE: Hemoglobin: 10.8 g/dL — ABNORMAL LOW (ref 13.0–17.0)

## 2012-01-10 MED ORDER — EPOETIN ALFA 20000 UNIT/ML IJ SOLN
INTRAMUSCULAR | Status: AC
  Administered 2012-01-10: 20000 [IU] via SUBCUTANEOUS
  Filled 2012-01-10: qty 1

## 2012-01-10 MED ORDER — EPOETIN ALFA 20000 UNIT/ML IJ SOLN
20000.0000 [IU] | INTRAMUSCULAR | Status: DC
  Administered 2012-01-10: 20000 [IU] via SUBCUTANEOUS

## 2012-01-31 ENCOUNTER — Encounter (HOSPITAL_COMMUNITY)
Admission: RE | Admit: 2012-01-31 | Discharge: 2012-01-31 | Disposition: A | Discharge status: Home / Self Care | Payer: Medicare Other | Source: Ambulatory Visit | Attending: Nephrology | Admitting: Nephrology

## 2012-01-31 DIAGNOSIS — D649 Anemia, unspecified: Secondary | ICD-10-CM | POA: Insufficient documentation

## 2012-01-31 DIAGNOSIS — N183 Chronic kidney disease, stage 3 unspecified: Secondary | ICD-10-CM | POA: Insufficient documentation

## 2012-01-31 LAB — POCT HEMOGLOBIN-HEMACUE: Hemoglobin: 11.3 g/dL — ABNORMAL LOW (ref 13.0–17.0)

## 2012-01-31 MED ORDER — EPOETIN ALFA 20000 UNIT/ML IJ SOLN
INTRAMUSCULAR | Status: AC
  Filled 2012-01-31: qty 1

## 2012-01-31 MED ORDER — EPOETIN ALFA 20000 UNIT/ML IJ SOLN
20000.0000 [IU] | INTRAMUSCULAR | Status: DC
  Administered 2012-01-31: 20000 [IU] via SUBCUTANEOUS

## 2012-02-21 ENCOUNTER — Encounter (HOSPITAL_COMMUNITY)
Admission: RE | Admit: 2012-02-21 | Discharge: 2012-02-21 | Disposition: A | Discharge status: Home / Self Care | Payer: Medicare Other | Source: Ambulatory Visit | Attending: Nephrology | Admitting: Nephrology

## 2012-02-21 DIAGNOSIS — D649 Anemia, unspecified: Secondary | ICD-10-CM | POA: Insufficient documentation

## 2012-02-21 DIAGNOSIS — N183 Chronic kidney disease, stage 3 unspecified: Secondary | ICD-10-CM | POA: Insufficient documentation

## 2012-02-21 LAB — POCT HEMOGLOBIN-HEMACUE: Hemoglobin: 10.5 g/dL — ABNORMAL LOW (ref 13.0–17.0)

## 2012-02-21 LAB — IRON AND TIBC
Iron: 49 ug/dL (ref 42–135)
TIBC: 271 ug/dL (ref 215–435)
UIBC: 222 ug/dL (ref 125–400)

## 2012-02-21 LAB — FERRITIN: Ferritin: 359 ng/mL — ABNORMAL HIGH (ref 22–322)

## 2012-02-21 MED ORDER — EPOETIN ALFA 20000 UNIT/ML IJ SOLN
INTRAMUSCULAR | Status: AC
  Administered 2012-02-21: 20000 [IU] via SUBCUTANEOUS
  Filled 2012-02-21: qty 1

## 2012-02-21 MED ORDER — EPOETIN ALFA 20000 UNIT/ML IJ SOLN
20000.0000 [IU] | INTRAMUSCULAR | Status: DC
  Administered 2012-02-21: 20000 [IU] via SUBCUTANEOUS

## 2012-03-14 ENCOUNTER — Other Ambulatory Visit (HOSPITAL_COMMUNITY): Payer: Self-pay | Admitting: *Deleted

## 2012-03-14 ENCOUNTER — Encounter (HOSPITAL_COMMUNITY)
Admission: RE | Admit: 2012-03-14 | Discharge: 2012-03-14 | Disposition: A | Discharge status: Home / Self Care | Payer: Medicare Other | Source: Ambulatory Visit | Attending: Nephrology | Admitting: Nephrology

## 2012-03-14 MED ORDER — SODIUM CHLORIDE 0.9 % IV SOLN
INTRAVENOUS | Status: DC
  Administered 2012-03-14: 09:00:00 via INTRAVENOUS

## 2012-03-14 MED ORDER — EPOETIN ALFA 20000 UNIT/ML IJ SOLN
INTRAMUSCULAR | Status: AC
  Administered 2012-03-14: 20000 [IU] via SUBCUTANEOUS
  Filled 2012-03-14: qty 1

## 2012-03-14 MED ORDER — FERUMOXYTOL INJECTION 510 MG/17 ML
INTRAVENOUS | Status: AC
  Administered 2012-03-14: 510 mg via INTRAVENOUS
  Filled 2012-03-14: qty 17

## 2012-03-14 MED ORDER — EPOETIN ALFA 20000 UNIT/ML IJ SOLN
20000.0000 [IU] | INTRAMUSCULAR | Status: DC
  Administered 2012-03-14: 20000 [IU] via SUBCUTANEOUS

## 2012-03-14 MED ORDER — FERUMOXYTOL INJECTION 510 MG/17 ML
510.0000 mg | INTRAVENOUS | Status: DC
  Administered 2012-03-14: 510 mg via INTRAVENOUS

## 2012-04-04 ENCOUNTER — Encounter (HOSPITAL_COMMUNITY)
Admission: RE | Admit: 2012-04-04 | Discharge: 2012-04-04 | Disposition: A | Discharge status: Home / Self Care | Payer: Medicare Other | Source: Ambulatory Visit | Attending: Nephrology | Admitting: Nephrology

## 2012-04-04 DIAGNOSIS — N183 Chronic kidney disease, stage 3 unspecified: Secondary | ICD-10-CM | POA: Insufficient documentation

## 2012-04-04 DIAGNOSIS — D649 Anemia, unspecified: Secondary | ICD-10-CM | POA: Insufficient documentation

## 2012-04-04 LAB — POCT HEMOGLOBIN-HEMACUE: Hemoglobin: 11.6 g/dL — ABNORMAL LOW (ref 13.0–17.0)

## 2012-04-04 MED ORDER — EPOETIN ALFA 20000 UNIT/ML IJ SOLN
INTRAMUSCULAR | Status: AC
  Filled 2012-04-04: qty 1

## 2012-04-04 MED ORDER — SODIUM CHLORIDE 0.9 % IV SOLN
INTRAVENOUS | Status: AC
  Administered 2012-04-04: 09:00:00 via INTRAVENOUS

## 2012-04-04 MED ORDER — FERUMOXYTOL INJECTION 510 MG/17 ML
INTRAVENOUS | Status: AC
  Administered 2012-04-04: 510 mg via INTRAVENOUS
  Filled 2012-04-04: qty 17

## 2012-04-04 MED ORDER — FERUMOXYTOL INJECTION 510 MG/17 ML
510.0000 mg | INTRAVENOUS | Status: AC
  Administered 2012-04-04: 510 mg via INTRAVENOUS

## 2012-04-04 MED ORDER — EPOETIN ALFA 20000 UNIT/ML IJ SOLN
20000.0000 [IU] | INTRAMUSCULAR | Status: DC
  Administered 2012-04-04: 20000 [IU] via SUBCUTANEOUS

## 2012-04-09 ENCOUNTER — Ambulatory Visit (INDEPENDENT_AMBULATORY_CARE_PROVIDER_SITE_OTHER): Payer: Medicare Other | Admitting: Internal Medicine

## 2012-04-09 ENCOUNTER — Encounter: Payer: Self-pay | Admitting: Internal Medicine

## 2012-04-09 VITALS — BP 140/60 | HR 60 | Temp 98.0°F | Wt 136.0 lb

## 2012-04-09 DIAGNOSIS — Z1331 Encounter for screening for depression: Secondary | ICD-10-CM

## 2012-04-09 DIAGNOSIS — I10 Essential (primary) hypertension: Secondary | ICD-10-CM

## 2012-04-09 DIAGNOSIS — K219 Gastro-esophageal reflux disease without esophagitis: Secondary | ICD-10-CM

## 2012-04-09 DIAGNOSIS — E785 Hyperlipidemia, unspecified: Secondary | ICD-10-CM

## 2012-04-09 DIAGNOSIS — N400 Enlarged prostate without lower urinary tract symptoms: Secondary | ICD-10-CM

## 2012-04-09 LAB — CBC WITH DIFFERENTIAL/PLATELET
Basophils Relative: 0.8 % (ref 0.0–3.0)
Eosinophils Absolute: 0.1 10*3/uL (ref 0.0–0.7)
Hemoglobin: 11.5 g/dL — ABNORMAL LOW (ref 13.0–17.0)
Lymphocytes Relative: 31.9 % (ref 12.0–46.0)
Monocytes Relative: 9 % (ref 3.0–12.0)
Neutro Abs: 3 10*3/uL (ref 1.4–7.7)
Neutrophils Relative %: 57.1 % (ref 43.0–77.0)
RBC: 3.74 Mil/uL — ABNORMAL LOW (ref 4.22–5.81)
WBC: 5.3 10*3/uL (ref 4.5–10.5)

## 2012-04-09 LAB — BASIC METABOLIC PANEL
Calcium: 9.2 mg/dL (ref 8.4–10.5)
Creatinine, Ser: 1.6 mg/dL — ABNORMAL HIGH (ref 0.4–1.5)
GFR: 53.35 mL/min — ABNORMAL LOW (ref >60.00)
Sodium: 138 mEq/L (ref 135–145)

## 2012-04-09 NOTE — Assessment & Plan Note (Signed)
Voids okay without meds 

## 2012-04-09 NOTE — Assessment & Plan Note (Signed)
Lab Results  Component Value Date   LDLCALC 70 10/07/2011   At goal No changes needed

## 2012-04-09 NOTE — Assessment & Plan Note (Signed)
BP Readings from Last 3 Encounters:  04/09/12 140/60  04/04/12 104/48  03/14/12 124/50   Reasonable control No changes for now

## 2012-04-09 NOTE — Assessment & Plan Note (Signed)
Continues with nephrologist and Rx for anemia as well

## 2012-04-09 NOTE — Progress Notes (Signed)
Subjective:    Patient ID: Anthony Werner, male    DOB: 27-Oct-1924, 79 y.o.   MRN: [id number]  HPI Here with wife  A few weeks ago had runny nose, then coughing up sticky mucus Still mild cough and runny nose. He didn't feel sick ?allergy and tried OTC med  Still sees Dr Briant Cedar for kidneys Evaluated for erythropoietin regularly still ---has gotten iron the last 2 times Still gets fatigued easy--- easy DOE but no sig change lately Props up at times--mostly flat. ?some PND occasionally No leg swelling  Still uses the drops for uveitis Vision is okay  Voids okay Nocturia x 2-3---stable No sig daytime urgency or frequency  No heartburn No recent dysphagia Appetite is fine  Current Outpatient Prescriptions on File Prior to Visit  Medication Sig Dispense Refill  . amLODipine (NORVASC) 10 MG tablet Take 10 mg by mouth daily.      . Ascorbic Acid (VITAMIN C) 500 MG tablet Take 500 mg by mouth daily.        Marland Kitchen aspirin 81 MG tablet Take 81 mg by mouth daily.        Marland Kitchen b complex vitamins capsule Take 1 capsule by mouth daily.       . carvedilol (COREG) 12.5 MG tablet TAKE 1 TABLET (12.5 MG TOTAL) BY MOUTH 2 (TWO) TIMES DAILY WITH A MEAL.  180 tablet  2  . clopidogrel (PLAVIX) 75 MG tablet TAKE 1 TABLET (75 MG TOTAL) BY MOUTH DAILY.  90 tablet  2  . famciclovir (FAMVIR) 500 MG tablet Take 500 mg by mouth 2 (two) times daily. For 3-5 days as needed for cold sores      . furosemide (LASIX) 40 MG tablet TAKE 1 TABLET (40 MG TOTAL) BY MOUTH DAILY.  90 tablet  3  . isosorbide mononitrate (IMDUR) 30 MG 24 hr tablet TAKE 1 TABLET BY MOUTH EVERY MORNING.  30 tablet  8  . lisinopril (PRINIVIL,ZESTRIL) 10 MG tablet Take 1 tablet (10 mg total) by mouth 2 (two) times daily.  180 tablet  3  . omeprazole (PRILOSEC) 20 MG capsule TAKE 1 CAPSULE (20 MG TOTAL) BY MOUTH 2 (TWO) TIMES DAILY.  180 capsule  3  . pravastatin (PRAVACHOL) 40 MG tablet TAKE 1 TABLET (40 MG TOTAL) BY MOUTH DAILY.  90 tablet  3   . prednisoLONE acetate (PRED FORTE) 1 % ophthalmic suspension Place 1 drop into the right eye 2 (two) times daily.       Marland Kitchen VITAMIN D, CHOLECALCIFEROL, PO Take 1 capsule by mouth daily.        Current Facility-Administered Medications on File Prior to Visit  Medication Dose Route Frequency Provider Last Rate Last Dose  . ferumoxytol [facility]) injection 510 mg  510 mg Intravenous Once Dyke Maes, MD        Allergies  Allergen Reactions  . Atenolol   . Diltiazem   . Hydrochlorothiazide   . Labetalol   . Lovastatin   . Niacin   . Simvastatin   . Terazosin   . Iron     Just had constipation    Past Medical History  Diagnosis Date  . Hypertension   . Hyperlipidemia   . Anemia of chronic disease 04/2009    hgb 8...transfusion 04/2009  . CHF (congestive heart failure) 04/07/09    CHF while in hospital February, 2011, EF 45%  . Esophageal stricture     Dilatation and VA, / dilatation later by Dr. Arlyce Dice  .  CKD (chronic kidney disease), stage II   . Carotid artery disease     Total right carotid, 60-79% LICA Dr. Edilia Bo, July, 2011 / left carotid endarterectomy January, 2012     . Community acquired pneumonia   . GERD (gastroesophageal reflux disease)   . Gout   . BPH (benign prostatic hyperplasia)   . Impaired fasting glucose   . Ejection fraction     45% by history  . Chest pain     Nuclear, April, 2011, no scar or ischemia    Past Surgical History  Procedure Laterality Date  . Cataract extraction    . Hernia repair    . Tonsillectomy    . Esophageal dilation      x3  . Carotid endarterectomy  LEFT    03/10/2010  . Iron injections  Sept. 2013    Every 3 weeks    Family History  Problem Relation Age of Onset  . Cancer Father     Prostate  . Hypertension Father   . Heart disease Father     Heart Disease before age 48  . Heart disease Brother     x2  . Hypertension Brother   . Hypertension Mother   . Hypertension Sister   . Hypertension Daughter      History   Social History  . Marital Status: Married    Spouse Name: N/A    Number of Children: 3  . Years of Education: N/A   Occupational History  . retired, Doctor, hospital   .     Social History Main Topics  . Smoking status: Former Smoker    Types: Cigarettes    Quit date: 02/15/1967  . Smokeless tobacco: Never Used  . Alcohol Use: No  . Drug Use: No  . Sexually Active: Not on file   Other Topics Concern  . 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 Better if he sits up Had vascular evaluation for carotids---no action needed    Objective:   Physical Exam  Constitutional: He appears well-developed and well-nourished. No distress.  Neck: Normal range of motion. Neck supple.  Cardiovascular: Normal rate, regular rhythm and normal heart sounds.  Exam reveals no gallop.   No murmur heard. Pulses not palpable  Pulmonary/Chest: Effort normal and breath sounds normal. No respiratory distress. He has no wheezes. He has no rales.  Abdominal: Soft. There is no tenderness.  Musculoskeletal: He exhibits no edema and no tenderness.  Lymphadenopathy:    He has no cervical adenopathy.  Psychiatric: He has a normal mood and affect. His behavior is normal.          Assessment & Plan:

## 2012-04-09 NOTE — Assessment & Plan Note (Signed)
Quiet on PPI Needs to continue due to past stricture

## 2012-04-10 ENCOUNTER — Encounter: Payer: Self-pay | Admitting: *Deleted

## 2012-04-24 ENCOUNTER — Other Ambulatory Visit (HOSPITAL_COMMUNITY): Payer: Self-pay | Admitting: *Deleted

## 2012-04-25 ENCOUNTER — Encounter (HOSPITAL_COMMUNITY)
Admission: RE | Admit: 2012-04-25 | Discharge: 2012-04-25 | Disposition: A | Discharge status: Home / Self Care | Payer: Medicare Other | Source: Ambulatory Visit | Attending: Nephrology | Admitting: Nephrology

## 2012-04-25 DIAGNOSIS — N183 Chronic kidney disease, stage 3 unspecified: Secondary | ICD-10-CM | POA: Insufficient documentation

## 2012-04-25 DIAGNOSIS — D649 Anemia, unspecified: Secondary | ICD-10-CM | POA: Insufficient documentation

## 2012-04-25 MED ORDER — EPOETIN ALFA 20000 UNIT/ML IJ SOLN
20000.0000 [IU] | INTRAMUSCULAR | Status: DC
  Administered 2012-04-25: 20000 [IU] via SUBCUTANEOUS

## 2012-04-25 MED ORDER — EPOETIN ALFA 20000 UNIT/ML IJ SOLN
INTRAMUSCULAR | Status: AC
  Administered 2012-04-25: 20000 [IU] via SUBCUTANEOUS
  Filled 2012-04-25: qty 1

## 2012-05-16 ENCOUNTER — Encounter (HOSPITAL_COMMUNITY)
Admission: RE | Admit: 2012-05-16 | Discharge: 2012-05-16 | Disposition: A | Discharge status: Home / Self Care | Payer: Medicare Other | Source: Ambulatory Visit | Attending: Nephrology | Admitting: Nephrology

## 2012-05-16 ENCOUNTER — Encounter: Payer: Self-pay | Admitting: Internal Medicine

## 2012-05-16 ENCOUNTER — Ambulatory Visit (INDEPENDENT_AMBULATORY_CARE_PROVIDER_SITE_OTHER): Payer: Medicare Other | Admitting: Internal Medicine

## 2012-05-16 VITALS — BP 148/60 | HR 62 | Temp 98.6°F | Wt 134.0 lb

## 2012-05-16 DIAGNOSIS — D649 Anemia, unspecified: Secondary | ICD-10-CM | POA: Insufficient documentation

## 2012-05-16 DIAGNOSIS — M25429 Effusion, unspecified elbow: Secondary | ICD-10-CM | POA: Insufficient documentation

## 2012-05-16 DIAGNOSIS — M25422 Effusion, left elbow: Secondary | ICD-10-CM

## 2012-05-16 DIAGNOSIS — N183 Chronic kidney disease, stage 3 unspecified: Secondary | ICD-10-CM | POA: Insufficient documentation

## 2012-05-16 LAB — FERRITIN: Ferritin: 841 ng/mL — ABNORMAL HIGH (ref 22–322)

## 2012-05-16 LAB — POCT HEMOGLOBIN-HEMACUE: Hemoglobin: 11.6 g/dL — ABNORMAL LOW (ref 13.0–17.0)

## 2012-05-16 LAB — IRON AND TIBC
Iron: 92 ug/dL (ref 42–135)
Saturation Ratios: 39 % (ref 20–55)
TIBC: 238 ug/dL (ref 215–435)

## 2012-05-16 MED ORDER — EPOETIN ALFA 20000 UNIT/ML IJ SOLN
INTRAMUSCULAR | Status: AC
  Administered 2012-05-16: 20000 [IU] via SUBCUTANEOUS
  Filled 2012-05-16: qty 1

## 2012-05-16 MED ORDER — EPOETIN ALFA 20000 UNIT/ML IJ SOLN
20000.0000 [IU] | INTRAMUSCULAR | Status: DC
  Administered 2012-05-16: 20000 [IU] via SUBCUTANEOUS

## 2012-05-16 NOTE — Assessment & Plan Note (Signed)
Clearly no infection Discussed possible prolonged resolution time Can try compression Recommended against aspiration

## 2012-05-16 NOTE — Progress Notes (Signed)
Subjective:    Patient ID: Anthony Werner, male    DOB: June 14, 1924, 79 y.o.   MRN: [id number]  HPI Noticed a small lump on the left elbow a week ago or so Has gotten larger over time Doesn't remember any direct trauma---but does lean that elbow on a table in kitchen  No pain No drainage  Current Outpatient Prescriptions on File Prior to Visit  Medication Sig Dispense Refill  . amLODipine (NORVASC) 10 MG tablet Take 10 mg by mouth daily.      . Ascorbic Acid (VITAMIN C) 500 MG tablet Take 500 mg by mouth daily.        Marland Kitchen aspirin 81 MG tablet Take 81 mg by mouth daily.        Marland Kitchen b complex vitamins capsule Take 1 capsule by mouth daily.       . carvedilol (COREG) 12.5 MG tablet TAKE 1 TABLET (12.5 MG TOTAL) BY MOUTH 2 (TWO) TIMES DAILY WITH A MEAL.  180 tablet  2  . clopidogrel (PLAVIX) 75 MG tablet TAKE 1 TABLET (75 MG TOTAL) BY MOUTH DAILY.  90 tablet  2  . famciclovir (FAMVIR) 500 MG tablet Take 500 mg by mouth 2 (two) times daily. For 3-5 days as needed for cold sores      . furosemide (LASIX) 40 MG tablet TAKE 1 TABLET (40 MG TOTAL) BY MOUTH DAILY.  90 tablet  3  . isosorbide mononitrate (IMDUR) 30 MG 24 hr tablet TAKE 1 TABLET BY MOUTH EVERY MORNING.  30 tablet  8  . lisinopril (PRINIVIL,ZESTRIL) 10 MG tablet Take 1 tablet (10 mg total) by mouth 2 (two) times daily.  180 tablet  3  . omeprazole (PRILOSEC) 20 MG capsule TAKE 1 CAPSULE (20 MG TOTAL) BY MOUTH 2 (TWO) TIMES DAILY.  180 capsule  3  . pravastatin (PRAVACHOL) 40 MG tablet TAKE 1 TABLET (40 MG TOTAL) BY MOUTH DAILY.  90 tablet  3  . prednisoLONE acetate (PRED FORTE) 1 % ophthalmic suspension Place 1 drop into the right eye 2 (two) times daily.       Marland Kitchen VITAMIN D, CHOLECALCIFEROL, PO Take 1 capsule by mouth daily.        Current Facility-Administered Medications on File Prior to Visit  Medication Dose Route Frequency Provider Last Rate Last Dose  . epoetin alfa (EPOGEN,PROCRIT) injection 20,000 Units  20,000 Units Subcutaneous  Q21 days Dyke Maes, MD   20,000 Units at 05/16/12 0813  . ferumoxytol [facility]) injection 510 mg  510 mg Intravenous Once Dyke Maes, MD        Allergies  Allergen Reactions  . Atenolol   . Diltiazem   . Hydrochlorothiazide   . Labetalol   . Lovastatin   . Niacin   . Simvastatin   . Terazosin   . Iron     Just had constipation    Past Medical History  Diagnosis Date  . Hypertension   . Hyperlipidemia   . Anemia of chronic disease 04/2009    hgb 8...transfusion 04/2009  . CHF (congestive heart failure) 04/07/09    CHF while in hospital February, 2011, EF 45%  . Esophageal stricture     Dilatation and [facility], / dilatation later by Dr. Arlyce Dice  . CKD (chronic kidney disease), stage II   . Carotid artery disease     Total right carotid, 60-79% LICA Dr. Edilia Bo, July, 2011 / left carotid endarterectomy January, 2012     . Community acquired pneumonia   .  GERD (gastroesophageal reflux disease)   . Gout   . BPH (benign prostatic hyperplasia)   . Impaired fasting glucose   . Ejection fraction     45% by history  . Chest pain     Nuclear, April, 2011, no scar or ischemia    Past Surgical History  Procedure Laterality Date  . Cataract extraction    . Hernia repair    . Tonsillectomy    . Esophageal dilation      x3  . Carotid endarterectomy  LEFT    03/10/2010  . Iron injections  Sept. 2013    Every 3 weeks    Family History  Problem Relation Age of Onset  . Cancer Father     Prostate  . Hypertension Father   . Heart disease Father     Heart Disease before age 81  . Heart disease Brother     x2  . Hypertension Brother   . Hypertension Mother   . Hypertension Sister   . Hypertension Daughter     History   Social History  . Marital Status: Married    Spouse Name: N/A    Number of Children: 3  . Years of Education: N/A   Occupational History  . retired, Doctor, hospital   .     Social History Main Topics  . Smoking status: Former Smoker     Types: Cigarettes    Quit date: 02/15/1967  . Smokeless tobacco: Never Used  . Alcohol Use: No  . Drug Use: No  . Sexually Active: Not on file   Other Topics Concern  . 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 No distress.  Musculoskeletal:  Moderate enlargement of left olecranon bursa Soft and mobile No redness, erythema or tenderness          Assessment & Plan:

## 2012-05-30 ENCOUNTER — Other Ambulatory Visit: Payer: Self-pay

## 2012-05-30 MED ORDER — CLOPIDOGREL BISULFATE 75 MG PO TABS: ORAL_TABLET | ORAL | Status: DC

## 2012-06-06 ENCOUNTER — Encounter (HOSPITAL_COMMUNITY)
Admission: RE | Admit: 2012-06-06 | Discharge: 2012-06-06 | Disposition: A | Discharge status: Home / Self Care | Payer: Medicare Other | Source: Ambulatory Visit | Attending: Nephrology | Admitting: Nephrology

## 2012-06-06 MED ORDER — EPOETIN ALFA 20000 UNIT/ML IJ SOLN
20000.0000 [IU] | INTRAMUSCULAR | Status: DC
  Administered 2012-06-06: 20000 [IU] via SUBCUTANEOUS

## 2012-06-06 MED ORDER — EPOETIN ALFA 20000 UNIT/ML IJ SOLN
INTRAMUSCULAR | Status: AC
  Filled 2012-06-06: qty 1

## 2012-06-26 ENCOUNTER — Other Ambulatory Visit (HOSPITAL_COMMUNITY): Payer: Self-pay | Admitting: *Deleted

## 2012-06-27 ENCOUNTER — Encounter (HOSPITAL_COMMUNITY)
Admission: RE | Admit: 2012-06-27 | Discharge: 2012-06-27 | Disposition: A | Discharge status: Home / Self Care | Payer: Medicare Other | Source: Ambulatory Visit | Attending: Nephrology | Admitting: Nephrology

## 2012-06-27 DIAGNOSIS — N183 Chronic kidney disease, stage 3 unspecified: Secondary | ICD-10-CM | POA: Insufficient documentation

## 2012-06-27 DIAGNOSIS — D649 Anemia, unspecified: Secondary | ICD-10-CM | POA: Insufficient documentation

## 2012-06-27 MED ORDER — EPOETIN ALFA 20000 UNIT/ML IJ SOLN
20000.0000 [IU] | INTRAMUSCULAR | Status: DC
  Administered 2012-06-27: 20000 [IU] via SUBCUTANEOUS

## 2012-06-27 MED ORDER — EPOETIN ALFA 20000 UNIT/ML IJ SOLN
INTRAMUSCULAR | Status: AC
  Filled 2012-06-27: qty 1

## 2012-07-14 ENCOUNTER — Encounter: Payer: Self-pay | Admitting: Cardiology

## 2012-07-16 ENCOUNTER — Ambulatory Visit (INDEPENDENT_AMBULATORY_CARE_PROVIDER_SITE_OTHER): Payer: Medicare Other | Admitting: Cardiology

## 2012-07-16 ENCOUNTER — Encounter: Payer: Self-pay | Admitting: Cardiology

## 2012-07-16 VITALS — BP 123/59 | HR 62 | Ht 67.0 in | Wt 138.0 lb

## 2012-07-16 DIAGNOSIS — I779 Disorder of arteries and arterioles, unspecified: Secondary | ICD-10-CM

## 2012-07-16 DIAGNOSIS — E785 Hyperlipidemia, unspecified: Secondary | ICD-10-CM

## 2012-07-16 NOTE — Assessment & Plan Note (Signed)
He is not having any chest pain. His nuclear scan in October, 2012 revealed no definite ischemia and there was an EF of 60%. I have carefully reviewed the fact that he is on aspirin and Plavix. He was on this when I first met him. He has tolerated these medicines well at 79 years of age. I have decided that is most prudent to continue both. If he has any bleeding difficulties, his Plavix should be stopped.

## 2012-07-16 NOTE — Patient Instructions (Addendum)
  Your physician wants you to follow-up in: 12 months You will receive a reminder letter in the mail two months in advance. If you don't receive a letter, please call our office to schedule the follow-up appointment.    Continue same medications

## 2012-07-16 NOTE — Assessment & Plan Note (Signed)
His carotid disease is followed very carefully by vascular surgery. He is stable.

## 2012-07-16 NOTE — Assessment & Plan Note (Signed)
The patient is on Pravachol. I feel it is not prudent to make any changes based on new guidelines. The guidelines are not clear above age 79. I feel would be a mistake to change a medicine that he tolerates extremely well.

## 2012-07-16 NOTE — Progress Notes (Signed)
HPI  Oh coronary artery disease. I saw him last in May, 2013. He has been stable. He has known significant vascular disease. There is history of total occlusion of the right internal carotid artery. He had a left carotid endarterectomy several years ago and he's done very well. He has followed up with vascular surgery. He's not having any chest pain or shortness of breath.  Allergies  Allergen Reactions  . Atenolol   . Diltiazem   . Hydrochlorothiazide   . Labetalol   . Lovastatin   . Niacin   . Simvastatin   . Terazosin   . Iron     Just had constipation    Current Outpatient Prescriptions  Medication Sig Dispense Refill  . amLODipine (NORVASC) 10 MG tablet Take 10 mg by mouth daily.      Marland Kitchen aspirin 81 MG tablet Take 81 mg by mouth daily.        Marland Kitchen b complex vitamins capsule Take 1 capsule by mouth daily.       . carvedilol (COREG) 12.5 MG tablet TAKE 1 TABLET (12.5 MG TOTAL) BY MOUTH 2 (TWO) TIMES DAILY WITH A MEAL.  180 tablet  2  . clopidogrel (PLAVIX) 75 MG tablet TAKE 1 TABLET (75 MG TOTAL) BY MOUTH DAILY.  90 tablet  1  . famciclovir (FAMVIR) 500 MG tablet Take 500 mg by mouth 2 (two) times daily.       . furosemide (LASIX) 40 MG tablet TAKE 1 TABLET (40 MG TOTAL) BY MOUTH DAILY.  90 tablet  3  . isosorbide mononitrate (IMDUR) 30 MG 24 hr tablet TAKE 1 TABLET BY MOUTH EVERY MORNING.  30 tablet  8  . lisinopril (PRINIVIL,ZESTRIL) 10 MG tablet Take 20 mg by mouth daily.      Marland Kitchen omeprazole (PRILOSEC) 20 MG capsule TAKE 1 CAPSULE (20 MG TOTAL) BY MOUTH 2 (TWO) TIMES DAILY.  180 capsule  3  . pravastatin (PRAVACHOL) 40 MG tablet TAKE 1 TABLET (40 MG TOTAL) BY MOUTH DAILY.  90 tablet  3  . prednisoLONE acetate (PRED FORTE) 1 % ophthalmic suspension Place 1 drop into the right eye 2 (two) times daily.       Marland Kitchen VITAMIN D, CHOLECALCIFEROL, PO Take 1 capsule by mouth daily.        No current facility-administered medications for this visit.   Facility-Administered Medications Ordered  in Other Visits  Medication Dose Route Frequency Provider Last Rate Last Dose  . ferumoxytol [facility]) injection 510 mg  510 mg Intravenous Once Dyke Maes, MD        History   Social History  . Marital Status: Married    Spouse Name: N/A    Number of Children: 3  . Years of Education: N/A   Occupational History  . retired, Doctor, hospital   .     Social History Main Topics  . Smoking status: Former Smoker    Types: Cigarettes    Quit date: 02/15/1967  . Smokeless tobacco: Never Used  . Alcohol Use: No  . Drug Use: No  . Sexually Active: Not on file   Other Topics Concern  . 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 Cancer Father  Prostate  . Hypertension Father   . Heart disease Father     Heart Disease before age 52  . Heart disease Brother     x2  . Hypertension Brother   . Hypertension Mother   . Hypertension Sister   . Hypertension Daughter     Past Medical History  Diagnosis Date  . Hypertension   . Hyperlipidemia   . Anemia of chronic disease 04/2009    hgb 8...transfusion 04/2009  . CHF (congestive heart failure) 04/07/09    CHF while in hospital February, 2011, EF 45%  . Esophageal stricture     Dilatation and [facility], / dilatation later by Dr. Arlyce Dice  . CKD (chronic kidney disease), stage II   . Carotid artery disease     Total right carotid, 60-79% LICA Dr. Edilia Bo, July, 2011 / left carotid endarterectomy January, 2012     . Community acquired pneumonia   . GERD (gastroesophageal reflux disease)   . Gout   . BPH (benign prostatic hyperplasia)   . Impaired fasting glucose   . Ejection fraction     45% by history  . Chest pain     Nuclear, April, 2011, no scar or ischemia    Past  Surgical History  Procedure Laterality Date  . Cataract extraction    . Hernia repair    . Tonsillectomy    . Esophageal dilation      x3  . Carotid endarterectomy  LEFT    03/10/2010  . Iron injections  Sept. 2013    Every 3 weeks    Patient Active Problem List   Diagnosis Date Noted  . Carotid artery disease     Priority: High  . GERD (gastroesophageal reflux disease)     Priority: High  . Ejection fraction     Priority: High  . Chest pain     Priority: High  . Effusion of olecranon bursa 05/16/2012  . Occlusion and stenosis of carotid artery without mention of cerebral infarction 10/19/2011  . 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 Hypertension   . Hyperlipidemia   . GOUT 07/15/2009  . BENIGN PROSTATIC HYPERTROPHY 07/15/2009  . IMPAIRED FASTING GLUCOSE 07/15/2009  . CHRONIC KIDNEY DISEASE STAGE III (MODERATE) 04/30/2009  . ESOPHAGEAL STRICTURE 04/15/2009  . Anemia of chronic disease 04/14/2009    ROS   Patient denies fever, chills, headache, sweats, rash, change in vision, change in hearing, chest pain, cough, nausea or vomiting, urinary symptoms. All other systems are reviewed and are negative.  PHYSICAL EXAM  Patient is oriented to person time and place. Affect is normal. There is no jugulovenous distention. Lungs are clear. Respiratory effort is nonlabored. He is here with his wife. Cardiac exam reveals S1 and S2. His heart sounds are distant. There is a very soft systolic murmur. The abdomen is soft. There is no peripheral edema.  Filed Vitals:   07/16/12 0922  BP: 123/59  Pulse: 62  Height: 5\' 7"  (1.702 m)  Weight: 138 lb (62.596 kg)  SpO2: 95%   EKG is done today and reviewed by me. There is very mild sinus bradycardia. There are no other significant abnormalities.  ASSESSMENT & PLAN

## 2012-07-18 ENCOUNTER — Encounter (HOSPITAL_COMMUNITY)
Admission: RE | Admit: 2012-07-18 | Discharge: 2012-07-18 | Disposition: A | Discharge status: Home / Self Care | Payer: Medicare Other | Source: Ambulatory Visit | Attending: Nephrology | Admitting: Nephrology

## 2012-07-18 DIAGNOSIS — D649 Anemia, unspecified: Secondary | ICD-10-CM | POA: Insufficient documentation

## 2012-07-18 DIAGNOSIS — N183 Chronic kidney disease, stage 3 unspecified: Secondary | ICD-10-CM | POA: Insufficient documentation

## 2012-07-18 MED ORDER — EPOETIN ALFA 20000 UNIT/ML IJ SOLN
INTRAMUSCULAR | Status: AC
  Filled 2012-07-18: qty 1

## 2012-07-18 MED ORDER — EPOETIN ALFA 20000 UNIT/ML IJ SOLN
20000.0000 [IU] | INTRAMUSCULAR | Status: DC
  Administered 2012-07-18: 20000 [IU] via SUBCUTANEOUS

## 2012-07-23 ENCOUNTER — Other Ambulatory Visit: Payer: Self-pay

## 2012-07-23 MED ORDER — ISOSORBIDE MONONITRATE ER 30 MG PO TB24: 30.0000 mg | ORAL_TABLET | Freq: Every day | ORAL | Status: DC

## 2012-08-08 ENCOUNTER — Encounter (HOSPITAL_COMMUNITY): Payer: Medicare Other

## 2012-08-10 ENCOUNTER — Encounter (HOSPITAL_COMMUNITY)
Admission: RE | Admit: 2012-08-10 | Discharge: 2012-08-10 | Disposition: A | Discharge status: Home / Self Care | Payer: Medicare Other | Source: Ambulatory Visit | Attending: Nephrology | Admitting: Nephrology

## 2012-08-10 LAB — POCT HEMOGLOBIN-HEMACUE: Hemoglobin: 11.4 g/dL — ABNORMAL LOW (ref 13.0–17.0)

## 2012-08-10 MED ORDER — EPOETIN ALFA 20000 UNIT/ML IJ SOLN
INTRAMUSCULAR | Status: AC
  Administered 2012-08-10: 20000 [IU] via SUBCUTANEOUS
  Filled 2012-08-10: qty 1

## 2012-08-10 MED ORDER — EPOETIN ALFA 20000 UNIT/ML IJ SOLN: 20000.0000 [IU] | INTRAMUSCULAR | Status: DC

## 2012-08-15 ENCOUNTER — Encounter: Payer: Self-pay | Admitting: Nephrology

## 2012-08-26 ENCOUNTER — Other Ambulatory Visit: Payer: Self-pay | Admitting: Internal Medicine

## 2012-08-30 ENCOUNTER — Other Ambulatory Visit (HOSPITAL_COMMUNITY): Payer: Self-pay

## 2012-08-31 ENCOUNTER — Encounter (HOSPITAL_COMMUNITY)
Admission: RE | Admit: 2012-08-31 | Discharge: 2012-08-31 | Disposition: A | Discharge status: Home / Self Care | Payer: Medicare Other | Source: Ambulatory Visit | Attending: Nephrology | Admitting: Nephrology

## 2012-08-31 DIAGNOSIS — N183 Chronic kidney disease, stage 3 unspecified: Secondary | ICD-10-CM | POA: Insufficient documentation

## 2012-08-31 DIAGNOSIS — D649 Anemia, unspecified: Secondary | ICD-10-CM | POA: Insufficient documentation

## 2012-08-31 LAB — POCT HEMOGLOBIN-HEMACUE: Hemoglobin: 10.8 g/dL — ABNORMAL LOW (ref 13.0–17.0)

## 2012-08-31 MED ORDER — EPOETIN ALFA 20000 UNIT/ML IJ SOLN
INTRAMUSCULAR | Status: AC
  Administered 2012-08-31: 20000 [IU] via SUBCUTANEOUS
  Filled 2012-08-31: qty 1

## 2012-08-31 MED ORDER — EPOETIN ALFA 20000 UNIT/ML IJ SOLN: 20000.0000 [IU] | INTRAMUSCULAR | Status: DC

## 2012-09-04 ENCOUNTER — Other Ambulatory Visit: Payer: Self-pay | Admitting: Cardiology

## 2012-09-20 ENCOUNTER — Other Ambulatory Visit: Payer: Self-pay | Admitting: Internal Medicine

## 2012-09-20 ENCOUNTER — Other Ambulatory Visit (HOSPITAL_COMMUNITY): Payer: Self-pay | Admitting: *Deleted

## 2012-09-21 ENCOUNTER — Encounter (HOSPITAL_COMMUNITY)
Admission: RE | Admit: 2012-09-21 | Discharge: 2012-09-21 | Disposition: A | Discharge status: Home / Self Care | Payer: Medicare Other | Source: Ambulatory Visit | Attending: Nephrology | Admitting: Nephrology

## 2012-09-21 DIAGNOSIS — D649 Anemia, unspecified: Secondary | ICD-10-CM | POA: Insufficient documentation

## 2012-09-21 DIAGNOSIS — N183 Chronic kidney disease, stage 3 unspecified: Secondary | ICD-10-CM | POA: Insufficient documentation

## 2012-09-21 LAB — POCT HEMOGLOBIN-HEMACUE: Hemoglobin: 10.7 g/dL — ABNORMAL LOW (ref 13.0–17.0)

## 2012-09-21 MED ORDER — EPOETIN ALFA 20000 UNIT/ML IJ SOLN: 20000.0000 [IU] | INTRAMUSCULAR | Status: DC

## 2012-09-21 MED ORDER — EPOETIN ALFA 20000 UNIT/ML IJ SOLN
INTRAMUSCULAR | Status: AC
  Administered 2012-09-21: 20000 [IU] via SUBCUTANEOUS
  Filled 2012-09-21: qty 1

## 2012-09-21 MED ORDER — SODIUM CHLORIDE 0.9 % IV SOLN
1020.0000 mg | Freq: Once | INTRAVENOUS | Status: AC
  Administered 2012-09-21: 1020 mg via INTRAVENOUS
  Filled 2012-09-21: qty 34

## 2012-09-24 ENCOUNTER — Other Ambulatory Visit: Payer: Self-pay | Admitting: Internal Medicine

## 2012-10-12 ENCOUNTER — Encounter (HOSPITAL_COMMUNITY)
Admission: RE | Admit: 2012-10-12 | Discharge: 2012-10-12 | Disposition: A | Discharge status: Home / Self Care | Payer: Medicare Other | Source: Ambulatory Visit | Attending: Nephrology | Admitting: Nephrology

## 2012-10-12 MED ORDER — EPOETIN ALFA 20000 UNIT/ML IJ SOLN: 20000.0000 [IU] | INTRAMUSCULAR | Status: DC

## 2012-10-12 MED ORDER — EPOETIN ALFA 20000 UNIT/ML IJ SOLN
INTRAMUSCULAR | Status: AC
  Administered 2012-10-12: 20000 [IU] via SUBCUTANEOUS
  Filled 2012-10-12: qty 1

## 2012-10-21 ENCOUNTER — Other Ambulatory Visit: Payer: Self-pay | Admitting: Cardiology

## 2012-10-22 ENCOUNTER — Encounter: Payer: Self-pay | Admitting: Internal Medicine

## 2012-10-22 ENCOUNTER — Ambulatory Visit (INDEPENDENT_AMBULATORY_CARE_PROVIDER_SITE_OTHER): Payer: Medicare Other | Admitting: Internal Medicine

## 2012-10-22 VITALS — BP 138/68 | HR 60 | Temp 98.3°F | Wt 136.0 lb

## 2012-10-22 DIAGNOSIS — R079 Chest pain, unspecified: Secondary | ICD-10-CM

## 2012-10-22 DIAGNOSIS — E785 Hyperlipidemia, unspecified: Secondary | ICD-10-CM

## 2012-10-22 DIAGNOSIS — R7301 Impaired fasting glucose: Secondary | ICD-10-CM

## 2012-10-22 DIAGNOSIS — I10 Essential (primary) hypertension: Secondary | ICD-10-CM

## 2012-10-22 DIAGNOSIS — J301 Allergic rhinitis due to pollen: Secondary | ICD-10-CM | POA: Insufficient documentation

## 2012-10-22 DIAGNOSIS — N400 Enlarged prostate without lower urinary tract symptoms: Secondary | ICD-10-CM

## 2012-10-22 LAB — BASIC METABOLIC PANEL
BUN: 34 mg/dL — ABNORMAL HIGH (ref 6–23)
CO2: 28 mEq/L (ref 19–32)
Chloride: 102 mEq/L (ref 96–112)
Creatinine, Ser: 1.5 mg/dL (ref 0.4–1.5)
Potassium: 4.4 mEq/L (ref 3.5–5.1)

## 2012-10-22 LAB — HEPATIC FUNCTION PANEL
ALT: 15 U/L (ref 0–53)
AST: 17 U/L (ref 0–37)
Albumin: 3.9 g/dL (ref 3.5–5.2)
Alkaline Phosphatase: 158 U/L — ABNORMAL HIGH (ref 39–117)
Total Bilirubin: 0.7 mg/dL (ref 0.3–1.2)

## 2012-10-22 LAB — LIPID PANEL
Cholesterol: 141 mg/dL (ref 0–200)
Triglycerides: 84 mg/dL (ref 0.0–149.0)

## 2012-10-22 LAB — HEMOGLOBIN A1C: Hgb A1c MFr Bld: 5.9 % (ref 4.6–6.5)

## 2012-10-22 NOTE — Progress Notes (Signed)
Subjective:    Patient ID: Anthony Werner, male    DOB: Dec 12, 1924, 79 y.o.   MRN: [id number]  HPI Here with wife  Doing okay Some runny nose Cough with some thick mucus Not sick---thinks it may be ragweed  No chest pain unless he eats late---then gets acid symptoms Still on the omeprazole Discussed avoiding eating close to bedtime  Gets some DOE when working in the yard--- stable May feel his heart go fast with activity No dizziness or syncope No edema  Voids okay Nocturia 2-3 per night---stable No daytime urgency or increased frequency  Current Outpatient Prescriptions on File Prior to Visit  Medication Sig Dispense Refill  . amLODipine (NORVASC) 10 MG tablet Take 10 mg by mouth daily.      Marland Kitchen aspirin 81 MG tablet Take 81 mg by mouth daily.        Marland Kitchen b complex vitamins capsule Take 1 capsule by mouth daily.       . carvedilol (COREG) 12.5 MG tablet TAKE 1 TABLET (12.5 MG TOTAL) BY MOUTH 2 (TWO) TIMES DAILY WITH A MEAL.  180 tablet  2  . clopidogrel (PLAVIX) 75 MG tablet TAKE 1 TABLET (75 MG TOTAL) BY MOUTH DAILY.  90 tablet  1  . famciclovir (FAMVIR) 500 MG tablet Take 500 mg by mouth 2 (two) times daily.       . furosemide (LASIX) 40 MG tablet TAKE 1 TABLET (40 MG TOTAL) BY MOUTH DAILY.  90 tablet  0  . isosorbide mononitrate (IMDUR) 30 MG 24 hr tablet Take 1 tablet (30 mg total) by mouth daily.  30 tablet  3  . lisinopril (PRINIVIL,ZESTRIL) 10 MG tablet Take 20 mg by mouth daily.      Marland Kitchen omeprazole (PRILOSEC) 20 MG capsule TAKE 1 CAPSULE (20 MG TOTAL) BY MOUTH 2 (TWO) TIMES DAILY.  180 capsule  3  . pravastatin (PRAVACHOL) 40 MG tablet TAKE 1 TABLET (40 MG TOTAL) BY MOUTH DAILY.  90 tablet  3  . prednisoLONE acetate (PRED FORTE) 1 % ophthalmic suspension Place 1 drop into the right eye 2 (two) times daily.       Marland Kitchen VITAMIN D, CHOLECALCIFEROL, PO Take 1 capsule by mouth daily.        Current Facility-Administered Medications on File Prior to Visit  Medication Dose Route  Frequency Provider Last Rate Last Dose  . ferumoxytol [facility]) injection 510 mg  510 mg Intravenous Once Dyke Maes, MD        Allergies  Allergen Reactions  . Atenolol   . Diltiazem   . Hydrochlorothiazide   . Labetalol   . Lovastatin   . Niacin   . Simvastatin   . Terazosin   . Iron     Just had constipation    Past Medical History  Diagnosis Date  . Hypertension   . Hyperlipidemia   . Anemia of chronic disease 04/2009    hgb 8...transfusion 04/2009  . CHF (congestive heart failure) 04/07/09    CHF while in hospital February, 2011, EF 45%  . Esophageal stricture     Dilatation and [facility], / dilatation later by Dr. Arlyce Dice  . CKD (chronic kidney disease), stage II   . Carotid artery disease     Total right carotid, 60-79% LICA Dr. Edilia Bo, July, 2011 / left carotid endarterectomy January, 2012     . Community acquired pneumonia   . GERD (gastroesophageal reflux disease)   . Gout   . BPH (benign prostatic hyperplasia)   .  Impaired fasting glucose   . Ejection fraction     45% by history  . Chest pain     Nuclear, April, 2011, no scar or ischemia    Past Surgical History  Procedure Laterality Date  . Cataract extraction    . Hernia repair    . Tonsillectomy    . Esophageal dilation      x3  . Carotid endarterectomy  LEFT    03/10/2010  . Iron injections  Sept. 2013    Every 3 weeks    Family History  Problem Relation Age of Onset  . Cancer Father     Prostate  . Hypertension Father   . Heart disease Father     Heart Disease before age 22  . Heart disease Brother     x2  . Hypertension Brother   . Hypertension Mother   . Hypertension Sister   . Hypertension Daughter     History   Social History  . Marital Status: Married    Spouse Name: N/A    Number of Children: 3  . Years of Education: N/A   Occupational History  . retired, Doctor, hospital   .     Social History Main Topics  . Smoking status: Former Smoker    Types: Cigarettes     Quit date: 02/15/1967  . Smokeless tobacco: Never Used  . Alcohol Use: No  . Drug Use: No  . Sexual Activity: Not on file   Other Topics Concern  . 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 No distress.  Neck: Normal range of motion. Neck supple. No thyromegaly present.  Carotid bruits---louder on left  Cardiovascular: Normal rate and regular rhythm.  Exam reveals no gallop.   Murmur heard. Soft systolic murmur  Pulmonary/Chest: Effort normal and breath sounds normal. No respiratory distress. He has no wheezes. He has no rales.  Abdominal: Soft. There is no tenderness.  Musculoskeletal: He exhibits no edema and no tenderness.  Lymphadenopathy:    He has no cervical adenopathy.  Psychiatric: He has a normal mood and affect. His behavior is normal.          Assessment & Plan:

## 2012-10-22 NOTE — Assessment & Plan Note (Signed)
Reviewed antihistamines to try

## 2012-10-22 NOTE — Assessment & Plan Note (Signed)
No problems with the statin 

## 2012-10-22 NOTE — Assessment & Plan Note (Signed)
Voiding okay 

## 2012-10-22 NOTE — Patient Instructions (Addendum)
Please try cetirizine 10mg  or loratadine 10mg  (1 or 2) daily for the runny nose and cough. Please make sure you take the omeprazole on an empty stomach.

## 2012-10-22 NOTE — Assessment & Plan Note (Signed)
Sounds like acid related Discussed not eating close to bedtime Make sure omeprazole is on an empty stomach

## 2012-10-22 NOTE — Assessment & Plan Note (Signed)
BP Readings from Last 3 Encounters:  10/22/12 138/68  10/12/12 127/60  09/21/12 128/49   Good control Due for labs

## 2012-10-22 NOTE — Assessment & Plan Note (Signed)
Follows with Dr Briant Cedar

## 2012-10-23 ENCOUNTER — Encounter: Payer: Self-pay | Admitting: Family

## 2012-10-24 ENCOUNTER — Ambulatory Visit (INDEPENDENT_AMBULATORY_CARE_PROVIDER_SITE_OTHER): Payer: Medicare Other | Admitting: Family

## 2012-10-24 ENCOUNTER — Encounter: Payer: Self-pay | Admitting: Family Medicine

## 2012-10-24 ENCOUNTER — Encounter: Payer: Self-pay | Admitting: Family

## 2012-10-24 ENCOUNTER — Other Ambulatory Visit (INDEPENDENT_AMBULATORY_CARE_PROVIDER_SITE_OTHER): Payer: Medicare Other | Admitting: *Deleted

## 2012-10-24 DIAGNOSIS — Z48812 Encounter for surgical aftercare following surgery on the circulatory system: Secondary | ICD-10-CM

## 2012-10-24 DIAGNOSIS — I6529 Occlusion and stenosis of unspecified carotid artery: Secondary | ICD-10-CM

## 2012-10-24 NOTE — Patient Instructions (Addendum)
Stroke Prevention Some medical conditions and behaviors are associated with an increased chance of having a stroke. You may prevent a stroke by making healthy choices and managing medical conditions. Reduce your risk of having a stroke by:  Staying physically active. Get at least 30 minutes of activity on most or all days.  Not smoking. It may also be helpful to avoid exposure to secondhand smoke.  Limiting alcohol use. Moderate alcohol use is considered to be:  No more than 2 drinks per day for men.  No more than 1 drink per day for nonpregnant women.  Eating healthy foods.  Include 5 or more servings of fruits and vegetables a day.  Certain diets may be prescribed to address high blood pressure, high cholesterol, diabetes, or obesity.  Managing your cholesterol levels.  A low-saturated fat, low-trans fat, low-cholesterol, and high-fiber diet may control cholesterol levels.  Take any prescribed medicines to control cholesterol as directed by your caregiver.  Managing your diabetes.  A controlled-carbohydrate, controlled-sugar diet is recommended to manage diabetes.  Take any prescribed medicines to control diabetes as directed by your caregiver.  Controlling your high blood pressure (hypertension).  A low-salt (sodium), low-saturated fat, low-trans fat, and low-cholesterol diet is recommended to manage high blood pressure.  Take any prescribed medicines to control hypertension as directed by your caregiver.  Maintaining a healthy weight.  A reduced-calorie, low-sodium, low-saturated fat, low-trans fat, low-cholesterol diet is recommended to manage weight.  Stopping drug abuse.  Avoiding birth control pills.  Talk to your caregiver about the risks of taking birth control pills if you are over 35 years old, smoke, get migraines, or have ever had a blood clot.  Getting evaluated for sleep disorders (sleep apnea).  Talk to your caregiver about getting a sleep evaluation  if you snore a lot or have excessive sleepiness.  Taking medicines as directed by your caregiver.  For some people, aspirin or blood thinners (anticoagulants) are helpful in reducing the risk of forming abnormal blood clots that can lead to stroke. If you have the irregular heart rhythm of atrial fibrillation, you should be on a blood thinner unless there is a good reason you cannot take them.  Understand all your medicine instructions. SEEK IMMEDIATE MEDICAL CARE IF:   You have sudden weakness or numbness of the face, arm, or leg, especially on one side of the body.  You have sudden confusion.  You have trouble speaking (aphasia) or understanding.  You have sudden trouble seeing in one or both eyes.  You have sudden trouble walking.  You have dizziness.  You have a loss of balance or coordination.  You have a sudden, severe headache with no known cause.  You have new chest pain or an irregular heartbeat. Any of these symptoms may represent a serious problem that is an emergency. Do not wait to see if the symptoms will go away. Get medical help right away. Call your local emergency services (911 in [place] Do not drive yourself to the hospital. Document Released: 03/10/2004 Document Revised: 04/25/2011 Document Reviewed: 09/20/2010 [employer] Patient Information 2014 [employer].  

## 2012-10-24 NOTE — Progress Notes (Signed)
Established Carotid Patient  History of Present Illness  Anthony Werner is a 79 y.o. male who had a left carotid endarterectomy in 2012 by Dr. Edilia Bo. Carotid Duplex a year ago showed no evidence of significant recurrent carotid stenosis on the left side. He has a known right internal carotid artery occlusion. He returns today for Duplex carotid artery surveillance. Patient denies ever having stroke or TIA's before or after his CEA. He denies claudication symptoms.  Patient has Negative history of TIA or stroke symptom.  The patient denies amaurosis fugax or monocular blindness.  The patient  denies facial drooping.  Pt. denies hemiplegia.  The patient denies receptive or expressive aphasia.  Pt. denies extremity weakness.  The patient's previous neurologic deficits are Unchanged.  Patient denies New Medical or Surgical History. PMHx is significant for CHF, resolved 3 years ago, he denies history of MI.  Pt Diabetic: No Pt smoker: former smoker, quit 1979, smoked for 20 years  Pt meds include: Statin : Yes Betablocker: Yes ASA: Yes Other anticoagulants/antiplatelets: Plavix   Past Medical History  Diagnosis Date  . Hypertension   . Hyperlipidemia   . Anemia of chronic disease 04/2009    hgb 8...transfusion 04/2009  . CHF (congestive heart failure) 04/07/09    CHF while in hospital February, 2011, EF 45%  . Esophageal stricture     Dilatation and VA, / dilatation later by Dr. Arlyce Dice  . CKD (chronic kidney disease), stage II   . Carotid artery disease     Total right carotid, 60-79% LICA Dr. Edilia Bo, July, 2011 / left carotid endarterectomy January, 2012     . Community acquired pneumonia   . GERD (gastroesophageal reflux disease)   . Gout   . BPH (benign prostatic hyperplasia)   . Impaired fasting glucose   . Ejection fraction     45% by history  . Chest pain     Nuclear, April, 2011, no scar or ischemia    Social History History  Substance Use Topics  . Smoking  status: Former Smoker    Types: Cigarettes    Quit date: 02/15/1967  . Smokeless tobacco: Never Used  . Alcohol Use: No    Family History Family History  Problem Relation Age of Onset  . Cancer Father     Prostate  . Hypertension Father   . Heart disease Father     Heart Disease before age 83  . Heart disease Brother     x2  . Hypertension Brother   . Hypertension Mother   . Hypertension Sister   . Hypertension Daughter     Surgical History Past Surgical History  Procedure Laterality Date  . Cataract extraction    . Hernia repair    . Tonsillectomy    . Esophageal dilation      x3  . Carotid endarterectomy  LEFT    03/10/2010  . Iron injections  Sept. 2013    Every 3 weeks    Allergies  Allergen Reactions  . Atenolol   . Diltiazem   . Hydrochlorothiazide   . Labetalol   . Lovastatin   . Niacin   . Simvastatin   . Terazosin   . Iron     Just had constipation    Current Outpatient Prescriptions  Medication Sig Dispense Refill  . amLODipine (NORVASC) 10 MG tablet Take 10 mg by mouth daily.      Marland Kitchen aspirin 81 MG tablet Take 81 mg by mouth daily.        Marland Kitchen  b complex vitamins capsule Take 1 capsule by mouth daily.       . carvedilol (COREG) 12.5 MG tablet TAKE 1 TABLET (12.5 MG TOTAL) BY MOUTH 2 (TWO) TIMES DAILY WITH A MEAL.  180 tablet  2  . clopidogrel (PLAVIX) 75 MG tablet TAKE 1 TABLET (75 MG TOTAL) BY MOUTH DAILY.  90 tablet  1  . famciclovir (FAMVIR) 500 MG tablet Take 500 mg by mouth 2 (two) times daily.       . furosemide (LASIX) 40 MG tablet TAKE 1 TABLET (40 MG TOTAL) BY MOUTH DAILY.  90 tablet  0  . isosorbide mononitrate (IMDUR) 30 MG 24 hr tablet Take 1 tablet (30 mg total) by mouth daily.  30 tablet  3  . lisinopril (PRINIVIL,ZESTRIL) 10 MG tablet Take 20 mg by mouth daily.      Marland Kitchen lisinopril (PRINIVIL,ZESTRIL) 20 MG tablet TAKE 1 TABLET BY MOUTH EVERY DAY  90 tablet  3  . omeprazole (PRILOSEC) 20 MG capsule TAKE 1 CAPSULE (20 MG TOTAL) BY MOUTH 2  (TWO) TIMES DAILY.  180 capsule  3  . pravastatin (PRAVACHOL) 40 MG tablet TAKE 1 TABLET (40 MG TOTAL) BY MOUTH DAILY.  90 tablet  3  . prednisoLONE acetate (PRED FORTE) 1 % ophthalmic suspension Place 1 drop into the right eye 2 (two) times daily.       Marland Kitchen VITAMIN D, CHOLECALCIFEROL, PO Take 1 capsule by mouth daily.        No current facility-administered medications for this visit.   Facility-Administered Medications Ordered in Other Visits  Medication Dose Route Frequency Provider Last Rate Last Dose  . ferumoxytol [facility]) injection 510 mg  510 mg Intravenous Once Dyke Maes, MD        Review of Systems : [x]  Positive   [ ]  Denies  General:[ ]  Weight loss,  [ ]  Weight gain, [ ]  Loss of appetite, [ ]  Fever, [ ]  chills  Neurologic: [ ]  Dizziness, [ ]  Blackouts, [ ]  Headaches, [ ]  Seizure [ ]  Stroke, [ ]  "Mini stroke", [ ]  Slurred speech, [ ]  Temporary blindness;  [ ] weakness,  Ear/Nose/Throat: [ ]  Change in hearing, [ ]  Nose bleeds, [ ]  Hoarseness  Vascular:[ ]  Pain in legs with walking, [ ]  Pain in feet while lying flat , [ ]   Non-healing ulcer, [ ]  Blood clot in vein,    Pulmonary: [ ]  Home oxygen, [ ]   Productive cough, [ ]  Bronchitis, [ ]  Coughing up blood,  [ ]  Asthma, [ ]  Wheezing  Musculoskeletal:  Arly.Keller ] Arthritis, [ ]  Joint pain, Arly.Keller ] low back pain  Cardiac: [ ]  Chest pain, [ ]  Shortness of breath when lying flat, Arly.Keller ] Shortness of breath with exertion, [ ]  Palpitations, [ ]  Heart murmur, [ ]   Atrial fibrillation  Hematologic:[ ]  Easy Bruising, [ ]  Anemia; [ ]  Hepatitis  Psychiatric: [ ]   Depression, [ ]  Anxiety   Gastrointestinal: [ ]  Black stool, [ ]  Blood in stool, [ ]  Peptic ulcer disease,  [ ]  Gastroesophageal Reflux, [ ]  Trouble swallowing, [ ]  Diarrhea, [ ]  Constipation  Urinary: Arly.Keller ] chronic Kidney disease, Sees Dr. Briant Cedar , [ ]  on HD, [ ]  Burning with urination, [ ]  Frequent urination, [ ]  Difficulty urinating;   Skin: [ ]  Rashes, [ ]  Wounds     Physical Examination  Filed Vitals:   10/24/12 1345  BP: 113/59  Pulse: 58  Resp:    Filed  Weights   10/24/12 1343  Weight: 136 lb (61.689 kg)   Body mass index is 21.3 kg/(m^2).  General: WDWN male in NAD GAIT: normal Eyes: PERRLA Pulmonary:  CTAB, Negative  Rales, Negative rhonchi, & Negative wheezing.  Cardiac: regular Rhythm ,  Negative Murmurs.  VASCULAR EXAM Carotid Bruits Left Right   Negative Negative                                                                                                                                LE Pulses LEFT RIGHT       FEMORAL   palpable   palpable        POPLITEAL  not palpable   not palpable       POSTERIOR TIBIAL  not palpable    not palpable        DORSALIS PEDIS      ANTERIOR TIBIAL  palpable   palpable       Gastrointestinal: soft, nontender, BS WNL, no r/g,  negative masses.  Musculoskeletal: Negative muscle atrophy/wasting. M/S 5/5 throughout, Extremities without ischemic changes.  Neurologic: A&O X 3; Appropriate Affect ; SENSATION ;normal; Speech is normal CN 2-12 intact, Pain and light touch intact in extremities, Motor exam as listed above.   Non-Invasive Vascular Imaging CAROTID DUPLEX 10/24/2012   Known right carotid artery occlusion. Widely patent left carotid endarterectomy site without evidence of hyperplasia or restenosis.  These findings are Unchanged from previous exam on 10/19/11.  Assessment: AXTEN PASCUCCI is a 79 y.o. male who presents with asymptomatic occluded right ICA and widely patent left ICA CEA site, no significant change in comparison to Duplex on 10/19/11.  Plan: Follow-up in 1 year with Carotid Duplex scan.  I discussed in depth with the patient the nature of atherosclerosis, and emphasized the importance of maximal medical management including strict control of blood pressure, blood glucose, and lipid levels, obtaining regular exercise, and continued cessation of smoking.   The patient is aware that without maximal medical management the underlying atherosclerotic disease process will progress, limiting the benefit of any interventions. Pt was given information regarding stroke symptoms and prevention. Thank you for allowing [place] to participate in this patient's care.  Charisse March, RN, MSN, FNP-C [facility] Office: [phone]  Clinic Physician: Edilia Bo  10/24/2012 1:13 PM

## 2012-11-02 ENCOUNTER — Encounter (HOSPITAL_COMMUNITY)
Admission: RE | Admit: 2012-11-02 | Discharge: 2012-11-02 | Disposition: A | Discharge status: Home / Self Care | Payer: Medicare Other | Source: Ambulatory Visit | Attending: Nephrology | Admitting: Nephrology

## 2012-11-02 DIAGNOSIS — D649 Anemia, unspecified: Secondary | ICD-10-CM | POA: Insufficient documentation

## 2012-11-02 DIAGNOSIS — N183 Chronic kidney disease, stage 3 unspecified: Secondary | ICD-10-CM | POA: Insufficient documentation

## 2012-11-02 LAB — POCT HEMOGLOBIN-HEMACUE: Hemoglobin: 11.5 g/dL — ABNORMAL LOW (ref 13.0–17.0)

## 2012-11-02 MED ORDER — EPOETIN ALFA 20000 UNIT/ML IJ SOLN
20000.0000 [IU] | INTRAMUSCULAR | Status: DC
  Administered 2012-11-02: 20000 [IU] via SUBCUTANEOUS
  Filled 2012-11-02: qty 1

## 2012-11-23 ENCOUNTER — Encounter (HOSPITAL_COMMUNITY)
Admission: RE | Admit: 2012-11-23 | Discharge: 2012-11-23 | Disposition: A | Discharge status: Home / Self Care | Payer: Medicare Other | Source: Ambulatory Visit | Attending: Nephrology | Admitting: Nephrology

## 2012-11-23 DIAGNOSIS — N183 Chronic kidney disease, stage 3 unspecified: Secondary | ICD-10-CM | POA: Insufficient documentation

## 2012-11-23 DIAGNOSIS — D649 Anemia, unspecified: Secondary | ICD-10-CM | POA: Insufficient documentation

## 2012-11-23 LAB — IRON AND TIBC
Iron: 102 ug/dL (ref 42–135)
Saturation Ratios: 46 % (ref 20–55)
TIBC: 220 ug/dL (ref 215–435)
UIBC: 118 ug/dL — ABNORMAL LOW (ref 125–400)

## 2012-11-23 LAB — FERRITIN: Ferritin: 1225 ng/mL — ABNORMAL HIGH (ref 22–322)

## 2012-11-23 MED ORDER — EPOETIN ALFA 20000 UNIT/ML IJ SOLN
INTRAMUSCULAR | Status: AC
  Filled 2012-11-23: qty 1

## 2012-11-23 MED ORDER — EPOETIN ALFA 20000 UNIT/ML IJ SOLN
20000.0000 [IU] | INTRAMUSCULAR | Status: DC
  Administered 2012-11-23: 09:00:00 20000 [IU] via SUBCUTANEOUS

## 2012-11-28 ENCOUNTER — Other Ambulatory Visit: Payer: Self-pay | Admitting: Cardiology

## 2012-12-03 ENCOUNTER — Other Ambulatory Visit: Payer: Self-pay | Admitting: Cardiology

## 2012-12-03 ENCOUNTER — Other Ambulatory Visit: Payer: Self-pay | Admitting: Internal Medicine

## 2012-12-14 ENCOUNTER — Encounter (HOSPITAL_COMMUNITY)
Admission: RE | Admit: 2012-12-14 | Discharge: 2012-12-14 | Disposition: A | Discharge status: Home / Self Care | Payer: Medicare Other | Source: Ambulatory Visit | Attending: Nephrology | Admitting: Nephrology

## 2012-12-14 MED ORDER — EPOETIN ALFA 20000 UNIT/ML IJ SOLN
INTRAMUSCULAR | Status: AC
  Filled 2012-12-14: qty 1

## 2012-12-14 MED ORDER — EPOETIN ALFA 20000 UNIT/ML IJ SOLN
20000.0000 [IU] | INTRAMUSCULAR | Status: DC
  Administered 2012-12-14: 08:00:00 20000 [IU] via SUBCUTANEOUS

## 2013-01-03 ENCOUNTER — Other Ambulatory Visit (HOSPITAL_COMMUNITY): Payer: Self-pay | Admitting: *Deleted

## 2013-01-04 ENCOUNTER — Encounter (HOSPITAL_COMMUNITY)
Admission: RE | Admit: 2013-01-04 | Discharge: 2013-01-04 | Disposition: A | Discharge status: Home / Self Care | Payer: Medicare Other | Source: Ambulatory Visit | Attending: Nephrology | Admitting: Nephrology

## 2013-01-04 DIAGNOSIS — N183 Chronic kidney disease, stage 3 unspecified: Secondary | ICD-10-CM | POA: Insufficient documentation

## 2013-01-04 DIAGNOSIS — D649 Anemia, unspecified: Secondary | ICD-10-CM | POA: Insufficient documentation

## 2013-01-04 LAB — POCT HEMOGLOBIN-HEMACUE: Hemoglobin: 11.4 g/dL — ABNORMAL LOW (ref 13.0–17.0)

## 2013-01-04 MED ORDER — EPOETIN ALFA 20000 UNIT/ML IJ SOLN
INTRAMUSCULAR | Status: AC
  Administered 2013-01-04: 08:00:00 20000 [IU] via SUBCUTANEOUS
  Filled 2013-01-04: qty 1

## 2013-01-04 MED ORDER — EPOETIN ALFA 20000 UNIT/ML IJ SOLN: 20000.0000 [IU] | INTRAMUSCULAR | Status: DC

## 2013-01-25 ENCOUNTER — Encounter (HOSPITAL_COMMUNITY)
Admission: RE | Admit: 2013-01-25 | Discharge: 2013-01-25 | Disposition: A | Discharge status: Home / Self Care | Payer: Medicare Other | Source: Ambulatory Visit | Attending: Nephrology | Admitting: Nephrology

## 2013-01-25 DIAGNOSIS — N183 Chronic kidney disease, stage 3 unspecified: Secondary | ICD-10-CM | POA: Insufficient documentation

## 2013-01-25 DIAGNOSIS — D649 Anemia, unspecified: Secondary | ICD-10-CM | POA: Insufficient documentation

## 2013-01-25 MED ORDER — EPOETIN ALFA 20000 UNIT/ML IJ SOLN
INTRAMUSCULAR | Status: AC
  Administered 2013-01-25: 20000 [IU] via SUBCUTANEOUS
  Filled 2013-01-25: qty 1

## 2013-01-25 MED ORDER — EPOETIN ALFA 20000 UNIT/ML IJ SOLN: 20000.0000 [IU] | INTRAMUSCULAR | Status: DC

## 2013-02-05 ENCOUNTER — Encounter: Payer: Self-pay | Admitting: Family Medicine

## 2013-02-05 ENCOUNTER — Ambulatory Visit (INDEPENDENT_AMBULATORY_CARE_PROVIDER_SITE_OTHER): Payer: Medicare Other | Admitting: Family Medicine

## 2013-02-05 VITALS — BP 122/58 | HR 76 | Temp 98.5°F | Wt 141.8 lb

## 2013-02-05 DIAGNOSIS — R05 Cough: Secondary | ICD-10-CM

## 2013-02-05 DIAGNOSIS — R059 Cough, unspecified: Secondary | ICD-10-CM | POA: Insufficient documentation

## 2013-02-05 MED ORDER — DOXYCYCLINE HYCLATE 100 MG PO TABS: 100.0000 mg | ORAL_TABLET | Freq: Two times a day (BID) | ORAL | Status: DC

## 2013-02-05 NOTE — Patient Instructions (Signed)
Use the antibiotics if the cough continues and you don't gradually improve.  Take care.

## 2013-02-05 NOTE — Progress Notes (Signed)
Pre-visit discussion using our clinic review tool. No additional management support is needed unless otherwise documented below in the visit note.  Cough for about 1 week.  Started with a mild cough, then some sputum, discolored.  The las few days with brown sputum.  Occ SOB with getting up and walking at baseline; not SOB at rest.  No fevers.  He doesn't feel awful, but "so-so."  Cough is worse at night.  No ear pain.  Some rhinorrhea.  No ST.  No rash.  No NAVD.    Meds, vitals, and allergies reviewed.   ROS: See HPI.  Otherwise, noncontributory.  nad ncat Mmm Neck supple rrr Completely ctab, no wheeze, no rhonchi, no inc in WOB abd soft Ext w/o edema Normal radial pulses B

## 2013-02-05 NOTE — Assessment & Plan Note (Addendum)
Possible bronchitis.  Nontoxic, well appearing, appears younger than age.  Would hold doxy for now.  Use if sx prolonged or worsening.  He agrees. Totally ctab and okay for outpatient f/u.   Not fluid overloaded.

## 2013-02-06 ENCOUNTER — Ambulatory Visit: Payer: Medicare Other | Admitting: Internal Medicine

## 2013-02-15 ENCOUNTER — Encounter (HOSPITAL_COMMUNITY)
Admission: RE | Admit: 2013-02-15 | Discharge: 2013-02-15 | Disposition: A | Discharge status: Home / Self Care | Payer: Medicare Other | Source: Ambulatory Visit | Attending: Nephrology | Admitting: Nephrology

## 2013-02-15 DIAGNOSIS — D649 Anemia, unspecified: Secondary | ICD-10-CM | POA: Insufficient documentation

## 2013-02-15 DIAGNOSIS — N183 Chronic kidney disease, stage 3 unspecified: Secondary | ICD-10-CM | POA: Insufficient documentation

## 2013-02-15 LAB — POCT HEMOGLOBIN-HEMACUE: Hemoglobin: 10.7 g/dL — ABNORMAL LOW (ref 13.0–17.0)

## 2013-02-15 LAB — FERRITIN: Ferritin: 1446 ng/mL — ABNORMAL HIGH (ref 22–322)

## 2013-02-15 LAB — IRON AND TIBC
IRON: 76 ug/dL (ref 42–135)
Saturation Ratios: 40 % (ref 20–55)
TIBC: 191 ug/dL — AB (ref 215–435)
UIBC: 115 ug/dL — ABNORMAL LOW (ref 125–400)

## 2013-02-15 MED ORDER — EPOETIN ALFA 20000 UNIT/ML IJ SOLN
INTRAMUSCULAR | Status: AC
  Administered 2013-02-15: 20000 [IU] via SUBCUTANEOUS
  Filled 2013-02-15: qty 1

## 2013-02-15 MED ORDER — EPOETIN ALFA 20000 UNIT/ML IJ SOLN: 20000.0000 [IU] | INTRAMUSCULAR | Status: DC

## 2013-03-03 ENCOUNTER — Other Ambulatory Visit: Payer: Self-pay | Admitting: Internal Medicine

## 2013-03-08 ENCOUNTER — Encounter (HOSPITAL_COMMUNITY)
Admission: RE | Admit: 2013-03-08 | Discharge: 2013-03-08 | Disposition: A | Discharge status: Home / Self Care | Payer: Medicare Other | Source: Ambulatory Visit | Attending: Nephrology | Admitting: Nephrology

## 2013-03-08 LAB — POCT HEMOGLOBIN-HEMACUE: Hemoglobin: 10.9 g/dL — ABNORMAL LOW (ref 13.0–17.0)

## 2013-03-08 MED ORDER — EPOETIN ALFA 20000 UNIT/ML IJ SOLN
20000.0000 [IU] | INTRAMUSCULAR | Status: DC
  Administered 2013-03-08: 20000 [IU] via SUBCUTANEOUS

## 2013-03-08 MED ORDER — EPOETIN ALFA 20000 UNIT/ML IJ SOLN
INTRAMUSCULAR | Status: AC
  Filled 2013-03-08: qty 1

## 2013-03-19 ENCOUNTER — Other Ambulatory Visit: Payer: Self-pay | Admitting: Cardiology

## 2013-03-28 ENCOUNTER — Other Ambulatory Visit (HOSPITAL_COMMUNITY): Payer: Self-pay | Admitting: *Deleted

## 2013-03-29 ENCOUNTER — Encounter (HOSPITAL_COMMUNITY)
Admission: RE | Admit: 2013-03-29 | Discharge: 2013-03-29 | Disposition: A | Discharge status: Home / Self Care | Payer: Medicare Other | Source: Ambulatory Visit | Attending: Nephrology | Admitting: Nephrology

## 2013-03-29 DIAGNOSIS — N183 Chronic kidney disease, stage 3 unspecified: Secondary | ICD-10-CM | POA: Insufficient documentation

## 2013-03-29 DIAGNOSIS — D649 Anemia, unspecified: Secondary | ICD-10-CM | POA: Insufficient documentation

## 2013-03-29 LAB — POCT HEMOGLOBIN-HEMACUE: HEMOGLOBIN: 11 g/dL — AB (ref 13.0–17.0)

## 2013-03-29 MED ORDER — EPOETIN ALFA 20000 UNIT/ML IJ SOLN
20000.0000 [IU] | INTRAMUSCULAR | Status: DC
  Administered 2013-03-29: 20000 [IU] via SUBCUTANEOUS

## 2013-03-29 MED ORDER — EPOETIN ALFA 20000 UNIT/ML IJ SOLN
INTRAMUSCULAR | Status: AC
  Filled 2013-03-29: qty 1

## 2013-04-19 ENCOUNTER — Encounter (HOSPITAL_COMMUNITY)
Admission: RE | Admit: 2013-04-19 | Discharge: 2013-04-19 | Disposition: A | Discharge status: Home / Self Care | Payer: Medicare Other | Source: Ambulatory Visit | Attending: Nephrology | Admitting: Nephrology

## 2013-04-19 DIAGNOSIS — N183 Chronic kidney disease, stage 3 unspecified: Secondary | ICD-10-CM | POA: Insufficient documentation

## 2013-04-19 DIAGNOSIS — D649 Anemia, unspecified: Secondary | ICD-10-CM | POA: Insufficient documentation

## 2013-04-19 MED ORDER — EPOETIN ALFA 20000 UNIT/ML IJ SOLN: 20000.0000 [IU] | INTRAMUSCULAR | Status: DC

## 2013-04-19 MED ORDER — EPOETIN ALFA 20000 UNIT/ML IJ SOLN
INTRAMUSCULAR | Status: AC
  Administered 2013-04-19: 20000 [IU] via SUBCUTANEOUS
  Filled 2013-04-19: qty 1

## 2013-04-22 ENCOUNTER — Ambulatory Visit (INDEPENDENT_AMBULATORY_CARE_PROVIDER_SITE_OTHER): Payer: Medicare Other | Admitting: Internal Medicine

## 2013-04-22 ENCOUNTER — Encounter: Payer: Self-pay | Admitting: Internal Medicine

## 2013-04-22 VITALS — BP 138/60 | HR 63 | Temp 98.4°F | Ht 67.0 in | Wt 139.0 lb

## 2013-04-22 DIAGNOSIS — N183 Chronic kidney disease, stage 3 unspecified: Secondary | ICD-10-CM

## 2013-04-22 DIAGNOSIS — Z0000 Encounter for general adult medical examination without abnormal findings: Secondary | ICD-10-CM

## 2013-04-22 DIAGNOSIS — G3184 Mild cognitive impairment, so stated: Secondary | ICD-10-CM

## 2013-04-22 DIAGNOSIS — D638 Anemia in other chronic diseases classified elsewhere: Secondary | ICD-10-CM

## 2013-04-22 DIAGNOSIS — K219 Gastro-esophageal reflux disease without esophagitis: Secondary | ICD-10-CM

## 2013-04-22 DIAGNOSIS — I129 Hypertensive chronic kidney disease with stage 1 through stage 4 chronic kidney disease, or unspecified chronic kidney disease: Secondary | ICD-10-CM

## 2013-04-22 DIAGNOSIS — I10 Essential (primary) hypertension: Secondary | ICD-10-CM

## 2013-04-22 DIAGNOSIS — Z23 Encounter for immunization: Secondary | ICD-10-CM

## 2013-04-22 DIAGNOSIS — H2010 Chronic iridocyclitis, unspecified eye: Secondary | ICD-10-CM

## 2013-04-22 DIAGNOSIS — E785 Hyperlipidemia, unspecified: Secondary | ICD-10-CM

## 2013-04-22 LAB — POCT HEMOGLOBIN-HEMACUE: Hemoglobin: 10.3 g/dL — ABNORMAL LOW (ref 13.0–17.0)

## 2013-04-22 NOTE — Addendum Note (Signed)
Addended by: Sueanne MargaritaSMITH, DESHANNON L on: 04/22/2013 11:18 AM   Modules accepted: Orders

## 2013-04-22 NOTE — Assessment & Plan Note (Signed)
BP Readings from Last 3 Encounters:  04/22/13 138/60  04/19/13 147/66  03/29/13 116/49   Good control No changes needed

## 2013-04-22 NOTE — Assessment & Plan Note (Signed)
On steroid drops Ophtho follows

## 2013-04-22 NOTE — Assessment & Plan Note (Signed)
Sees hematologist and gets iron regularly

## 2013-04-22 NOTE — Assessment & Plan Note (Signed)
He recognizes some decline in memory No true apraxia Wife will observe for progression  No action now

## 2013-04-22 NOTE — Assessment & Plan Note (Signed)
Controlled with the PPI 

## 2013-04-22 NOTE — Progress Notes (Signed)
Pre visit review using our clinic review tool, if applicable. No additional management support is needed unless otherwise documented below in the visit note. 

## 2013-04-22 NOTE — Assessment & Plan Note (Signed)
I have personally reviewed the Medicare Annual Wellness questionnaire and have noted 1. The patient's medical and social history 2. Their use of alcohol, tobacco or illicit drugs 3. Their current medications and supplements 4. The patient's functional ability including ADL's, fall risks, home safety risks and hearing or visual             impairment. 5. Diet and physical activities 6. Evidence for depression or mood disorders  The patients weight, height, BMI and visual acuity have been recorded in the chart I have made referrals, counseling and provided education to the patient based review of the above and I have provided the pt with a written personalized care plan for preventive services.  I have provided you with a copy of your personalized plan for preventive services. Please take the time to review along with your updated medication list.  No cancer screening due to age prevnar today

## 2013-04-22 NOTE — Assessment & Plan Note (Signed)
Will continue statin due to known vascular disease Likely will protect against further cognitive decline

## 2013-04-22 NOTE — Progress Notes (Signed)
Subjective:    Patient ID: Anthony Werner, male    DOB: 12/08/1924, 80 y.o.   MRN: [id number]  HPI Here for initial Medicare wellness visit and follow up Reviewed advanced directives No falls No depression. Harder to do the things he enjoys, but not anhedonic Reviewed other doctors Does exercise some---limited at times by back pain No tobacco or alcohol Has noted some memory problems Has hearing and vision problems. Right ear is the main issues (and right eye) Independent with instrumental ADLs and still drives. Some trouble with steps  Keeps up with vascular surgery No stroke type symptoms Rare headaches  No chest pain Gets DOE with extended walking--no recent change Occasional feels heart racing---with the DOE No edema  Ongoing problems with his right eye Still uses steroid drops for uveitis  Regular visits with hematologist Gets iron infusions when indicated  No stomach problems Will occasionally have problems swallowing pills--takes them in jello Still on PPI  Current Outpatient Prescriptions on File Prior to Visit  Medication Sig Dispense Refill  . amLODipine (NORVASC) 10 MG tablet Take 10 mg by mouth daily.      Marland Kitchen. aspirin 81 MG tablet Take 81 mg by mouth daily.        Marland Kitchen. b complex vitamins capsule Take 1 capsule by mouth daily. Plus  C      . carvedilol (COREG) 12.5 MG tablet TAKE 1 TABLET (12.5 MG TOTAL) BY MOUTH 2 (TWO) TIMES DAILY WITH A MEAL.  180 tablet  2  . clopidogrel (PLAVIX) 75 MG tablet TAKE 1 TABLET (75 MG TOTAL) BY MOUTH DAILY.  90 tablet  1  . famciclovir (FAMVIR) 500 MG tablet Take 500 mg by mouth 2 (two) times daily.       . furosemide (LASIX) 40 MG tablet TAKE 1 TABLET BY MOUTH DAILY.  90 tablet  0  . isosorbide mononitrate (IMDUR) 30 MG 24 hr tablet TAKE 1 TABLET BY MOUTH DAILY.  30 tablet  3  . lisinopril (PRINIVIL,ZESTRIL) 20 MG tablet TAKE 1 TABLET BY MOUTH EVERY DAY  90 tablet  3  . omeprazole (PRILOSEC) 20 MG capsule TAKE 1 CAPSULE (20 MG  TOTAL) BY MOUTH 2 (TWO) TIMES DAILY.  180 capsule  3  . pravastatin (PRAVACHOL) 40 MG tablet TAKE 1 TABLET (40 MG TOTAL) BY MOUTH DAILY.  90 tablet  3  . prednisoLONE acetate (PRED FORTE) 1 % ophthalmic suspension Place 1 drop into the right eye 2 (two) times daily.       Marland Kitchen. VITAMIN D, CHOLECALCIFEROL, PO Take 1 capsule by mouth daily.        Current Facility-Administered Medications on File Prior to Visit  Medication Dose Route Frequency Provider Last Rate Last Dose  . ferumoxytol [facility](FERAHEME) injection 510 mg  510 mg Intravenous Once Dyke MaesMichael T Mattingly, MD        Allergies  Allergen Reactions  . Atenolol   . Diltiazem   . Hydrochlorothiazide   . Labetalol   . Lovastatin   . Niacin   . Simvastatin   . Terazosin   . Iron     Just had constipation    Past Medical History  Diagnosis Date  . Hypertension   . Hyperlipidemia   . Anemia of chronic disease 04/2009    hgb 8...transfusion 04/2009  . CHF (congestive heart failure) 04/07/09    CHF while in hospital February, 2011, EF 45%  . Esophageal stricture     Dilatation and VA, / dilatation later  by Dr. Arlyce Dice  . CKD (chronic kidney disease), stage II   . Carotid artery disease     Total right carotid, 60-79% LICA Dr. Edilia Bo, July, 2011 / left carotid endarterectomy January, 2012     . Community acquired pneumonia   . GERD (gastroesophageal reflux disease)   . Gout   . BPH (benign prostatic hyperplasia)   . Impaired fasting glucose   . Ejection fraction     45% by history  . Chest pain     Nuclear, April, 2011, no scar or ischemia    Past Surgical History  Procedure Laterality Date  . Cataract extraction    . Hernia repair    . Tonsillectomy    . Esophageal dilation      x3  . Carotid endarterectomy  LEFT    03/10/2010  . Iron injections  Sept. 2013 , Aug. 2014    Every 3 weeks    Family History  Problem Relation Age of Onset  . Cancer Father     Prostate  . Hypertension Father   . Heart disease Father      Heart Disease before age 27  . Heart disease Brother     x2  . Hypertension Brother   . Hypertension Mother   . Hypertension Sister   . Hypertension Daughter     History   Social History  . Marital Status: Married    Spouse Name: N/A    Number of Children: 3  . Years of Education: N/A   Occupational History  . retired, Doctor, hospital    Social History Main Topics  . Smoking status: Former Smoker    Types: Cigarettes    Quit date: 02/15/1967  . Smokeless tobacco: Never Used  . Alcohol Use: No  . Drug Use: No  . Sexual Activity: Not on file   Other Topics Concern  . Not on file   Social History Narrative   Has living will    Wife, then oldest daughter to make health care decisions Luster Landsberg)    Will accept resuscitation but no prolonged mechanical ventilation   Not sure about tube feeds, might accept   Review of Systems Appetite is okay Weight is fairly stable Sleeps well Bowels have been okay Voids okay---some delay in initiation, but better recently. nocturia x 2---stable      Objective:   Physical Exam  Constitutional: He is oriented to person, place, and time. He appears well-developed and well-nourished. No distress.  HENT:  Mouth/Throat: Oropharynx is clear and moist. No oropharyngeal exudate.  Neck: Normal range of motion. Neck supple. No thyromegaly present.  Cardiovascular: Normal rate, regular rhythm, normal heart sounds and intact distal pulses.  Exam reveals no gallop.   No murmur heard. Pulmonary/Chest: Effort normal and breath sounds normal. No respiratory distress. He has no wheezes. He has no rales.  Abdominal: Soft. There is no tenderness.  Musculoskeletal: He exhibits no edema and no tenderness.  Lymphadenopathy:    He has no cervical adenopathy.  Neurological: He is alert and oriented to person, place, and time.  President-- "Obama, Bush, Clinton" [phone] [id number] Recall 0/3  Psychiatric: He has a normal mood and affect. His  behavior is normal.          Assessment & Plan:

## 2013-04-22 NOTE — Assessment & Plan Note (Signed)
Lab Results  Component Value Date   CREATININE 1.5 10/22/2012   Has been stable On ACEI

## 2013-04-23 ENCOUNTER — Telehealth: Payer: Self-pay | Admitting: Internal Medicine

## 2013-04-23 NOTE — Telephone Encounter (Signed)
Relevant patient education mailed to patient.  

## 2013-04-25 ENCOUNTER — Encounter: Payer: Self-pay | Admitting: Cardiology

## 2013-05-10 ENCOUNTER — Encounter (HOSPITAL_COMMUNITY)
Admission: RE | Admit: 2013-05-10 | Discharge: 2013-05-10 | Disposition: A | Discharge status: Home / Self Care | Payer: Medicare Other | Source: Ambulatory Visit | Attending: Nephrology | Admitting: Nephrology

## 2013-05-10 LAB — POCT HEMOGLOBIN-HEMACUE: Hemoglobin: 11.2 g/dL — ABNORMAL LOW (ref 13.0–17.0)

## 2013-05-10 MED ORDER — EPOETIN ALFA 20000 UNIT/ML IJ SOLN
20000.0000 [IU] | INTRAMUSCULAR | Status: DC
  Administered 2013-05-10: 20000 [IU] via SUBCUTANEOUS

## 2013-05-10 MED ORDER — EPOETIN ALFA 20000 UNIT/ML IJ SOLN
INTRAMUSCULAR | Status: AC
  Filled 2013-05-10: qty 1

## 2013-05-29 ENCOUNTER — Other Ambulatory Visit: Payer: Self-pay | Admitting: Cardiology

## 2013-05-31 ENCOUNTER — Encounter (HOSPITAL_COMMUNITY)
Admission: RE | Admit: 2013-05-31 | Discharge: 2013-05-31 | Disposition: A | Discharge status: Home / Self Care | Payer: Medicare Other | Source: Ambulatory Visit | Attending: Nephrology | Admitting: Nephrology

## 2013-05-31 DIAGNOSIS — N183 Chronic kidney disease, stage 3 unspecified: Secondary | ICD-10-CM | POA: Insufficient documentation

## 2013-05-31 DIAGNOSIS — D649 Anemia, unspecified: Secondary | ICD-10-CM | POA: Insufficient documentation

## 2013-05-31 LAB — IRON AND TIBC
IRON: 93 ug/dL (ref 42–135)
SATURATION RATIOS: 38 % (ref 20–55)
TIBC: 245 ug/dL (ref 215–435)
UIBC: 152 ug/dL (ref 125–400)

## 2013-05-31 LAB — FERRITIN: Ferritin: 1567 ng/mL — ABNORMAL HIGH (ref 22–322)

## 2013-05-31 LAB — POCT HEMOGLOBIN-HEMACUE: HEMOGLOBIN: 11 g/dL — AB (ref 13.0–17.0)

## 2013-05-31 MED ORDER — EPOETIN ALFA 20000 UNIT/ML IJ SOLN
INTRAMUSCULAR | Status: AC
  Administered 2013-05-31: 20000 [IU] via SUBCUTANEOUS
  Filled 2013-05-31: qty 1

## 2013-05-31 MED ORDER — EPOETIN ALFA 20000 UNIT/ML IJ SOLN
20000.0000 [IU] | INTRAMUSCULAR | Status: DC
  Administered 2013-05-31: 20000 [IU] via SUBCUTANEOUS

## 2013-06-04 ENCOUNTER — Other Ambulatory Visit: Payer: Self-pay | Admitting: Internal Medicine

## 2013-06-21 ENCOUNTER — Encounter (HOSPITAL_COMMUNITY)
Admission: RE | Admit: 2013-06-21 | Discharge: 2013-06-21 | Disposition: A | Discharge status: Home / Self Care | Payer: Medicare Other | Source: Ambulatory Visit | Attending: Nephrology | Admitting: Nephrology

## 2013-06-21 DIAGNOSIS — D649 Anemia, unspecified: Secondary | ICD-10-CM | POA: Insufficient documentation

## 2013-06-21 DIAGNOSIS — N183 Chronic kidney disease, stage 3 unspecified: Secondary | ICD-10-CM | POA: Insufficient documentation

## 2013-06-21 LAB — POCT HEMOGLOBIN-HEMACUE: Hemoglobin: 10 g/dL — ABNORMAL LOW (ref 13.0–17.0)

## 2013-06-21 MED ORDER — EPOETIN ALFA 20000 UNIT/ML IJ SOLN
20000.0000 [IU] | INTRAMUSCULAR | Status: DC
  Administered 2013-06-21: 20000 [IU] via SUBCUTANEOUS

## 2013-06-21 MED ORDER — EPOETIN ALFA 20000 UNIT/ML IJ SOLN
INTRAMUSCULAR | Status: AC
  Filled 2013-06-21: qty 1

## 2013-07-12 ENCOUNTER — Encounter (HOSPITAL_COMMUNITY)
Admission: RE | Admit: 2013-07-12 | Discharge: 2013-07-12 | Disposition: A | Discharge status: Home / Self Care | Payer: Medicare Other | Source: Ambulatory Visit | Attending: Nephrology | Admitting: Nephrology

## 2013-07-12 LAB — POCT HEMOGLOBIN-HEMACUE: Hemoglobin: 10.2 g/dL — ABNORMAL LOW (ref 13.0–17.0)

## 2013-07-12 MED ORDER — EPOETIN ALFA 20000 UNIT/ML IJ SOLN
INTRAMUSCULAR | Status: AC
  Administered 2013-07-12: 20000 [IU] via SUBCUTANEOUS
  Filled 2013-07-12: qty 1

## 2013-07-12 MED ORDER — EPOETIN ALFA 20000 UNIT/ML IJ SOLN: 20000.0000 [IU] | INTRAMUSCULAR | Status: DC

## 2013-07-15 ENCOUNTER — Encounter: Payer: Self-pay | Admitting: Cardiology

## 2013-07-17 ENCOUNTER — Ambulatory Visit (INDEPENDENT_AMBULATORY_CARE_PROVIDER_SITE_OTHER): Payer: Medicare Other | Admitting: Cardiology

## 2013-07-17 ENCOUNTER — Encounter: Payer: Self-pay | Admitting: Cardiology

## 2013-07-17 VITALS — BP 148/58 | HR 63 | Ht 67.0 in | Wt 140.0 lb

## 2013-07-17 DIAGNOSIS — I10 Essential (primary) hypertension: Secondary | ICD-10-CM

## 2013-07-17 DIAGNOSIS — I739 Peripheral vascular disease, unspecified: Secondary | ICD-10-CM

## 2013-07-17 DIAGNOSIS — E785 Hyperlipidemia, unspecified: Secondary | ICD-10-CM

## 2013-07-17 DIAGNOSIS — R079 Chest pain, unspecified: Secondary | ICD-10-CM

## 2013-07-17 DIAGNOSIS — I779 Disorder of arteries and arterioles, unspecified: Secondary | ICD-10-CM

## 2013-07-17 NOTE — Assessment & Plan Note (Signed)
He is 80 years of age. He is stable on his current statin. I feel it is not prudent to change his meds.

## 2013-07-17 NOTE — Assessment & Plan Note (Signed)
Blood pressure is adequately controlled for him. No change in therapy.

## 2013-07-17 NOTE — Assessment & Plan Note (Signed)
Patient has significant carotid disease. It is followed very carefully by vascular surgery.

## 2013-07-17 NOTE — Progress Notes (Signed)
Patient ID: Anthony Werner, male   DOB: 1924-09-26, 80 y.o.   MRN: [id number]    HPI   Patient is seen today for one-year followup of his coronary disease. He is stable. He has significant carotid disease this is followed by vascular surgery. He is not having any significant chest pain. He is fully active. He does have some mild exertional shortness of breath. This is a chronic problem with no change.  Allergies  Allergen Reactions  . Atenolol   . Diltiazem   . Hydrochlorothiazide   . Labetalol   . Lovastatin   . Niacin   . Simvastatin   . Terazosin   . Iron     Just had constipation    Current Outpatient Prescriptions  Medication Sig Dispense Refill  . amLODipine (NORVASC) 10 MG tablet Take 10 mg by mouth daily.      Marland Kitchen aspirin 81 MG tablet Take 81 mg by mouth daily.        Marland Kitchen b complex vitamins capsule Take 1 capsule by mouth daily. Plus  C      . carvedilol (COREG) 12.5 MG tablet TAKE 1 TABLET BY MOUTH TWICE A DAY WITH A MEAL  180 tablet  0  . clopidogrel (PLAVIX) 75 MG tablet TAKE 1 TABLET (75 MG TOTAL) BY MOUTH DAILY.  90 tablet  0  . famciclovir (FAMVIR) 500 MG tablet Take 500 mg by mouth 2 (two) times daily.       . furosemide (LASIX) 40 MG tablet TAKE 1 TABLET BY MOUTH DAILY.  90 tablet  0  . isosorbide mononitrate (IMDUR) 30 MG 24 hr tablet TAKE 1 TABLET BY MOUTH DAILY.  30 tablet  3  . lisinopril (PRINIVIL,ZESTRIL) 20 MG tablet TAKE 1 TABLET BY MOUTH EVERY DAY  90 tablet  3  . omeprazole (PRILOSEC) 20 MG capsule TAKE 1 CAPSULE (20 MG TOTAL) BY MOUTH 2 (TWO) TIMES DAILY.  180 capsule  3  . pravastatin (PRAVACHOL) 40 MG tablet TAKE 1 TABLET (40 MG TOTAL) BY MOUTH DAILY.  90 tablet  3  . prednisoLONE acetate (PRED FORTE) 1 % ophthalmic suspension Place 1 drop into the right eye 2 (two) times daily.       Marland Kitchen VITAMIN D, CHOLECALCIFEROL, PO Take 1 capsule by mouth daily.        No current facility-administered medications for this visit.   Facility-Administered Medications  Ordered in Other Visits  Medication Dose Route Frequency Provider Last Rate Last Dose  . ferumoxytol [facility]) injection 510 mg  510 mg Intravenous Once Dyke Maes, MD        History   Social History  . Marital Status: Married    Spouse Name: N/A    Number of Children: 3  . Years of Education: N/A   Occupational History  . retired, Doctor, hospital    Social History Main Topics  . Smoking status: Former Smoker    Types: Cigarettes    Quit date: 02/15/1967  . Smokeless tobacco: Never Used  . Alcohol Use: No  . Drug Use: No  . Sexual Activity: Not on file   Other Topics Concern  . Not on file   Social History Narrative   Has living will    Wife, then oldest daughter to make health care decisions Luster Landsberg)    Will accept resuscitation but no prolonged mechanical ventilation   Not sure about tube feeds, might accept    Family History  Problem Relation Age of Onset  .  Cancer Father     Prostate  . Hypertension Father   . Heart disease Father     Heart Disease before age 8  . Heart disease Brother     x2  . Hypertension Brother   . Hypertension Mother   . Hypertension Sister   . Hypertension Daughter     Past Medical History  Diagnosis Date  . Hypertension   . Hyperlipidemia   . Anemia of chronic disease 04/2009    hgb 8...transfusion 04/2009  . CHF (congestive heart failure) 04/07/09    CHF while in hospital February, 2011, EF 45%  . Esophageal stricture     Dilatation and VA, / dilatation later by Dr. Arlyce Dice  . CKD (chronic kidney disease), stage II   . Carotid artery disease     Total right carotid, 60-79% LICA Dr. Edilia Bo, July, 2011 / left carotid endarterectomy January, 2012     . Community acquired pneumonia   . GERD (gastroesophageal reflux disease)   . Gout   . BPH (benign prostatic hyperplasia)   . Impaired fasting glucose   . Ejection fraction     45% by history  . Chest pain     Nuclear, April, 2011, no scar or ischemia    Past  Surgical History  Procedure Laterality Date  . Cataract extraction    . Hernia repair    . Tonsillectomy    . Esophageal dilation      x3  . Carotid endarterectomy  LEFT    03/10/2010  . Iron injections  Sept. 2013 , Aug. 2014    Every 3 weeks    Patient Active Problem List   Diagnosis Date Noted  . Carotid artery disease     Priority: High  . GERD (gastroesophageal reflux disease)     Priority: High  . Ejection fraction     Priority: High  . Chest pain     Priority: High  . Routine general medical examination at a health care facility 04/22/2013  . Mild cognitive impairment 04/22/2013  . Aftercare following surgery of the circulatory system, NEC 10/24/2012  . Allergic rhinitis due to pollen 10/22/2012  . Occlusion and stenosis of carotid artery without mention of cerebral infarction 10/19/2011  . 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 Hypertension   . Hyperlipidemia   . GOUT 07/15/2009  . BENIGN PROSTATIC HYPERTROPHY 07/15/2009  . IMPAIRED FASTING GLUCOSE 07/15/2009  . CHRONIC KIDNEY DISEASE STAGE III (MODERATE) 04/30/2009  . ESOPHAGEAL STRICTURE 04/15/2009  . Anemia of chronic disease 04/14/2009    ROS   Patient denies fever, chills, headache, sweats, rash, change in vision, change in hearing, chest pain, cough, nausea vomiting, urinary symptoms. All other systems are reviewed and are negative.  PHYSICAL EXAM  Patient looks quite good. He is very active. He is here with his wife. He is oriented to person time and place. Affect is normal. Head is atraumatic. Sclera and conjunctiva are normal. There is no jugulovenous distention. There are carotid bruits that it did hurt in the past. Lungs are clear. Respiratory effort is nonlabored. Cardiac exam reveals S1 and S2. There no clicks or significant murmurs. The abdomen is soft. There is no peripheral edema. There are no musculoskeletal deformities. There are no skin rashes.  Filed Vitals:   07/17/13 0918  BP: 148/58  Pulse:  63  Height: 5\' 7"  (1.702 m)  Weight: 140 lb (63.504 kg)   EKG is done today and reviewed by me. There is sinus rhythm. One PVC  is seen. There is no change in the QRS when compared to the past.  ASSESSMENT & PLAN

## 2013-07-17 NOTE — Patient Instructions (Signed)
**Note De-identified  Obfuscation** Your physician recommends that you continue on your current medications as directed. Please refer to the Current Medication list given to you today.  Your physician wants you to follow-up in: 1 year. You will receive a reminder letter in the mail two months in advance. If you don't receive a letter, please call our office to schedule the follow-up appointment.  

## 2013-07-30 ENCOUNTER — Other Ambulatory Visit: Payer: Self-pay | Admitting: *Deleted

## 2013-07-30 MED ORDER — ISOSORBIDE MONONITRATE ER 30 MG PO TB24: ORAL_TABLET | ORAL | Status: DC

## 2013-08-02 ENCOUNTER — Encounter (HOSPITAL_COMMUNITY): Payer: Medicare Other

## 2013-08-02 ENCOUNTER — Encounter (HOSPITAL_COMMUNITY)
Admission: RE | Admit: 2013-08-02 | Discharge: 2013-08-02 | Disposition: A | Discharge status: Home / Self Care | Payer: Medicare Other | Source: Ambulatory Visit | Attending: Nephrology | Admitting: Nephrology

## 2013-08-02 DIAGNOSIS — D649 Anemia, unspecified: Secondary | ICD-10-CM | POA: Insufficient documentation

## 2013-08-02 DIAGNOSIS — N183 Chronic kidney disease, stage 3 unspecified: Secondary | ICD-10-CM | POA: Insufficient documentation

## 2013-08-02 MED ORDER — EPOETIN ALFA 20000 UNIT/ML IJ SOLN
INTRAMUSCULAR | Status: AC
  Filled 2013-08-02: qty 1

## 2013-08-02 MED ORDER — EPOETIN ALFA 20000 UNIT/ML IJ SOLN
20000.0000 [IU] | INTRAMUSCULAR | Status: DC
  Administered 2013-08-02: 20000 [IU] via SUBCUTANEOUS

## 2013-08-05 LAB — POCT HEMOGLOBIN-HEMACUE: Hemoglobin: 10.5 g/dL — ABNORMAL LOW (ref 13.0–17.0)

## 2013-08-13 ENCOUNTER — Ambulatory Visit (INDEPENDENT_AMBULATORY_CARE_PROVIDER_SITE_OTHER): Payer: Medicare Other | Admitting: Internal Medicine

## 2013-08-13 ENCOUNTER — Encounter: Payer: Self-pay | Admitting: Internal Medicine

## 2013-08-13 VITALS — BP 148/68 | HR 60 | Temp 97.7°F | Wt 147.0 lb

## 2013-08-13 DIAGNOSIS — R609 Edema, unspecified: Secondary | ICD-10-CM

## 2013-08-13 MED ORDER — FUROSEMIDE 40 MG PO TABS: 40.0000 mg | ORAL_TABLET | Freq: Every day | ORAL | Status: DC

## 2013-08-13 NOTE — Progress Notes (Signed)
Pre visit review using our clinic review tool, if applicable. No additional management support is needed unless otherwise documented below in the visit note. 

## 2013-08-13 NOTE — Progress Notes (Signed)
Subjective:    Patient ID: Anthony Werner, male    DOB: 02/11/1925, 80 y.o.   MRN: [id number]  HPI Here with wife Has noticed swelling in feet and ankles in past week Doesn't routinely monitor weight--may be up some  No recent travel No change in diet Probably sitting more with his feet down Better in Am but still swollen Has stable DOE and chest pain---if he has extended walking. Quickly resolves with rest No dizziness or syncope  Generally sleeps okay Occasionally awakens with sweat.  Props on 2 pillows--recent change. But no PND Nocturia stable x 2  Current Outpatient Prescriptions on File Prior to Visit  Medication Sig Dispense Refill  . amLODipine (NORVASC) 10 MG tablet Take 10 mg by mouth daily.      Marland Kitchen. aspirin 81 MG tablet Take 81 mg by mouth daily.        Marland Kitchen. b complex vitamins capsule Take 1 capsule by mouth daily. Plus  C      . carvedilol (COREG) 12.5 MG tablet TAKE 1 TABLET BY MOUTH TWICE A DAY WITH A MEAL  180 tablet  0  . clopidogrel (PLAVIX) 75 MG tablet TAKE 1 TABLET (75 MG TOTAL) BY MOUTH DAILY.  90 tablet  0  . famciclovir (FAMVIR) 500 MG tablet Take 500 mg by mouth 2 (two) times daily.       . furosemide (LASIX) 40 MG tablet TAKE 1 TABLET BY MOUTH DAILY.  90 tablet  0  . isosorbide mononitrate (IMDUR) 30 MG 24 hr tablet TAKE 1 TABLET BY MOUTH DAILY.  30 tablet  11  . lisinopril (PRINIVIL,ZESTRIL) 20 MG tablet TAKE 1 TABLET BY MOUTH EVERY DAY  90 tablet  3  . omeprazole (PRILOSEC) 20 MG capsule TAKE 1 CAPSULE (20 MG TOTAL) BY MOUTH 2 (TWO) TIMES DAILY.  180 capsule  3  . pravastatin (PRAVACHOL) 40 MG tablet TAKE 1 TABLET (40 MG TOTAL) BY MOUTH DAILY.  90 tablet  3  . prednisoLONE acetate (PRED FORTE) 1 % ophthalmic suspension Place 1 drop into the right eye 2 (two) times daily.       Marland Kitchen. VITAMIN D, CHOLECALCIFEROL, PO Take 1 capsule by mouth daily.        Current Facility-Administered Medications on File Prior to Visit  Medication Dose Route Frequency Provider Last  Rate Last Dose  . ferumoxytol [facility](FERAHEME) injection 510 mg  510 mg Intravenous Once Dyke MaesMichael T Mattingly, MD        Allergies  Allergen Reactions  . Atenolol   . Diltiazem   . Hydrochlorothiazide   . Labetalol   . Lovastatin   . Niacin   . Simvastatin   . Terazosin   . Iron     Just had constipation    Past Medical History  Diagnosis Date  . Hypertension   . Hyperlipidemia   . Anemia of chronic disease 04/2009    hgb 8...transfusion 04/2009  . CHF (congestive heart failure) 04/07/09    CHF while in hospital February, 2011, EF 45%  . Esophageal stricture     Dilatation and VA, / dilatation later by Dr. Arlyce DiceKaplan  . CKD (chronic kidney disease), stage II   . Carotid artery disease     Total right carotid, 60-79% LICA Dr. Edilia Bodickson, July, 2011 / left carotid endarterectomy January, 2012     . Community acquired pneumonia   . GERD (gastroesophageal reflux disease)   . Gout   . BPH (benign prostatic hyperplasia)   .  Impaired fasting glucose   . Ejection fraction     45% by history  . Chest pain     Nuclear, April, 2011, no scar or ischemia    Past Surgical History  Procedure Laterality Date  . Cataract extraction    . Hernia repair    . Tonsillectomy    . Esophageal dilation      x3  . Carotid endarterectomy  LEFT    03/10/2010  . Iron injections  Sept. 2013 , Aug. 2014    Every 3 weeks    Family History  Problem Relation Age of Onset  . Cancer Father     Prostate  . Hypertension Father   . Heart disease Father     Heart Disease before age 260  . Heart disease Brother     x2  . Hypertension Brother   . Hypertension Mother   . Hypertension Sister   . Hypertension Daughter     History   Social History  . Marital Status: Married    Spouse Name: N/A    Number of Children: 3  . Years of Education: N/A   Occupational History  . 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 Smoking status: Former Smoker    Types: Cigarettes    Quit date: 02/15/1967   . Smokeless tobacco: Never Used  . Alcohol Use: No  . Drug Use: No  . Sexual Activity: Not on file   Other Topics Concern  . Not on file   Social History Narrative   Has living will    Wife, then oldest daughter to make health care decisions Luster Landsberg(Renee)    Will accept resuscitation but no prolonged mechanical ventilation   Not sure about tube feeds, might accept   Review of Systems Appetite is good Bowels are okay No sores or pain in feet Due for eye procedure due to uveitis and glaucoma     Objective:   Physical Exam  Constitutional: He appears well-developed and well-nourished. No distress.  Neck: Normal range of motion. Neck supple. No JVD present. No thyromegaly present.  Cardiovascular: Normal rate and regular rhythm.  Exam reveals no gallop.   Soft systolic murmur towards apex  Pulmonary/Chest: Effort normal and breath sounds normal. No respiratory distress. He has no wheezes. He has no rales.  No dullness  Musculoskeletal:  1+ pitting edema in ankles and feet  Skin:  No foot ulcers  Psychiatric: He has a normal mood and affect. His behavior is normal.          Assessment & Plan:

## 2013-08-13 NOTE — Patient Instructions (Signed)
Please check your weight daily. Call if your weight keeps going up, or if you breathing gets worse or the leg swelling doesn't improve. Keep your feet elevated. Try 20-5930mm Hg support socks (knee high) You can take a second furosemide 40mg  around lunch time on days that your feet are still swollen.  Low-Sodium Eating Plan Sodium raises blood pressure and causes water to be held in the body. Getting less sodium from food will help lower your blood pressure, reduce any swelling, and protect your heart, liver, and kidneys. We get sodium by adding salt (sodium chloride) to food. Most of our sodium comes from canned, boxed, and frozen foods. Restaurant foods, fast foods, and pizza are also very high in sodium. Even if you take medicine to lower your blood pressure or to reduce fluid in your body, getting less sodium from your food is important. WHAT IS MY PLAN? Most people should limit their sodium intake to 2,300 mg a day. Your health care provider recommends that you limit your sodium intake to __________ a day.  WHAT DO I NEED TO KNOW ABOUT THIS EATING PLAN? For the low-sodium eating plan, you will follow these general guidelines:  Choose foods with a % Daily Value for sodium of less than 5% (as listed on the food label).   Use salt-free seasonings or herbs instead of table salt or sea salt.   Check with your health care provider or pharmacist before using salt substitutes.   Eat fresh foods.  Eat more vegetables and fruits.  Limit canned vegetables. If you do use them, rinse them well to decrease the sodium.   Limit cheese to 1 oz (28 g) per day.   Eat lower-sodium products, often labeled as "lower sodium" or "no salt added."  Avoid foods that contain monosodium glutamate (MSG). MSG is sometimes added to [place] food and some canned foods.  Check food labels (Nutrition Facts labels) on foods to learn how much sodium is in one serving.  Eat more home-cooked food and less  restaurant, buffet, and fast food.  When eating at a restaurant, ask that your food be prepared with less salt or none, if possible.  HOW DO I READ FOOD LABELS FOR SODIUM INFORMATION? The Nutrition Facts label lists the amount of sodium in one serving of the food. If you eat more than one serving, you must multiply the listed amount of sodium by the number of servings. Food labels may also identify foods as:  Sodium free--Less than 5 mg in a serving.  Very low sodium--35 mg or less in a serving.  Low sodium--140 mg or less in a serving.  Light in sodium--50% less sodium in a serving. For example, if a food that usually has 300 mg of sodium is changed to become light in sodium, it will have 150 mg of sodium.  Reduced sodium--25% less sodium in a serving. For example, if a food that usually has 400 mg of sodium is changed to reduced sodium, it will have 300 mg of sodium. WHAT FOODS CAN I EAT? Grains Low-sodium cereals, including oats, puffed wheat and rice, and shredded wheat cereals. Low-sodium crackers. Unsalted rice and pasta. Lower-sodium bread.  Vegetables Frozen or fresh vegetables. Low-sodium or reduced-sodium canned vegetables. Low-sodium or reduced-sodium tomato sauce and paste. Low-sodium or reduced-sodium tomato and vegetable juices.  Fruits Fresh, frozen, and canned fruit. Fruit juice.  Meat and Other Protein Products Low-sodium canned tuna and salmon. Fresh or frozen meat, poultry, seafood, and fish. Lamb. Unsalted nuts.  Dried beans, peas, and lentils without added salt. Unsalted canned beans. Homemade soups without salt. Eggs.  Dairy Milk. Soy milk. Ricotta cheese. Low-sodium or reduced-sodium cheeses. Yogurt.  Condiments Fresh and dried herbs and spices. Salt-free seasonings. Onion and garlic powders. Low-sodium varieties of mustard and ketchup. Lemon juice.  Fats and Oils Reduced-sodium salad dressings. Unsalted butter.  Other Unsalted popcorn and pretzels.    The items listed above may not be a complete list of recommended foods or beverages. Contact your dietitian for more options. WHAT FOODS ARE NOT RECOMMENDED? Grains Instant hot cereals. Bread stuffing, pancake, and biscuit mixes. Croutons. Seasoned rice or pasta mixes. Noodle soup cups. Boxed or frozen macaroni and cheese. Self-rising flour. Regular salted crackers. Vegetables Regular canned vegetables. Regular canned tomato sauce and paste. Regular tomato and vegetable juices. Frozen vegetables in sauces. Salted french fries. Olives. Rosita FirePickles. Relishes. Sauerkraut. Salsa. Meat and Other Protein Products Salted, canned, smoked, spiced, or pickled meats, seafood, or fish. Bacon, ham, sausage, hot dogs, corned beef, chipped beef, and packaged luncheon meats. Salt pork. Jerky. Pickled herring. Anchovies, regular canned tuna, and sardines. Salted nuts. Dairy Processed cheese and cheese spreads. Cheese curds. Blue cheese and cottage cheese. Buttermilk.  Condiments Onion and garlic salt, seasoned salt, table salt, and sea salt. Canned and packaged gravies. Worcestershire sauce. Tartar sauce. Barbecue sauce. Teriyaki sauce. Soy sauce, including reduced sodium. Steak sauce. Fish sauce. Oyster sauce. Cocktail sauce. Horseradish. Regular ketchup and mustard. Meat flavorings and tenderizers. Bouillon cubes. Hot sauce. Tabasco sauce. Marinades. Taco seasonings. Relishes. Fats and Oils Regular salad dressings. Salted butter. Margarine. Ghee. Bacon fat.  Other Potato and tortilla chips. Corn chips and puffs. Salted popcorn and pretzels. Canned or dried soups. Pizza. Frozen entrees and pot pies.  The items listed above may not be a complete list of foods and beverages to avoid. Contact your dietitian for more information. Document Released: 07/23/2001 Document Revised: 02/05/2013 Document Reviewed: 12/05/2012 [facility] Patient Information 2015 [place] [place]. This information is not intended to replace  advice given to you by your health care provider. Make sure you discuss any questions you have with your health care provider.

## 2013-08-13 NOTE — Assessment & Plan Note (Signed)
Most likely from venous insufficiency Some concern for CHF as he now props with pillows---but no other indicators of CHF  Will give information on reduced salt diet Support socks Keep feet elevated Can try 2nd furosemide if feet bad Monitor weight Consider echo if persists

## 2013-08-23 ENCOUNTER — Encounter (HOSPITAL_COMMUNITY)
Admission: RE | Admit: 2013-08-23 | Discharge: 2013-08-23 | Disposition: A | Discharge status: Home / Self Care | Payer: Medicare Other | Source: Ambulatory Visit | Attending: Nephrology | Admitting: Nephrology

## 2013-08-23 DIAGNOSIS — D649 Anemia, unspecified: Secondary | ICD-10-CM | POA: Insufficient documentation

## 2013-08-23 DIAGNOSIS — N183 Chronic kidney disease, stage 3 unspecified: Secondary | ICD-10-CM | POA: Diagnosis not present

## 2013-08-23 LAB — IRON AND TIBC
IRON: 79 ug/dL (ref 42–135)
Saturation Ratios: 35 % (ref 20–55)
TIBC: 227 ug/dL (ref 215–435)
UIBC: 148 ug/dL (ref 125–400)

## 2013-08-23 LAB — FERRITIN: FERRITIN: 1045 ng/mL — AB (ref 22–322)

## 2013-08-23 LAB — POCT HEMOGLOBIN-HEMACUE: HEMOGLOBIN: 9.9 g/dL — AB (ref 13.0–17.0)

## 2013-08-23 MED ORDER — EPOETIN ALFA 20000 UNIT/ML IJ SOLN: 20000.0000 [IU] | INTRAMUSCULAR | Status: DC

## 2013-08-23 MED ORDER — EPOETIN ALFA 20000 UNIT/ML IJ SOLN
INTRAMUSCULAR | Status: AC
  Administered 2013-08-23: 20000 [IU] via SUBCUTANEOUS
  Filled 2013-08-23: qty 1

## 2013-08-31 ENCOUNTER — Other Ambulatory Visit: Payer: Self-pay | Admitting: Cardiology

## 2013-09-02 NOTE — Telephone Encounter (Signed)
clopidogrel (PLAVIX) 75 MG tablet  TAKE 1 TABLET (75 MG TOTAL) BY MOUTH DAILY.   90 tablet   0    Your physician recommends that you continue on your current medications as directed. Please refer to the Current Medication list given to you today.  Luis AbedJeffrey D Katz, MD at 07/17/2013 10:24 AM

## 2013-09-05 ENCOUNTER — Other Ambulatory Visit: Payer: Self-pay | Admitting: Cardiology

## 2013-09-05 ENCOUNTER — Telehealth: Payer: Self-pay

## 2013-09-05 NOTE — Telephone Encounter (Signed)
[facility],Echo,stress test and office notes. Pt scheduled for surgery on 09/11/13. Zella BallRobin will contact Dr Myrtis SerKatz office at [phone] for information. Spoke with Lupita Leashonna at cardiology and Elease Hashimotoatricia Via is Dr Myrtis SerKatz nurse; [facility]

## 2013-09-11 HISTORY — PX: EYE SURGERY: SHX253

## 2013-09-12 NOTE — Telephone Encounter (Signed)
Can you tell me what happened with this?

## 2013-09-12 NOTE — Telephone Encounter (Signed)
**Note De-Identified  Obfuscation** It looks like [facility] ([facility] who then sent us this message. I tried to contact Zella BallRobin but did not have enough information from phone note to contact her and she has not contacted us that I am aware of.

## 2013-09-13 ENCOUNTER — Encounter (HOSPITAL_COMMUNITY): Payer: Medicare Other

## 2013-09-21 ENCOUNTER — Other Ambulatory Visit: Payer: Self-pay | Admitting: Internal Medicine

## 2013-09-25 ENCOUNTER — Other Ambulatory Visit: Payer: Self-pay | Admitting: Internal Medicine

## 2013-09-27 ENCOUNTER — Encounter (HOSPITAL_COMMUNITY)
Admission: RE | Admit: 2013-09-27 | Discharge: 2013-09-27 | Disposition: A | Discharge status: Home / Self Care | Payer: Medicare Other | Source: Ambulatory Visit | Attending: Nephrology | Admitting: Nephrology

## 2013-09-27 DIAGNOSIS — D649 Anemia, unspecified: Secondary | ICD-10-CM | POA: Insufficient documentation

## 2013-09-27 DIAGNOSIS — N183 Chronic kidney disease, stage 3 unspecified: Secondary | ICD-10-CM | POA: Diagnosis not present

## 2013-09-27 MED ORDER — EPOETIN ALFA 20000 UNIT/ML IJ SOLN
INTRAMUSCULAR | Status: AC
  Administered 2013-09-27: 20000 [IU] via SUBCUTANEOUS
  Filled 2013-09-27: qty 1

## 2013-09-27 MED ORDER — EPOETIN ALFA 10000 UNIT/ML IJ SOLN
INTRAMUSCULAR | Status: AC
  Filled 2013-09-27: qty 1

## 2013-09-27 MED ORDER — EPOETIN ALFA 20000 UNIT/ML IJ SOLN
20000.0000 [IU] | INTRAMUSCULAR | Status: DC
  Administered 2013-09-27: 20000 [IU] via SUBCUTANEOUS

## 2013-10-01 LAB — POCT HEMOGLOBIN-HEMACUE: Hemoglobin: 9.9 g/dL — ABNORMAL LOW (ref 13.0–17.0)

## 2013-10-18 ENCOUNTER — Encounter (HOSPITAL_COMMUNITY)
Admission: RE | Admit: 2013-10-18 | Discharge: 2013-10-18 | Disposition: A | Discharge status: Home / Self Care | Payer: Medicare Other | Source: Ambulatory Visit | Attending: Nephrology | Admitting: Nephrology

## 2013-10-18 DIAGNOSIS — N183 Chronic kidney disease, stage 3 unspecified: Secondary | ICD-10-CM | POA: Diagnosis not present

## 2013-10-18 DIAGNOSIS — D649 Anemia, unspecified: Secondary | ICD-10-CM | POA: Diagnosis present

## 2013-10-18 LAB — POCT HEMOGLOBIN-HEMACUE: Hemoglobin: 10.4 g/dL — ABNORMAL LOW (ref 13.0–17.0)

## 2013-10-18 MED ORDER — EPOETIN ALFA 20000 UNIT/ML IJ SOLN
INTRAMUSCULAR | Status: AC
  Administered 2013-10-18: 20000 [IU] via SUBCUTANEOUS
  Filled 2013-10-18: qty 1

## 2013-10-18 MED ORDER — EPOETIN ALFA 20000 UNIT/ML IJ SOLN: 20000.0000 [IU] | INTRAMUSCULAR | Status: DC

## 2013-10-23 ENCOUNTER — Encounter: Payer: Self-pay | Admitting: Internal Medicine

## 2013-10-23 ENCOUNTER — Ambulatory Visit (INDEPENDENT_AMBULATORY_CARE_PROVIDER_SITE_OTHER): Payer: Medicare Other | Admitting: Internal Medicine

## 2013-10-23 VITALS — BP 120/60 | HR 58 | Temp 98.1°F | Wt 136.0 lb

## 2013-10-23 DIAGNOSIS — I10 Essential (primary) hypertension: Secondary | ICD-10-CM

## 2013-10-23 DIAGNOSIS — R609 Edema, unspecified: Secondary | ICD-10-CM

## 2013-10-23 DIAGNOSIS — G3184 Mild cognitive impairment, so stated: Secondary | ICD-10-CM

## 2013-10-23 DIAGNOSIS — D638 Anemia in other chronic diseases classified elsewhere: Secondary | ICD-10-CM

## 2013-10-23 DIAGNOSIS — N183 Chronic kidney disease, stage 3 unspecified: Secondary | ICD-10-CM

## 2013-10-23 NOTE — Assessment & Plan Note (Signed)
BP Readings from Last 3 Encounters:  10/23/13 120/60  10/18/13 149/61  09/27/13 147/52   Good control now No changes

## 2013-10-23 NOTE — Assessment & Plan Note (Signed)
Checked every 3 weeks Gets shots regularly still Hemodynamically stable--probably shouldn't get Rx if hgb over 9

## 2013-10-23 NOTE — Assessment & Plan Note (Signed)
Better No evidence of CHF If worsens, could consider cutting back on amlodipine

## 2013-10-23 NOTE — Progress Notes (Signed)
Subjective:    Patient ID: Anthony Werner, male    DOB: 12/18/1924, 80 y.o.   MRN: [id number]  HPI Here with wife Has done well  Edema is better Weight back down Once or twice the swelling got worse---okay with extra furosemide  Does wear support hose at times  No chest pain Gets DOE with fast walking or mowing lawn-- no change though (has given up the lawn due to this) Uses 2 pillows--no change. No PND No dizziness or syncope  Memory still not great Forgets things wife has said a few minutes earlier Does repeat himself at times--but mostly wife has to repeat things to him Not getting lost in car--still drives Hasn't given up any cognitive tasks  Current Outpatient Prescriptions on File Prior to Visit  Medication Sig Dispense Refill  . amLODipine (NORVASC) 10 MG tablet Take 10 mg by mouth daily.      Marland Kitchen aspirin 81 MG tablet Take 81 mg by mouth daily.        Marland Kitchen b complex vitamins capsule Take 1 capsule by mouth daily. Plus  C      . carvedilol (COREG) 12.5 MG tablet TAKE 1 TABLET BY MOUTH TWICE A DAY WITH A MEAL  180 tablet  4  . clopidogrel (PLAVIX) 75 MG tablet TAKE 1 TABLET (75 MG TOTAL) BY MOUTH DAILY.  90 tablet  2  . famciclovir (FAMVIR) 500 MG tablet Take 500 mg by mouth daily.       . furosemide (LASIX) 40 MG tablet Take 1 tablet (40 mg total) by mouth daily.  180 tablet  3  . isosorbide mononitrate (IMDUR) 30 MG 24 hr tablet TAKE 1 TABLET BY MOUTH DAILY.  30 tablet  11  . lisinopril (PRINIVIL,ZESTRIL) 20 MG tablet TAKE 1 TABLET BY MOUTH EVERY DAY  90 tablet  3  . omeprazole (PRILOSEC) 20 MG capsule TAKE ONE CAPSULE BY MOUTH TWICE A DAY  180 capsule  3  . pravastatin (PRAVACHOL) 40 MG tablet TAKE 1 TABLET (40 MG TOTAL) BY MOUTH DAILY.  90 tablet  3  . prednisoLONE acetate (PRED FORTE) 1 % ophthalmic suspension Place 1 drop into the right eye 2 (two) times daily.       Marland Kitchen VITAMIN D, CHOLECALCIFEROL, PO Take 1 capsule by mouth daily.        Current Facility-Administered  Medications on File Prior to Visit  Medication Dose Route Frequency Provider Last Rate Last Dose  . ferumoxytol [facility]) injection 510 mg  510 mg Intravenous Once Dyke Maes, MD        Allergies  Allergen Reactions  . Atenolol   . Diltiazem   . Hydrochlorothiazide   . Labetalol   . Lovastatin   . Niacin   . Simvastatin   . Terazosin   . Iron     Just had constipation    Past Medical History  Diagnosis Date  . Hypertension   . Hyperlipidemia   . Anemia of chronic disease 04/2009    hgb 8...transfusion 04/2009  . CHF (congestive heart failure) 04/07/09    CHF while in hospital February, 2011, EF 45%  . Esophageal stricture     Dilatation and VA, / dilatation later by Dr. Arlyce Dice  . CKD (chronic kidney disease), stage II   . Carotid artery disease     Total right carotid, 60-79% LICA Dr. Edilia Bo, July, 2011 / left carotid endarterectomy January, 2012     . Community acquired pneumonia   .  GERD (gastroesophageal reflux disease)   . Gout   . BPH (benign prostatic hyperplasia)   . Impaired fasting glucose   . Ejection fraction     45% by history  . Chest pain     Nuclear, April, 2011, no scar or ischemia    Past Surgical History  Procedure Laterality Date  . Cataract extraction    . Hernia repair    . Tonsillectomy    . Esophageal dilation      x3  . Carotid endarterectomy  LEFT    03/10/2010  . Iron injections  Sept. 2013 , Aug. 2014    Every 3 weeks    Family History  Problem Relation Age of Onset  . Cancer Father     Prostate  . Hypertension Father   . Heart disease Father     Heart Disease before age 3  . Heart disease Brother     x2  . Hypertension Brother   . Hypertension Mother   . Hypertension Sister   . Hypertension Daughter     History   Social History  . Marital Status: Married    Spouse Name: N/A    Number of Children: 3  . Years of Education: N/A   Occupational History  . retired, Doctor, hospital    Social History Main  Topics  . Smoking status: Former Smoker    Types: Cigarettes    Quit date: 02/15/1967  . Smokeless tobacco: Never Used  . Alcohol Use: No  . Drug Use: No  . Sexual Activity: Not on file   Other Topics Concern  . Not on file   Social History Narrative   Has living will    Wife, then oldest daughter to make health care decisions Luster Landsberg)    Will accept resuscitation but no prolonged mechanical ventilation   Not sure about tube feeds, might accept   Review of Systems Sleeps well Appetite is good    Objective:   Physical Exam  Constitutional: He appears well-developed and well-nourished. No distress.  Neck: Normal range of motion. Neck supple. No thyromegaly present.  Cardiovascular: Normal rate, regular rhythm and normal heart sounds.  Exam reveals no gallop.   No murmur heard. Pulmonary/Chest: Effort normal and breath sounds normal. No respiratory distress. He has no wheezes. He has no rales.  Musculoskeletal: He exhibits no edema and no tenderness.  Lymphadenopathy:    He has no cervical adenopathy.  Psychiatric: He has a normal mood and affect. His behavior is normal.          Assessment & Plan:

## 2013-10-23 NOTE — Progress Notes (Signed)
Pre visit review using our clinic review tool, if applicable. No additional management support is needed unless otherwise documented below in the visit note. 

## 2013-10-23 NOTE — Assessment & Plan Note (Signed)
At most mild progression but no functional decline (at least not on cognitive basis) No Rx

## 2013-10-23 NOTE — Assessment & Plan Note (Signed)
Is on ACEI Labs next time

## 2013-10-30 ENCOUNTER — Other Ambulatory Visit (HOSPITAL_COMMUNITY): Payer: Medicare Other

## 2013-10-30 ENCOUNTER — Ambulatory Visit: Payer: Medicare Other | Admitting: Family

## 2013-11-01 ENCOUNTER — Other Ambulatory Visit: Payer: Self-pay | Admitting: Cardiology

## 2013-11-05 ENCOUNTER — Encounter: Payer: Self-pay | Admitting: Vascular Surgery

## 2013-11-06 ENCOUNTER — Ambulatory Visit (HOSPITAL_COMMUNITY)
Admission: RE | Admit: 2013-11-06 | Discharge: 2013-11-06 | Disposition: A | Discharge status: Home / Self Care | Payer: Medicare Other | Source: Ambulatory Visit | Attending: Family | Admitting: Family

## 2013-11-06 ENCOUNTER — Encounter: Payer: Self-pay | Admitting: Family

## 2013-11-06 ENCOUNTER — Ambulatory Visit (INDEPENDENT_AMBULATORY_CARE_PROVIDER_SITE_OTHER): Payer: Medicare Other | Admitting: Family

## 2013-11-06 VITALS — BP 169/69 | HR 59 | Resp 16 | Ht 67.0 in | Wt 138.0 lb

## 2013-11-06 DIAGNOSIS — Z48812 Encounter for surgical aftercare following surgery on the circulatory system: Secondary | ICD-10-CM | POA: Diagnosis not present

## 2013-11-06 DIAGNOSIS — I6529 Occlusion and stenosis of unspecified carotid artery: Secondary | ICD-10-CM | POA: Insufficient documentation

## 2013-11-06 DIAGNOSIS — M7989 Other specified soft tissue disorders: Secondary | ICD-10-CM | POA: Insufficient documentation

## 2013-11-06 DIAGNOSIS — R0602 Shortness of breath: Secondary | ICD-10-CM | POA: Insufficient documentation

## 2013-11-06 NOTE — Patient Instructions (Signed)
Stroke Prevention Some medical conditions and behaviors are associated with an increased chance of having a stroke. You may prevent a stroke by making healthy choices and managing medical conditions. HOW CAN I REDUCE MY RISK OF HAVING A STROKE?   Stay physically active. Get at least 30 minutes of activity on most or all days.  Do not smoke. It may also be helpful to avoid exposure to secondhand smoke.  Limit alcohol use. Moderate alcohol use is considered to be:  No more than 2 drinks per day for men.  No more than 1 drink per day for nonpregnant women.  Eat healthy foods. This involves:  Eating 5 or more servings of fruits and vegetables a day.  Making dietary changes that address high blood pressure (hypertension), high cholesterol, diabetes, or obesity.  Manage your cholesterol levels.  Making food choices that are high in fiber and low in saturated fat, trans fat, and cholesterol may control cholesterol levels.  Take any prescribed medicines to control cholesterol as directed by your health care provider.  Manage your diabetes.  Controlling your carbohydrate and sugar intake is recommended to manage diabetes.  Take any prescribed medicines to control diabetes as directed by your health care provider.  Control your hypertension.  Making food choices that are low in salt (sodium), saturated fat, trans fat, and cholesterol is recommended to manage hypertension.  Take any prescribed medicines to control hypertension as directed by your health care provider.  Maintain a healthy weight.  Reducing calorie intake and making food choices that are low in sodium, saturated fat, trans fat, and cholesterol are recommended to manage weight.  Stop drug abuse.  Avoid taking birth control pills.  Talk to your health care provider about the risks of taking birth control pills if you are over 35 years old, smoke, get migraines, or have ever had a blood clot.  Get evaluated for sleep  disorders (sleep apnea).  Talk to your health care provider about getting a sleep evaluation if you snore a lot or have excessive sleepiness.  Take medicines only as directed by your health care provider.  For some people, aspirin or blood thinners (anticoagulants) are helpful in reducing the risk of forming abnormal blood clots that can lead to stroke. If you have the irregular heart rhythm of atrial fibrillation, you should be on a blood thinner unless there is a good reason you cannot take them.  Understand all your medicine instructions.  Make sure that other conditions (such as anemia or atherosclerosis) are addressed. SEEK IMMEDIATE MEDICAL CARE IF:   You have sudden weakness or numbness of the face, arm, or leg, especially on one side of the body.  Your face or eyelid droops to one side.  You have sudden confusion.  You have trouble speaking (aphasia) or understanding.  You have sudden trouble seeing in one or both eyes.  You have sudden trouble walking.  You have dizziness.  You have a loss of balance or coordination.  You have a sudden, severe headache with no known cause.  You have new chest pain or an irregular heartbeat. Any of these symptoms may represent a serious problem that is an emergency. Do not wait to see if the symptoms will go away. Get medical help at once. Call your local emergency services (911 in [place] Do not drive yourself to the hospital. Document Released: 03/10/2004 Document Revised: 06/17/2013 Document Reviewed: 08/03/2012 Exit[employer] Patient Information 2015 [employer]. This information is not intended to replace advice given   to you by your health care provider. Make sure you discuss any questions you have with your health care provider.  

## 2013-11-06 NOTE — Progress Notes (Signed)
Established Carotid Patient   History of Present Illness  Anthony Werner is a 80 y.o. male who had a left carotid endarterectomy in 2012 by Dr. Edilia Bo. Carotid Duplex a year ago showed no evidence of significant recurrent carotid stenosis on the left side. He has a known right internal carotid artery occlusion.  He returns today for Duplex carotid artery surveillance.  Patient denies ever having stroke or TIA's before or after his CEA.  Specifically he denies amaurosis fugax or monocular blindness, denies unilateral facial drooping, denies hemiplegia, denies receptive or expressive aphasia.   He denies claudication symptoms.  Patient reports New Medical or Surgical History: right eye surgery, having to do with the vitreous.   PMHx is significant for CHF which has resolved, he denies history of MI.   Pt Diabetic: No  Pt smoker: former smoker, quit 1979, smoked for 20 years   Pt meds include:  Statin : Yes  Betablocker: Yes  ASA: Yes  Other anticoagulants/antiplatelets: Plavix    Past Medical History  Diagnosis Date  . Hypertension   . Hyperlipidemia   . Anemia of chronic disease 04/2009    hgb 8...transfusion 04/2009  . CHF (congestive heart failure) 04/07/09    CHF while in hospital February, 2011, EF 45%  . Esophageal stricture     Dilatation and VA, / dilatation later by Dr. Arlyce Dice  . CKD (chronic kidney disease), stage II   . Carotid artery disease     Total right carotid, 60-79% LICA Dr. Edilia Bo, July, 2011 / left carotid endarterectomy January, 2012     . Community acquired pneumonia   . GERD (gastroesophageal reflux disease)   . Gout   . BPH (benign prostatic hyperplasia)   . Impaired fasting glucose   . Ejection fraction     45% by history  . Chest pain     Nuclear, April, 2011, no scar or ischemia    Social History History  Substance Use Topics  . Smoking status: Former Smoker    Types: Cigarettes    Quit date: 02/15/1967  . Smokeless tobacco: Never Used   . Alcohol Use: No    Family History Family History  Problem Relation Age of Onset  . Cancer Father     Prostate  . Hypertension Father   . Heart disease Father     Heart Disease before age 29  . Heart disease Brother     x2  . Hypertension Brother   . Heart attack Brother   . Hypertension Mother   . Hypertension Sister   . Peripheral vascular disease Sister   . Hypertension Daughter     Surgical History Past Surgical History  Procedure Laterality Date  . Cataract extraction  2008    Right  and   Left same year  . Hernia repair  2007  . Tonsillectomy    . Esophageal dilation      x3  . Carotid endarterectomy  LEFT    03/10/2010  . Iron injections  Sept. 2013 , Aug. 2014    Every 3 weeks  . Eye surgery Right 09-11-13    Vetroretinal " Laser "    Allergies  Allergen Reactions  . Atenolol   . Diltiazem   . Hydrochlorothiazide   . Labetalol   . Lovastatin   . Niacin   . Simvastatin   . Terazosin   . Iron     Just had constipation    Current Outpatient Prescriptions  Medication Sig Dispense Refill  .  amLODipine (NORVASC) 10 MG tablet Take 10 mg by mouth daily.      Marland Kitchen aspirin 81 MG tablet Take 81 mg by mouth daily.        Marland Kitchen b complex vitamins capsule Take 1 capsule by mouth daily. Plus  C      . calcium carbonate (OS-CAL) 600 MG TABS tablet Take 600 mg by mouth 2 (two) times daily with a meal.      . carvedilol (COREG) 12.5 MG tablet TAKE 1 TABLET BY MOUTH TWICE A DAY WITH A MEAL  180 tablet  4  . clopidogrel (PLAVIX) 75 MG tablet TAKE 1 TABLET (75 MG TOTAL) BY MOUTH DAILY.  90 tablet  2  . famciclovir (FAMVIR) 500 MG tablet Take 500 mg by mouth daily.       . furosemide (LASIX) 40 MG tablet Take 1 tablet (40 mg total) by mouth daily.  180 tablet  3  . isosorbide mononitrate (IMDUR) 30 MG 24 hr tablet TAKE 1 TABLET BY MOUTH DAILY.  30 tablet  11  . lisinopril (PRINIVIL,ZESTRIL) 20 MG tablet TAKE 1 TABLET BY MOUTH EVERY DAY  90 tablet  1  . omeprazole  (PRILOSEC) 20 MG capsule TAKE ONE CAPSULE BY MOUTH TWICE A DAY  180 capsule  3  . pravastatin (PRAVACHOL) 40 MG tablet TAKE 1 TABLET (40 MG TOTAL) BY MOUTH DAILY.  90 tablet  3  . prednisoLONE acetate (PRED FORTE) 1 % ophthalmic suspension Place 1 drop into the right eye 2 (two) times daily.       Marland Kitchen VITAMIN D, CHOLECALCIFEROL, PO Take 1 capsule by mouth daily.        No current facility-administered medications for this visit.   Facility-Administered Medications Ordered in Other Visits  Medication Dose Route Frequency Provider Last Rate Last Dose  . ferumoxytol [facility]) injection 510 mg  510 mg Intravenous Once Dyke Maes, MD        Review of Systems : See HPI for pertinent positives and negatives.  Physical Examination   Filed Vitals:   11/06/13 1425 11/06/13 1429  BP: 159/74 169/69  Pulse: 59 59  Resp:  16  Height:   (1.702 m)  Weight:  138 lb (62.596 kg)  SpO2:  100%   Body mass index is 21.61 kg/(m^2).  General: WDWN male in NAD  GAIT: normal  Eyes: PERRLA  Pulmonary: CTAB, Negative Rales, Negative rhonchi, & Negative wheezing.  Cardiac: regular Rhythm , Negative Murmur detected.   VASCULAR EXAM  Carotid Bruits  Left  Right    Negative  Negative    Radial pulses are 2+ palpable and equal. Aorta is not palpable.  LE Pulses  LEFT  RIGHT   FEMORAL  palpable  palpable   POPLITEAL  not palpable  not palpable   POSTERIOR TIBIAL  not palpable  not palpable   DORSALIS PEDIS  ANTERIOR TIBIAL  palpable  palpable    Gastrointestinal: soft, nontender, BS WNL, no r/g, negative palpated mass.  Musculoskeletal: Negative muscle atrophy/wasting. M/S 5/5 throughout, Extremities without ischemic changes.  Neurologic: A&O X 3; Appropriate Affect ; SENSATION ;normal;  Speech is normal  CN 2-12 intact, Pain and light touch intact in extremities, Motor exam as listed above.  Non-Invasive Vascular Imaging CAROTID DUPLEX 11/06/2013   CEREBROVASCULAR DUPLEX  EVALUATION    INDICATION: Carotid disease     PREVIOUS INTERVENTION(S): Known occlusion of right internal carotid artery. Left carotid endarterectomy 03/09/2010    DUPLEX EXAM:  RIGHT  LEFT  Peak Systolic Velocities (cm/s) End Diastolic Velocities (cm/s) Plaque LOCATION Peak Systolic Velocities (cm/s) End Diastolic Velocities (cm/s) Plaque  48 0 HM CCA PROXIMAL 111 15   59 0 HM CCA MID 115 20 HT  56 0 HM CCA DISTAL 115 17   342 0 CP ECA 128 0   0 0 HT ICA PROXIMAL 104 17   0 0 HT ICA MID 134 28   0 0 HT ICA DISTAL 89 23     Occluded ICA / CCA Ratio (PSV) NA  Antegrade  Vertebral Flow Antegrade    Brachial Systolic Pressure (mmHg)   Within normal limits  Brachial Artery Waveforms Within normal limits     Plaque Morphology:  HM = Homogeneous, HT = Heterogeneous, CP = Calcific Plaque, SP = Smooth Plaque, IP = Irregular Plaque     ADDITIONAL FINDINGS:     IMPRESSION: 1. Confirmed occlusion of the right internal carotid artery. 2. Significant stenosis of the right external carotid artery. 3. Widely patent left carotid endarterectomy without evidence of restenosis or hyperplasia.  4. Bilateral vertebral artery is antegrade.    Compared to the previous exam:  No significant change compared to prior exam.       Assessment: Anthony Werner is a 80 y.o. male who is s/p  left carotid endarterectomy in 2012 by Dr. Edilia Bo. He has a known right internal carotid artery occlusion.  Patient denies ever having stroke or TIA's before or after his CEA.  He presents with asymptomatic confirmed occlusion of the right internal carotid artery and widely patent left carotid endarterectomy without evidence of restenosis or hyperplasia.  Plan: Follow-up in 1 year with Carotid Duplex scan.   I discussed in depth with the patient the nature of atherosclerosis, and emphasized the importance of maximal medical management including strict control of blood pressure, blood glucose, and lipid levels,  obtaining regular exercise, and continued cessation of smoking.  The patient is aware that without maximal medical management the underlying atherosclerotic disease process will progress, limiting the benefit of any interventions. The patient was given information about stroke prevention and what symptoms should prompt the patient to seek immediate medical care. Thank you for allowing [place] to participate in this patient's care.  Charisse March, RN, MSN, FNP-C [facility] Office: [phone]  Clinic Physician: Edilia Bo  11/06/2013 2:30 PM

## 2013-11-08 ENCOUNTER — Encounter (HOSPITAL_COMMUNITY)
Admission: RE | Admit: 2013-11-08 | Discharge: 2013-11-08 | Disposition: A | Discharge status: Home / Self Care | Payer: Medicare Other | Source: Ambulatory Visit | Attending: Nephrology | Admitting: Nephrology

## 2013-11-08 DIAGNOSIS — D649 Anemia, unspecified: Secondary | ICD-10-CM | POA: Diagnosis not present

## 2013-11-08 LAB — POCT HEMOGLOBIN-HEMACUE: Hemoglobin: 10 g/dL — ABNORMAL LOW (ref 13.0–17.0)

## 2013-11-08 MED ORDER — EPOETIN ALFA 20000 UNIT/ML IJ SOLN
INTRAMUSCULAR | Status: AC
  Filled 2013-11-08: qty 1

## 2013-11-08 MED ORDER — EPOETIN ALFA 20000 UNIT/ML IJ SOLN
20000.0000 [IU] | INTRAMUSCULAR | Status: DC
  Administered 2013-11-08: 20000 [IU] via SUBCUTANEOUS

## 2013-11-29 ENCOUNTER — Encounter (HOSPITAL_COMMUNITY)
Admission: RE | Admit: 2013-11-29 | Discharge: 2013-11-29 | Disposition: A | Discharge status: Home / Self Care | Payer: Medicare Other | Source: Ambulatory Visit | Attending: Nephrology | Admitting: Nephrology

## 2013-11-29 DIAGNOSIS — D631 Anemia in chronic kidney disease: Secondary | ICD-10-CM | POA: Insufficient documentation

## 2013-11-29 DIAGNOSIS — N183 Chronic kidney disease, stage 3 (moderate): Secondary | ICD-10-CM | POA: Insufficient documentation

## 2013-11-29 LAB — IRON AND TIBC
Iron: 93 ug/dL (ref 42–135)
SATURATION RATIOS: 42 % (ref 20–55)
TIBC: 220 ug/dL (ref 215–435)
UIBC: 127 ug/dL (ref 125–400)

## 2013-11-29 LAB — POCT HEMOGLOBIN-HEMACUE: HEMOGLOBIN: 10.2 g/dL — AB (ref 13.0–17.0)

## 2013-11-29 LAB — FERRITIN: FERRITIN: 1299 ng/mL — AB (ref 22–322)

## 2013-11-29 MED ORDER — EPOETIN ALFA 20000 UNIT/ML IJ SOLN
20000.0000 [IU] | INTRAMUSCULAR | Status: DC
  Administered 2013-11-29: 20000 [IU] via SUBCUTANEOUS

## 2013-11-29 MED ORDER — EPOETIN ALFA 20000 UNIT/ML IJ SOLN
INTRAMUSCULAR | Status: AC
  Administered 2013-11-29: 20000 [IU] via SUBCUTANEOUS
  Filled 2013-11-29: qty 1

## 2013-12-19 ENCOUNTER — Other Ambulatory Visit (HOSPITAL_COMMUNITY): Payer: Self-pay | Admitting: *Deleted

## 2013-12-20 ENCOUNTER — Encounter (HOSPITAL_COMMUNITY)
Admission: RE | Admit: 2013-12-20 | Discharge: 2013-12-20 | Disposition: A | Discharge status: Home / Self Care | Payer: Medicare Other | Source: Ambulatory Visit | Attending: Nephrology | Admitting: Nephrology

## 2013-12-20 DIAGNOSIS — N183 Chronic kidney disease, stage 3 (moderate): Secondary | ICD-10-CM | POA: Diagnosis not present

## 2013-12-20 DIAGNOSIS — D631 Anemia in chronic kidney disease: Secondary | ICD-10-CM | POA: Diagnosis present

## 2013-12-20 LAB — POCT HEMOGLOBIN-HEMACUE: Hemoglobin: 10 g/dL — ABNORMAL LOW (ref 13.0–17.0)

## 2013-12-20 MED ORDER — EPOETIN ALFA 20000 UNIT/ML IJ SOLN
20000.0000 [IU] | INTRAMUSCULAR | Status: DC
  Administered 2013-12-20: 20000 [IU] via SUBCUTANEOUS

## 2013-12-20 MED ORDER — EPOETIN ALFA 20000 UNIT/ML IJ SOLN
INTRAMUSCULAR | Status: AC
  Filled 2013-12-20: qty 1

## 2014-01-08 ENCOUNTER — Encounter (HOSPITAL_COMMUNITY)
Admission: RE | Admit: 2014-01-08 | Discharge: 2014-01-08 | Disposition: A | Discharge status: Home / Self Care | Payer: Medicare Other | Source: Ambulatory Visit | Attending: Nephrology | Admitting: Nephrology

## 2014-01-08 DIAGNOSIS — D631 Anemia in chronic kidney disease: Secondary | ICD-10-CM | POA: Diagnosis not present

## 2014-01-08 LAB — IRON AND TIBC
Iron: 32 ug/dL — ABNORMAL LOW (ref 42–135)
Saturation Ratios: 16 % — ABNORMAL LOW (ref 20–55)
TIBC: 202 ug/dL — ABNORMAL LOW (ref 215–435)
UIBC: 170 ug/dL (ref 125–400)

## 2014-01-08 LAB — POCT HEMOGLOBIN-HEMACUE: HEMOGLOBIN: 10.7 g/dL — AB (ref 13.0–17.0)

## 2014-01-08 LAB — FERRITIN: Ferritin: 1250 ng/mL — ABNORMAL HIGH (ref 22–322)

## 2014-01-08 MED ORDER — EPOETIN ALFA 20000 UNIT/ML IJ SOLN
INTRAMUSCULAR | Status: AC
  Filled 2014-01-08: qty 1

## 2014-01-08 MED ORDER — EPOETIN ALFA 20000 UNIT/ML IJ SOLN
20000.0000 [IU] | INTRAMUSCULAR | Status: DC
  Administered 2014-01-08: 20000 [IU] via SUBCUTANEOUS

## 2014-01-29 ENCOUNTER — Encounter (HOSPITAL_COMMUNITY)
Admission: RE | Admit: 2014-01-29 | Discharge: 2014-01-29 | Disposition: A | Discharge status: Home / Self Care | Payer: Medicare Other | Source: Ambulatory Visit | Attending: Nephrology | Admitting: Nephrology

## 2014-01-29 DIAGNOSIS — N183 Chronic kidney disease, stage 3 (moderate): Secondary | ICD-10-CM | POA: Insufficient documentation

## 2014-01-29 DIAGNOSIS — D631 Anemia in chronic kidney disease: Secondary | ICD-10-CM | POA: Insufficient documentation

## 2014-01-29 LAB — POCT HEMOGLOBIN-HEMACUE: HEMOGLOBIN: 10.4 g/dL — AB (ref 13.0–17.0)

## 2014-01-29 MED ORDER — EPOETIN ALFA 20000 UNIT/ML IJ SOLN
INTRAMUSCULAR | Status: AC
Start: 2014-01-29 — End: 2014-01-29
  Filled 2014-01-29: qty 1

## 2014-01-29 MED ORDER — EPOETIN ALFA 20000 UNIT/ML IJ SOLN
20000.0000 [IU] | INTRAMUSCULAR | Status: DC
  Administered 2014-01-29: 20000 [IU] via SUBCUTANEOUS

## 2014-02-19 ENCOUNTER — Encounter (HOSPITAL_COMMUNITY)
Admission: RE | Admit: 2014-02-19 | Discharge: 2014-02-19 | Disposition: A | Discharge status: Home / Self Care | Payer: Medicare Other | Source: Ambulatory Visit | Attending: Nephrology | Admitting: Nephrology

## 2014-02-19 DIAGNOSIS — D631 Anemia in chronic kidney disease: Secondary | ICD-10-CM | POA: Insufficient documentation

## 2014-02-19 DIAGNOSIS — N183 Chronic kidney disease, stage 3 (moderate): Secondary | ICD-10-CM | POA: Insufficient documentation

## 2014-02-19 LAB — POCT HEMOGLOBIN-HEMACUE: Hemoglobin: 10.9 g/dL — ABNORMAL LOW (ref 13.0–17.0)

## 2014-02-19 MED ORDER — EPOETIN ALFA 20000 UNIT/ML IJ SOLN
INTRAMUSCULAR | Status: AC
  Filled 2014-02-19: qty 1

## 2014-02-19 MED ORDER — EPOETIN ALFA 20000 UNIT/ML IJ SOLN
20000.0000 [IU] | INTRAMUSCULAR | Status: DC
  Administered 2014-02-19: 20000 [IU] via SUBCUTANEOUS

## 2014-03-12 ENCOUNTER — Encounter (HOSPITAL_COMMUNITY)
Admission: RE | Admit: 2014-03-12 | Discharge: 2014-03-12 | Disposition: A | Discharge status: Home / Self Care | Payer: Medicare Other | Source: Ambulatory Visit | Attending: Nephrology | Admitting: Nephrology

## 2014-03-12 DIAGNOSIS — D631 Anemia in chronic kidney disease: Secondary | ICD-10-CM | POA: Insufficient documentation

## 2014-03-12 DIAGNOSIS — N183 Chronic kidney disease, stage 3 (moderate): Secondary | ICD-10-CM | POA: Insufficient documentation

## 2014-03-12 DIAGNOSIS — Z5181 Encounter for therapeutic drug level monitoring: Secondary | ICD-10-CM | POA: Diagnosis not present

## 2014-03-12 LAB — POCT HEMOGLOBIN-HEMACUE: Hemoglobin: 10.6 g/dL — ABNORMAL LOW (ref 13.0–17.0)

## 2014-03-12 MED ORDER — EPOETIN ALFA 20000 UNIT/ML IJ SOLN
INTRAMUSCULAR | Status: AC
  Filled 2014-03-12: qty 1

## 2014-03-12 MED ORDER — EPOETIN ALFA 20000 UNIT/ML IJ SOLN
20000.0000 [IU] | INTRAMUSCULAR | Status: DC
  Administered 2014-03-12: 20000 [IU] via SUBCUTANEOUS

## 2014-04-02 ENCOUNTER — Encounter (HOSPITAL_COMMUNITY)
Admission: RE | Admit: 2014-04-02 | Discharge: 2014-04-02 | Disposition: A | Discharge status: Home / Self Care | Payer: Medicare Other | Source: Ambulatory Visit | Attending: Nephrology | Admitting: Nephrology

## 2014-04-02 DIAGNOSIS — D631 Anemia in chronic kidney disease: Secondary | ICD-10-CM | POA: Insufficient documentation

## 2014-04-02 DIAGNOSIS — N183 Chronic kidney disease, stage 3 (moderate): Secondary | ICD-10-CM | POA: Insufficient documentation

## 2014-04-02 LAB — FERRITIN: FERRITIN: 1152 ng/mL — AB (ref 22–322)

## 2014-04-02 LAB — POCT HEMOGLOBIN-HEMACUE: Hemoglobin: 11 g/dL — ABNORMAL LOW (ref 13.0–17.0)

## 2014-04-02 LAB — IRON AND TIBC
IRON: 86 ug/dL (ref 42–165)
Saturation Ratios: 38 % (ref 20–55)
TIBC: 225 ug/dL (ref 215–435)
UIBC: 139 ug/dL (ref 125–400)

## 2014-04-02 MED ORDER — EPOETIN ALFA 20000 UNIT/ML IJ SOLN
20000.0000 [IU] | INTRAMUSCULAR | Status: DC
  Administered 2014-04-02: 20000 [IU] via SUBCUTANEOUS

## 2014-04-02 MED ORDER — EPOETIN ALFA 20000 UNIT/ML IJ SOLN
INTRAMUSCULAR | Status: AC
  Administered 2014-04-02: 20000 [IU] via SUBCUTANEOUS
  Filled 2014-04-02: qty 1

## 2014-04-09 ENCOUNTER — Other Ambulatory Visit: Payer: Self-pay | Admitting: Internal Medicine

## 2014-04-09 DIAGNOSIS — E785 Hyperlipidemia, unspecified: Secondary | ICD-10-CM

## 2014-04-16 ENCOUNTER — Other Ambulatory Visit (INDEPENDENT_AMBULATORY_CARE_PROVIDER_SITE_OTHER): Payer: Medicare Other

## 2014-04-16 DIAGNOSIS — E785 Hyperlipidemia, unspecified: Secondary | ICD-10-CM

## 2014-04-16 LAB — CBC WITH DIFFERENTIAL/PLATELET
BASOS PCT: 0.6 % (ref 0.0–3.0)
Basophils Absolute: 0 10*3/uL (ref 0.0–0.1)
EOS ABS: 0.1 10*3/uL (ref 0.0–0.7)
Eosinophils Relative: 2.3 % (ref 0.0–5.0)
HCT: 34.5 % — ABNORMAL LOW (ref 39.0–52.0)
Hemoglobin: 11.6 g/dL — ABNORMAL LOW (ref 13.0–17.0)
LYMPHS PCT: 39.5 % (ref 12.0–46.0)
Lymphs Abs: 1.8 10*3/uL (ref 0.7–4.0)
MCHC: 33.7 g/dL (ref 30.0–36.0)
MCV: 91.3 fl (ref 78.0–100.0)
MONOS PCT: 7.8 % (ref 3.0–12.0)
Monocytes Absolute: 0.4 10*3/uL (ref 0.1–1.0)
NEUTROS PCT: 49.8 % (ref 43.0–77.0)
Neutro Abs: 2.3 10*3/uL (ref 1.4–7.7)
Platelets: 172 10*3/uL (ref 150.0–400.0)
RBC: 3.78 Mil/uL — AB (ref 4.22–5.81)
RDW: 16.3 % — ABNORMAL HIGH (ref 11.5–15.5)
WBC: 4.6 10*3/uL (ref 4.0–10.5)

## 2014-04-16 LAB — LIPID PANEL
Cholesterol: 167 mg/dL (ref 0–200)
HDL: 56.5 mg/dL (ref >39.00)
LDL CALC: 87 mg/dL (ref 0–99)
NONHDL: 110.5
Total CHOL/HDL Ratio: 3
Triglycerides: 119 mg/dL (ref 0.0–149.0)
VLDL: 23.8 mg/dL (ref 0.0–40.0)

## 2014-04-16 LAB — COMPREHENSIVE METABOLIC PANEL
ALT: 8 U/L (ref 0–53)
AST: 14 U/L (ref 0–37)
Albumin: 4.2 g/dL (ref 3.5–5.2)
Alkaline Phosphatase: 195 U/L — ABNORMAL HIGH (ref 39–117)
BILIRUBIN TOTAL: 0.7 mg/dL (ref 0.2–1.2)
BUN: 36 mg/dL — ABNORMAL HIGH (ref 6–23)
CO2: 30 mEq/L (ref 19–32)
CREATININE: 1.64 mg/dL — AB (ref 0.40–1.50)
Calcium: 9.8 mg/dL (ref 8.4–10.5)
Chloride: 102 mEq/L (ref 96–112)
GFR: 51.24 mL/min — AB (ref >60.00)
GLUCOSE: 109 mg/dL — AB (ref 70–99)
Potassium: 4.2 mEq/L (ref 3.5–5.1)
Sodium: 139 mEq/L (ref 135–145)
TOTAL PROTEIN: 7.5 g/dL (ref 6.0–8.3)

## 2014-04-16 LAB — T4, FREE: Free T4: 0.96 ng/dL (ref 0.60–1.60)

## 2014-04-23 ENCOUNTER — Encounter: Payer: Medicare Other | Admitting: Internal Medicine

## 2014-04-23 ENCOUNTER — Encounter (HOSPITAL_COMMUNITY)
Admission: RE | Admit: 2014-04-23 | Discharge: 2014-04-23 | Disposition: A | Discharge status: Home / Self Care | Payer: Medicare Other | Source: Ambulatory Visit | Attending: Nephrology | Admitting: Nephrology

## 2014-04-23 DIAGNOSIS — N183 Chronic kidney disease, stage 3 (moderate): Secondary | ICD-10-CM | POA: Insufficient documentation

## 2014-04-23 DIAGNOSIS — D631 Anemia in chronic kidney disease: Secondary | ICD-10-CM | POA: Diagnosis present

## 2014-04-23 LAB — POCT HEMOGLOBIN-HEMACUE: HEMOGLOBIN: 9.7 g/dL — AB (ref 13.0–17.0)

## 2014-04-23 MED ORDER — EPOETIN ALFA 20000 UNIT/ML IJ SOLN
INTRAMUSCULAR | Status: AC
  Filled 2014-04-23: qty 1

## 2014-04-23 MED ORDER — EPOETIN ALFA 20000 UNIT/ML IJ SOLN
20000.0000 [IU] | INTRAMUSCULAR | Status: DC
  Administered 2014-04-23: 20000 [IU] via SUBCUTANEOUS

## 2014-04-30 ENCOUNTER — Encounter: Payer: Self-pay | Admitting: Internal Medicine

## 2014-04-30 ENCOUNTER — Ambulatory Visit (INDEPENDENT_AMBULATORY_CARE_PROVIDER_SITE_OTHER): Payer: Medicare Other | Admitting: Internal Medicine

## 2014-04-30 VITALS — BP 130/70 | HR 62 | Temp 97.7°F | Ht 67.0 in | Wt 136.0 lb

## 2014-04-30 DIAGNOSIS — H2011 Chronic iridocyclitis, right eye: Secondary | ICD-10-CM | POA: Diagnosis not present

## 2014-04-30 DIAGNOSIS — I779 Disorder of arteries and arterioles, unspecified: Secondary | ICD-10-CM

## 2014-04-30 DIAGNOSIS — G3184 Mild cognitive impairment, so stated: Secondary | ICD-10-CM

## 2014-04-30 DIAGNOSIS — Z0000 Encounter for general adult medical examination without abnormal findings: Secondary | ICD-10-CM | POA: Diagnosis not present

## 2014-04-30 DIAGNOSIS — Z7189 Other specified counseling: Secondary | ICD-10-CM | POA: Insufficient documentation

## 2014-04-30 DIAGNOSIS — D638 Anemia in other chronic diseases classified elsewhere: Secondary | ICD-10-CM

## 2014-04-30 DIAGNOSIS — K222 Esophageal obstruction: Secondary | ICD-10-CM

## 2014-04-30 DIAGNOSIS — N183 Chronic kidney disease, stage 3 unspecified: Secondary | ICD-10-CM

## 2014-04-30 DIAGNOSIS — I739 Peripheral vascular disease, unspecified: Secondary | ICD-10-CM

## 2014-04-30 NOTE — Assessment & Plan Note (Signed)
GFR stable On ACEI and vitamin D

## 2014-04-30 NOTE — Assessment & Plan Note (Signed)
No recent symptoms Will try to cut the omeprazole to once a day

## 2014-04-30 NOTE — Assessment & Plan Note (Signed)
See social history 

## 2014-04-30 NOTE — Assessment & Plan Note (Signed)
Stable disease Continues statin and antiplatelet Rx

## 2014-04-30 NOTE — Assessment & Plan Note (Signed)
Had procedure on right eye and vision is somewhat better

## 2014-04-30 NOTE — Assessment & Plan Note (Signed)
I have personally reviewed the Medicare Annual Wellness questionnaire and have noted 1. The patient's medical and social history 2. Their use of alcohol, tobacco or illicit drugs 3. Their current medications and supplements 4. The patient's functional ability including ADL's, fall risks, home safety risks and hearing or visual             impairment. 5. Diet and physical activities 6. Evidence for depression or mood disorders  The patients weight, height, BMI and visual acuity have been recorded in the chart I have made referrals, counseling and provided education to the patient based review of the above and I have provided the pt with a written personalized care plan for preventive services.  I have provided you with a copy of your personalized plan for preventive services. Please take the time to review along with your updated medication list.  UTD on immunizations No cancer screening due to age Mild cognitive decline

## 2014-04-30 NOTE — Assessment & Plan Note (Signed)
Mild decline Doesn't seem to have sig apraxia or executive function disability No Rx for now

## 2014-04-30 NOTE — Progress Notes (Signed)
Pre visit review using our clinic review tool, if applicable. No additional management support is needed unless otherwise documented below in the visit note. 

## 2014-04-30 NOTE — Progress Notes (Signed)
Subjective:    Patient ID: Anthony Werner, male    DOB: Jan 26, 1925, 81 y.o.   MRN: [id number]  HPI Here for Medicare wellness visit and follow up of chronic medical conditions Wife is here Reviewed form and advanced directives Reviewed other physicians Did have retinal procedure last summer No alcohol or tobacco Tries to exercise regularly Had 1 fall this year-- out of chair. Mild injury on tailbone No depression or anhedonia Independent in instrumental ADLs Vision is pretty good --right eye is still not great but improved after procedure Ongoing poor hearing---not candidate for hearing aides apparently  Wife notes mild decline in memory He gets confused at times---may be related to hearing loss though Still drives--hasn't gotten lost Did give up doing his lawn --but does some pruning, etc Does repeat himself a little Trouble with names but not forgetting people Wife does feel he has some trouble with comprehension at times  Ongoing anemia Does get DOE just walking flat--has to walk slowly Getting regular erythropoietin still  Still gets regular vascular evaluations No neurologic symptoms like aphasia, focal weakness, etc  Has had a cold Noted some pressure in chest Took some cold meds and was able to cough some mucus up No change in usual breathing No fever No dizziness or syncope  Voids okay Nocturia stable at 2-3 times Only mild urgency in daytime No incontinence  No heartburn No swallowing problems Continues on the omeprazole daily  Current Outpatient Prescriptions on File Prior to Visit  Medication Sig Dispense Refill  . amLODipine (NORVASC) 10 MG tablet Take 10 mg by mouth daily.    Marland Kitchen aspirin 81 MG tablet Take 81 mg by mouth daily.      Marland Kitchen b complex vitamins capsule Take 1 capsule by mouth daily. Plus  C    . calcium carbonate (OS-CAL) 600 MG TABS tablet Take 600 mg by mouth 2 (two) times daily with a meal.    . carvedilol (COREG) 12.5 MG tablet TAKE 1  TABLET BY MOUTH TWICE A DAY WITH A MEAL 180 tablet 4  . clopidogrel (PLAVIX) 75 MG tablet TAKE 1 TABLET (75 MG TOTAL) BY MOUTH DAILY. 90 tablet 2  . furosemide (LASIX) 40 MG tablet Take 1 tablet (40 mg total) by mouth daily. 180 tablet 3  . isosorbide mononitrate (IMDUR) 30 MG 24 hr tablet TAKE 1 TABLET BY MOUTH DAILY. 30 tablet 11  . lisinopril (PRINIVIL,ZESTRIL) 20 MG tablet TAKE 1 TABLET BY MOUTH EVERY DAY 90 tablet 1  . omeprazole (PRILOSEC) 20 MG capsule TAKE ONE CAPSULE BY MOUTH TWICE A DAY 180 capsule 3  . pravastatin (PRAVACHOL) 40 MG tablet TAKE 1 TABLET (40 MG TOTAL) BY MOUTH DAILY. 90 tablet 3  . prednisoLONE acetate (PRED FORTE) 1 % ophthalmic suspension Place 1 drop into the right eye daily.     Marland Kitchen VITAMIN D, CHOLECALCIFEROL, PO Take 1 capsule by mouth daily.      Current Facility-Administered Medications on File Prior to Visit  Medication Dose Route Frequency Provider Last Rate Last Dose  . ferumoxytol [facility]) injection 510 mg  510 mg Intravenous Once Primitivo Gauze, MD        Allergies  Allergen Reactions  . Atenolol   . Diltiazem   . Hydrochlorothiazide   . Labetalol   . Lovastatin   . Niacin   . Simvastatin   . Terazosin   . Iron     Just had constipation    Past Medical History  Diagnosis Date  .  Hypertension   . Hyperlipidemia   . Anemia of chronic disease 04/2009    hgb 8...transfusion 04/2009  . CHF (congestive heart failure) 04/07/09    CHF while in hospital February, 2011, EF 45%  . Esophageal stricture     Dilatation and VA, / dilatation later by Dr. Arlyce Dice  . CKD (chronic kidney disease), stage II   . Carotid artery disease     Total right carotid, 60-79% LICA Dr. Edilia Bo, July, 2011 / left carotid endarterectomy January, 2012     . Community acquired pneumonia   . GERD (gastroesophageal reflux disease)   . Gout   . BPH (benign prostatic hyperplasia)   . Impaired fasting glucose   . Ejection fraction     45% by history  . Chest pain      Nuclear, April, 2011, no scar or ischemia    Past Surgical History  Procedure Laterality Date  . Cataract extraction  2008    Right  and   Left same year  . Hernia repair  2007  . Tonsillectomy    . Esophageal dilation      x3  . Carotid endarterectomy  LEFT    03/10/2010  . Iron injections  Sept. 2013 , Aug. 2014    Every 3 weeks  . Eye surgery Right 09-11-13    Vetroretinal " Laser "    Family History  Problem Relation Age of Onset  . Cancer Father     Prostate  . Hypertension Father   . Heart disease Father     Heart Disease before age 57  . Heart disease Brother     x2  . Hypertension Brother   . Heart attack Brother   . Hypertension Mother   . Hypertension Sister   . Peripheral vascular disease Sister   . Hypertension Daughter     History   Social History  . Marital Status: Married    Spouse Name: N/A  . Number of Children: 3  . Years of Education: N/A   Occupational History  . retired, Doctor, hospital    Social History Main Topics  . Smoking status: Former Smoker    Types: Cigarettes    Quit date: 02/15/1967  . Smokeless tobacco: Never Used  . Alcohol Use: No  . Drug Use: No  . Sexual Activity: Not on file   Other Topics Concern  . Not on file   Social History Narrative   Has living will    Wife, then oldest daughter to make health care decisions Luster Landsberg)    Will accept resuscitation but no prolonged mechanical ventilation   Not sure about tube feeds, might accept   Review of Systems Sleeps fairly well--except for recent respiratory illness Appetite is good Weight stable Bowels okay Full dentures Wears seat belt Edema seems better lately    Objective:   Physical Exam  Constitutional: He is oriented to person, place, and time. He appears well-developed. No distress.  HENT:  Mouth/Throat: Oropharynx is clear and moist. No oropharyngeal exudate.  Neck: Normal range of motion. Neck supple. No thyromegaly present.  Cardiovascular: Normal  rate, regular rhythm and intact distal pulses.  Exam reveals no gallop.   Faint distal pulses Trigeminy mostly Faint mitral systolic murmur  Pulmonary/Chest: Effort normal and breath sounds normal. No respiratory distress. He has no wheezes. He has no rales.  Abdominal: Soft. There is no tenderness.  Musculoskeletal: He exhibits no edema or tenderness.  Lymphadenopathy:    He has no cervical  adenopathy.  Neurological: He is alert and oriented to person, place, and time.  Slow with year and town. President-- "[place]" [id number]  Skin: No rash noted. No erythema.  Psychiatric: He has a normal mood and affect. His behavior is normal.          Assessment & Plan:

## 2014-04-30 NOTE — Patient Instructions (Addendum)
Please try to cut back on the omeprazole to just once a day. If you have any swallowing problems, go back to twice a day.

## 2014-04-30 NOTE — Assessment & Plan Note (Signed)
Competing concerns--- CVA risk with erythropoietin and his chronic DOE I still think he should not get the EPA if his hgb is over 9 They will discuss with Dr Briant CedarMattingly

## 2014-05-14 ENCOUNTER — Encounter (HOSPITAL_COMMUNITY)
Admission: RE | Admit: 2014-05-14 | Discharge: 2014-05-14 | Disposition: A | Discharge status: Home / Self Care | Payer: Medicare Other | Source: Ambulatory Visit | Attending: Nephrology | Admitting: Nephrology

## 2014-05-14 DIAGNOSIS — D631 Anemia in chronic kidney disease: Secondary | ICD-10-CM | POA: Diagnosis not present

## 2014-05-14 LAB — POCT HEMOGLOBIN-HEMACUE: Hemoglobin: 9.8 g/dL — ABNORMAL LOW (ref 13.0–17.0)

## 2014-05-14 MED ORDER — EPOETIN ALFA 20000 UNIT/ML IJ SOLN
20000.0000 [IU] | INTRAMUSCULAR | Status: DC
  Administered 2014-05-14: 20000 [IU] via SUBCUTANEOUS

## 2014-05-14 MED ORDER — EPOETIN ALFA 20000 UNIT/ML IJ SOLN
INTRAMUSCULAR | Status: AC
  Filled 2014-05-14: qty 1

## 2014-05-16 ENCOUNTER — Other Ambulatory Visit: Payer: Self-pay | Admitting: Cardiology

## 2014-05-22 ENCOUNTER — Other Ambulatory Visit: Payer: Self-pay | Admitting: Cardiology

## 2014-05-22 NOTE — Telephone Encounter (Signed)
Anthony AbedJeffrey D Katz, MD at 07/17/2013 10:24 AM  clopidogrel (PLAVIX) 75 MG tabletTAKE 1 TABLET (75 MG TOTAL) BY MOUTH DAILY Patient Instructions     Your physician recommends that you continue on your current medications as directed. Please refer to the Current Medication list given to you today

## 2014-06-04 ENCOUNTER — Encounter (HOSPITAL_COMMUNITY)
Admission: RE | Admit: 2014-06-04 | Discharge: 2014-06-04 | Disposition: A | Discharge status: Home / Self Care | Payer: Medicare Other | Source: Ambulatory Visit | Attending: Nephrology | Admitting: Nephrology

## 2014-06-04 DIAGNOSIS — N183 Chronic kidney disease, stage 3 (moderate): Secondary | ICD-10-CM | POA: Insufficient documentation

## 2014-06-04 DIAGNOSIS — D631 Anemia in chronic kidney disease: Secondary | ICD-10-CM | POA: Diagnosis present

## 2014-06-04 LAB — IRON AND TIBC
Iron: 69 ug/dL (ref 42–165)
Saturation Ratios: 30 % (ref 20–55)
TIBC: 229 ug/dL (ref 215–435)
UIBC: 160 ug/dL (ref 125–400)

## 2014-06-04 LAB — FERRITIN: FERRITIN: 1040 ng/mL — AB (ref 22–322)

## 2014-06-04 LAB — POCT HEMOGLOBIN-HEMACUE: Hemoglobin: 9.5 g/dL — ABNORMAL LOW (ref 13.0–17.0)

## 2014-06-04 MED ORDER — EPOETIN ALFA 20000 UNIT/ML IJ SOLN
20000.0000 [IU] | INTRAMUSCULAR | Status: DC
Start: 2014-06-04 — End: 2014-06-05

## 2014-06-04 MED ORDER — EPOETIN ALFA 20000 UNIT/ML IJ SOLN
INTRAMUSCULAR | Status: AC
  Administered 2014-06-04: 20000 [IU] via SUBCUTANEOUS
  Filled 2014-06-04: qty 1

## 2014-06-18 ENCOUNTER — Ambulatory Visit (HOSPITAL_COMMUNITY)
Admission: RE | Admit: 2014-06-18 | Discharge: 2014-06-18 | Disposition: A | Discharge status: Home / Self Care | Payer: Medicare Other | Source: Ambulatory Visit | Attending: Nephrology | Admitting: Nephrology

## 2014-06-18 DIAGNOSIS — Z79899 Other long term (current) drug therapy: Secondary | ICD-10-CM | POA: Diagnosis not present

## 2014-06-18 DIAGNOSIS — N183 Chronic kidney disease, stage 3 (moderate): Secondary | ICD-10-CM | POA: Insufficient documentation

## 2014-06-18 DIAGNOSIS — Z5181 Encounter for therapeutic drug level monitoring: Secondary | ICD-10-CM | POA: Insufficient documentation

## 2014-06-18 DIAGNOSIS — D631 Anemia in chronic kidney disease: Secondary | ICD-10-CM | POA: Diagnosis not present

## 2014-06-18 LAB — POCT HEMOGLOBIN-HEMACUE: Hemoglobin: 9.8 g/dL — ABNORMAL LOW (ref 13.0–17.0)

## 2014-06-18 MED ORDER — EPOETIN ALFA 20000 UNIT/ML IJ SOLN
INTRAMUSCULAR | Status: AC
  Filled 2014-06-18: qty 1

## 2014-06-18 MED ORDER — EPOETIN ALFA 20000 UNIT/ML IJ SOLN
20000.0000 [IU] | INTRAMUSCULAR | Status: DC
Start: 2014-06-18 — End: 2014-06-19
  Administered 2014-06-18: 20000 [IU] via SUBCUTANEOUS

## 2014-06-25 ENCOUNTER — Encounter (HOSPITAL_COMMUNITY): Payer: Medicare Other

## 2014-07-01 ENCOUNTER — Other Ambulatory Visit: Payer: Self-pay | Admitting: Cardiology

## 2014-07-02 ENCOUNTER — Encounter (HOSPITAL_COMMUNITY)
Admission: RE | Admit: 2014-07-02 | Discharge: 2014-07-02 | Disposition: A | Discharge status: Home / Self Care | Payer: Medicare Other | Source: Ambulatory Visit | Attending: Nephrology | Admitting: Nephrology

## 2014-07-02 DIAGNOSIS — D631 Anemia in chronic kidney disease: Secondary | ICD-10-CM | POA: Diagnosis present

## 2014-07-02 DIAGNOSIS — N183 Chronic kidney disease, stage 3 (moderate): Secondary | ICD-10-CM | POA: Insufficient documentation

## 2014-07-02 LAB — POCT HEMOGLOBIN-HEMACUE: Hemoglobin: 10.5 g/dL — ABNORMAL LOW (ref 13.0–17.0)

## 2014-07-02 MED ORDER — EPOETIN ALFA 20000 UNIT/ML IJ SOLN
INTRAMUSCULAR | Status: AC
  Administered 2014-07-02: 20000 [IU] via SUBCUTANEOUS
  Filled 2014-07-02: qty 1

## 2014-07-02 MED ORDER — EPOETIN ALFA 20000 UNIT/ML IJ SOLN: 20000.0000 [IU] | INTRAMUSCULAR | Status: DC

## 2014-07-16 ENCOUNTER — Encounter (HOSPITAL_COMMUNITY)
Admission: RE | Admit: 2014-07-16 | Discharge: 2014-07-16 | Disposition: A | Discharge status: Home / Self Care | Payer: Medicare Other | Source: Ambulatory Visit | Attending: Nephrology | Admitting: Nephrology

## 2014-07-16 DIAGNOSIS — N183 Chronic kidney disease, stage 3 (moderate): Secondary | ICD-10-CM | POA: Diagnosis not present

## 2014-07-16 DIAGNOSIS — D631 Anemia in chronic kidney disease: Secondary | ICD-10-CM | POA: Insufficient documentation

## 2014-07-16 LAB — POCT HEMOGLOBIN-HEMACUE: HEMOGLOBIN: 10.8 g/dL — AB (ref 13.0–17.0)

## 2014-07-16 MED ORDER — EPOETIN ALFA 20000 UNIT/ML IJ SOLN
20000.0000 [IU] | INTRAMUSCULAR | Status: DC
  Administered 2014-07-16: 20000 [IU] via SUBCUTANEOUS

## 2014-07-16 MED ORDER — EPOETIN ALFA 20000 UNIT/ML IJ SOLN
INTRAMUSCULAR | Status: AC
  Filled 2014-07-16: qty 1

## 2014-07-21 ENCOUNTER — Encounter: Payer: Self-pay | Admitting: Cardiology

## 2014-07-21 ENCOUNTER — Ambulatory Visit (INDEPENDENT_AMBULATORY_CARE_PROVIDER_SITE_OTHER): Payer: Medicare Other | Admitting: Cardiology

## 2014-07-21 VITALS — BP 134/76 | HR 61 | Ht 67.0 in | Wt 136.0 lb

## 2014-07-21 DIAGNOSIS — I10 Essential (primary) hypertension: Secondary | ICD-10-CM | POA: Diagnosis not present

## 2014-07-21 DIAGNOSIS — E785 Hyperlipidemia, unspecified: Secondary | ICD-10-CM

## 2014-07-21 DIAGNOSIS — I739 Peripheral vascular disease, unspecified: Secondary | ICD-10-CM

## 2014-07-21 DIAGNOSIS — R072 Precordial pain: Secondary | ICD-10-CM

## 2014-07-21 DIAGNOSIS — I779 Disorder of arteries and arterioles, unspecified: Secondary | ICD-10-CM

## 2014-07-21 NOTE — Assessment & Plan Note (Signed)
He takes Pravachol 40 mg daily. There is no reason to make any changes.

## 2014-07-21 NOTE — Assessment & Plan Note (Signed)
He has carotid disease with surgery in the past. He is followed very carefully by Dr. Edilia Boickson of the vascular surgery team.

## 2014-07-21 NOTE — Patient Instructions (Signed)
Medication Instructions:  Same-no change  Labwork: None  Testing/Procedures: None  Follow-Up: Your physician wants you to follow-up in: 1 year. You will receive a reminder letter in the mail two months in advance. If you don't receive a letter, please call our office to schedule the follow-up appointment.      

## 2014-07-21 NOTE — Assessment & Plan Note (Signed)
He is not having any recurrent chest pain. He has had some chest discomfort in 2011. There had been a nuclear scan in 2011 and 2012. There was no scar or ischemia. There is normal LV function. No further workup.

## 2014-07-21 NOTE — Progress Notes (Signed)
Cardiology Office Note   Date:  07/21/2014   ID:  Anthony Werner, DOB 12/24/1924, MRN [id number]  PCP:  Anthony Abide, MD  Cardiologist:  Anthony Rough, MD   Chief Complaint  Patient presents with  . Appointment    Follow-up coronary disease      History of Present Illness: Anthony Werner is a 81 y.o. male who presents to follow-up general cardiology issues. He has significant carotid disease that is followed by vascular surgery. He's had some mild shortness of breath over time but he's been quite stable. He is not having any significant chest pain. He had one episode of CHF when hospitalized with anemia in 2011. Otherwise he's been quite stable. He did have a nuclear study in 2011 showing no scar or ischemia.    Past Medical History  Diagnosis Date  . Hypertension   . Hyperlipidemia   . Anemia of chronic disease 04/2009    hgb 8...transfusion 04/2009  . CHF (congestive heart failure) 04/07/09    CHF while in hospital February, 2011, EF 45%  . Esophageal stricture     Dilatation and VA, / dilatation later by Dr. Arlyce Werner  . CKD (chronic kidney disease), stage II   . Carotid artery disease     Total right carotid, 60-79% LICA Dr. Edilia Werner, July, 2011 / left carotid endarterectomy January, 2012     . Community acquired pneumonia   . GERD (gastroesophageal reflux disease)   . Gout   . BPH (benign prostatic hyperplasia)   . Impaired fasting glucose   . Ejection fraction     45% by history  . Chest pain     Nuclear, April, 2011, no scar or ischemia    Past Surgical History  Procedure Laterality Date  . Cataract extraction  2008    Right  and   Left same year  . Hernia repair  2007  . Tonsillectomy    . Esophageal dilation      x3  . Carotid endarterectomy  LEFT    03/10/2010  . Iron injections  Sept. 2013 , Aug. 2014    Every 3 weeks  . Eye surgery Right 09-11-13    Vetroretinal " Laser "    Patient Active Problem List   Diagnosis Date Noted  . Carotid artery  disease     Priority: High  . GERD (gastroesophageal reflux disease)     Priority: High  . Ejection fraction     Priority: High  . Chest pain     Priority: High  . Advance directive discussed with patient 04/30/2014  . Shortness of breath 11/06/2013  . Swelling of limb 11/06/2013  . Edema 08/13/2013  . Routine general medical examination at a health care facility 04/22/2013  . Mild cognitive impairment 04/22/2013  . Allergic rhinitis due to pollen 10/22/2012  . Occlusion and stenosis of carotid artery without mention of cerebral infarction 10/19/2011  . 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 Hypertension   . Hyperlipidemia   . GOUT 07/15/2009  . BENIGN PROSTATIC HYPERTROPHY 07/15/2009  . CHRONIC KIDNEY DISEASE STAGE III (MODERATE) 04/30/2009  . ESOPHAGEAL STRICTURE 04/15/2009  . Anemia of chronic disease 04/14/2009      Current Outpatient Prescriptions  Medication Sig Dispense Refill  . amLODipine (NORVASC) 10 MG tablet Take 10 mg by mouth daily.    Marland Kitchen aspirin 81 MG tablet Take 81 mg by mouth daily.      Marland Kitchen b complex vitamins capsule Take 1 capsule by mouth daily.  Plus  C    . carvedilol (COREG) 12.5 MG tablet TAKE 1 TABLET BY MOUTH TWICE A DAY WITH A MEAL 180 tablet 4  . cholecalciferol (VITAMIN D) 1000 UNITS tablet Take 1,000 Units by mouth daily.    . clopidogrel (PLAVIX) 75 MG tablet TAKE 1 TABLET (75 MG TOTAL) BY MOUTH DAILY. 90 tablet 0  . furosemide (LASIX) 40 MG tablet Take 1 tablet (40 mg total) by mouth daily. 180 tablet 3  . isosorbide mononitrate (IMDUR) 30 MG 24 hr tablet TAKE 1 TABLET BY MOUTH EVERY DAY 30 tablet 0  . lisinopril (PRINIVIL,ZESTRIL) 20 MG tablet TAKE 1 TABLET BY MOUTH EVERY DAY 90 tablet 1  . omeprazole (PRILOSEC) 20 MG capsule TAKE ONE CAPSULE BY MOUTH TWICE A DAY 180 capsule 3  . pravastatin (PRAVACHOL) 40 MG tablet TAKE 1 TABLET (40 MG TOTAL) BY MOUTH DAILY. 90 tablet 3  . prednisoLONE acetate (PRED FORTE) 1 % ophthalmic suspension Place 1 drop into  the right eye daily.      No current facility-administered medications for this visit.   Facility-Administered Medications Ordered in Other Visits  Medication Dose Route Frequency Provider Last Rate Last Dose  . ferumoxytol [facility]) injection 510 mg  510 mg Intravenous Once Anthony Gauze, MD        Allergies:   Atenolol; Diltiazem; Hydrochlorothiazide; Labetalol; Lovastatin; Niacin; Simvastatin; Terazosin; and Iron    Social History:  The patient  reports that he quit smoking about 47 years ago. His smoking use included Cigarettes. He has never used smokeless tobacco. He reports that he does not drink alcohol or use illicit drugs.   Family History:  The patient's family history includes Cancer in his father; Heart attack in his brother; Heart disease in his brother and father; Hypertension in his brother, daughter, father, mother, and sister; Peripheral vascular disease in his sister.    ROS:  Please see the history of present illness.   Patient denies fever, chills, headache, sweats, rash, change in vision, change in hearing, chest pain, cough, nausea or vomiting, urinary symptoms. All other systems are reviewed and are negative.     PHYSICAL EXAM: VS:  BP 134/76 mmHg  Pulse 61  Ht  (1.702 m)  Wt 136 lb (61.689 kg)  BMI 21.30 kg/m2 , Patient looks quite good. He is here with his wife. He is oriented to person time and place. Affect is normal. Head is atraumatic. Sclera and conjunctiva are normal. There is no jugulovenous distention. Lungs are clear. Respiratory effort is nonlabored. Cardiac exam reveals S1 and S2. There is a soft systolic murmur. The abdomen is soft. His peripheral edema. There are no musculoskeletal deformities. There are no skin rashes. Neurologic is grossly intact.  EKG:   EKG is done today and reviewed by me. The EKG is normal.   Recent Labs: 04/16/2014: ALT 8; BUN 36*; Creatinine 1.64*; Platelets 172.0; Potassium 4.2; Sodium 139 07/16/2014: Hemoglobin  10.8*    Lipid Panel    Component Value Date/Time   CHOL 167 04/16/2014 0734   TRIG 119.0 04/16/2014 0734   HDL 56.50 04/16/2014 0734   CHOLHDL 3 04/16/2014 0734   VLDL 23.8 04/16/2014 0734   LDLCALC 87 04/16/2014 0734      Wt Readings from Last 3 Encounters:  07/21/14 136 lb (61.689 kg)  04/30/14 136 lb (61.689 kg)  11/06/13 138 lb (62.596 kg)      Current medicines are reviewed  Patient understands his medications.     ASSESSMENT  AND PLAN:

## 2014-07-21 NOTE — Assessment & Plan Note (Signed)
Blood pressure is controlled. No change in therapy. 

## 2014-07-26 ENCOUNTER — Other Ambulatory Visit: Payer: Self-pay | Admitting: Cardiology

## 2014-07-28 NOTE — Telephone Encounter (Signed)
Per note 6.6.13 

## 2014-07-30 ENCOUNTER — Encounter (HOSPITAL_COMMUNITY)
Admission: RE | Admit: 2014-07-30 | Discharge: 2014-07-30 | Disposition: A | Discharge status: Home / Self Care | Payer: Medicare Other | Source: Ambulatory Visit | Attending: Nephrology | Admitting: Nephrology

## 2014-07-30 DIAGNOSIS — D631 Anemia in chronic kidney disease: Secondary | ICD-10-CM | POA: Diagnosis not present

## 2014-07-30 LAB — POCT HEMOGLOBIN-HEMACUE: Hemoglobin: 11.4 g/dL — ABNORMAL LOW (ref 13.0–17.0)

## 2014-07-30 MED ORDER — EPOETIN ALFA 20000 UNIT/ML IJ SOLN
20000.0000 [IU] | INTRAMUSCULAR | Status: DC
  Administered 2014-07-30: 20000 [IU] via SUBCUTANEOUS

## 2014-07-30 MED ORDER — EPOETIN ALFA 20000 UNIT/ML IJ SOLN
INTRAMUSCULAR | Status: AC
  Filled 2014-07-30: qty 1

## 2014-08-01 ENCOUNTER — Other Ambulatory Visit: Payer: Self-pay | Admitting: Internal Medicine

## 2014-08-13 ENCOUNTER — Encounter (HOSPITAL_COMMUNITY)
Admission: RE | Admit: 2014-08-13 | Discharge: 2014-08-13 | Disposition: A | Discharge status: Home / Self Care | Payer: Medicare Other | Source: Ambulatory Visit | Attending: Nephrology | Admitting: Nephrology

## 2014-08-13 DIAGNOSIS — Z5181 Encounter for therapeutic drug level monitoring: Secondary | ICD-10-CM | POA: Diagnosis not present

## 2014-08-13 DIAGNOSIS — D631 Anemia in chronic kidney disease: Secondary | ICD-10-CM | POA: Diagnosis not present

## 2014-08-13 DIAGNOSIS — N183 Chronic kidney disease, stage 3 (moderate): Secondary | ICD-10-CM | POA: Diagnosis present

## 2014-08-13 DIAGNOSIS — Z79899 Other long term (current) drug therapy: Secondary | ICD-10-CM | POA: Insufficient documentation

## 2014-08-13 LAB — POCT HEMOGLOBIN-HEMACUE: HEMOGLOBIN: 11.6 g/dL — AB (ref 13.0–17.0)

## 2014-08-13 MED ORDER — EPOETIN ALFA 20000 UNIT/ML IJ SOLN
20000.0000 [IU] | INTRAMUSCULAR | Status: DC
  Administered 2014-08-13: 20000 [IU] via SUBCUTANEOUS

## 2014-08-13 MED ORDER — EPOETIN ALFA 20000 UNIT/ML IJ SOLN
INTRAMUSCULAR | Status: AC
  Filled 2014-08-13: qty 1

## 2014-08-23 ENCOUNTER — Other Ambulatory Visit: Payer: Self-pay | Admitting: Cardiology

## 2014-08-27 ENCOUNTER — Encounter (HOSPITAL_COMMUNITY)
Admission: RE | Admit: 2014-08-27 | Discharge: 2014-08-27 | Disposition: A | Discharge status: Home / Self Care | Payer: Medicare Other | Source: Ambulatory Visit | Attending: Nephrology | Admitting: Nephrology

## 2014-08-27 DIAGNOSIS — D631 Anemia in chronic kidney disease: Secondary | ICD-10-CM | POA: Diagnosis present

## 2014-08-27 DIAGNOSIS — N183 Chronic kidney disease, stage 3 (moderate): Secondary | ICD-10-CM | POA: Insufficient documentation

## 2014-08-27 LAB — IRON AND TIBC
Iron: 44 ug/dL — ABNORMAL LOW (ref 45–182)
Saturation Ratios: 17 % — ABNORMAL LOW (ref 17.9–39.5)
TIBC: 260 ug/dL (ref 250–450)
UIBC: 216 ug/dL

## 2014-08-27 LAB — POCT HEMOGLOBIN-HEMACUE: HEMOGLOBIN: 11.8 g/dL — AB (ref 13.0–17.0)

## 2014-08-27 LAB — FERRITIN: Ferritin: 895 ng/mL — ABNORMAL HIGH (ref 24–336)

## 2014-08-27 MED ORDER — EPOETIN ALFA 20000 UNIT/ML IJ SOLN
20000.0000 [IU] | INTRAMUSCULAR | Status: DC
  Administered 2014-08-27: 20000 [IU] via SUBCUTANEOUS

## 2014-08-27 MED ORDER — EPOETIN ALFA 20000 UNIT/ML IJ SOLN
INTRAMUSCULAR | Status: AC
  Filled 2014-08-27: qty 1

## 2014-09-08 ENCOUNTER — Other Ambulatory Visit (HOSPITAL_COMMUNITY): Payer: Self-pay | Admitting: *Deleted

## 2014-09-09 ENCOUNTER — Encounter (HOSPITAL_COMMUNITY)
Admission: RE | Admit: 2014-09-09 | Discharge: 2014-09-09 | Disposition: A | Discharge status: Home / Self Care | Payer: Medicare Other | Source: Ambulatory Visit | Attending: Nephrology | Admitting: Nephrology

## 2014-09-09 DIAGNOSIS — D631 Anemia in chronic kidney disease: Secondary | ICD-10-CM | POA: Diagnosis not present

## 2014-09-09 LAB — HEMOGLOBIN AND HEMATOCRIT, BLOOD
HCT: 37.2 % — ABNORMAL LOW (ref 39.0–52.0)
Hemoglobin: 11.9 g/dL — ABNORMAL LOW (ref 13.0–17.0)

## 2014-09-09 MED ORDER — EPOETIN ALFA 20000 UNIT/ML IJ SOLN
INTRAMUSCULAR | Status: AC
  Filled 2014-09-09: qty 1

## 2014-09-09 MED ORDER — EPOETIN ALFA 20000 UNIT/ML IJ SOLN
20000.0000 [IU] | INTRAMUSCULAR | Status: DC
  Administered 2014-09-09: 20000 [IU] via SUBCUTANEOUS

## 2014-09-09 MED ORDER — SODIUM CHLORIDE 0.9 % IV SOLN
510.0000 mg | INTRAVENOUS | Status: AC
  Administered 2014-09-09: 510 mg via INTRAVENOUS
  Filled 2014-09-09: qty 17

## 2014-09-10 ENCOUNTER — Other Ambulatory Visit: Payer: Self-pay | Admitting: Internal Medicine

## 2014-09-15 ENCOUNTER — Other Ambulatory Visit: Payer: Self-pay | Admitting: Internal Medicine

## 2014-09-23 ENCOUNTER — Encounter (HOSPITAL_COMMUNITY)
Admission: RE | Admit: 2014-09-23 | Discharge: 2014-09-23 | Disposition: A | Discharge status: Home / Self Care | Payer: Medicare Other | Source: Ambulatory Visit | Attending: Nephrology | Admitting: Nephrology

## 2014-09-23 DIAGNOSIS — D631 Anemia in chronic kidney disease: Secondary | ICD-10-CM | POA: Insufficient documentation

## 2014-09-23 DIAGNOSIS — N183 Chronic kidney disease, stage 3 (moderate): Secondary | ICD-10-CM | POA: Insufficient documentation

## 2014-09-23 LAB — POCT HEMOGLOBIN-HEMACUE: Hemoglobin: 11.6 g/dL — ABNORMAL LOW (ref 13.0–17.0)

## 2014-09-23 MED ORDER — EPOETIN ALFA 20000 UNIT/ML IJ SOLN
20000.0000 [IU] | INTRAMUSCULAR | Status: DC
  Administered 2014-09-23: 20000 [IU] via SUBCUTANEOUS

## 2014-09-23 MED ORDER — EPOETIN ALFA 20000 UNIT/ML IJ SOLN
INTRAMUSCULAR | Status: AC
  Administered 2014-09-23: 20000 [IU] via SUBCUTANEOUS
  Filled 2014-09-23: qty 1

## 2014-10-07 ENCOUNTER — Encounter (HOSPITAL_COMMUNITY)
Admission: RE | Admit: 2014-10-07 | Discharge: 2014-10-07 | Disposition: A | Discharge status: Home / Self Care | Payer: Medicare Other | Source: Ambulatory Visit | Attending: Nephrology | Admitting: Nephrology

## 2014-10-07 DIAGNOSIS — D631 Anemia in chronic kidney disease: Secondary | ICD-10-CM | POA: Diagnosis not present

## 2014-10-07 LAB — POCT HEMOGLOBIN-HEMACUE: HEMOGLOBIN: 12.3 g/dL — AB (ref 13.0–17.0)

## 2014-10-07 MED ORDER — EPOETIN ALFA 20000 UNIT/ML IJ SOLN: 20000.0000 [IU] | INTRAMUSCULAR | Status: DC

## 2014-10-21 ENCOUNTER — Encounter (HOSPITAL_COMMUNITY): Payer: Medicare Other

## 2014-10-27 ENCOUNTER — Encounter (HOSPITAL_COMMUNITY)
Admission: RE | Admit: 2014-10-27 | Discharge: 2014-10-27 | Disposition: A | Discharge status: Home / Self Care | Payer: Medicare Other | Source: Ambulatory Visit | Attending: Nephrology | Admitting: Nephrology

## 2014-10-27 DIAGNOSIS — D631 Anemia in chronic kidney disease: Secondary | ICD-10-CM | POA: Diagnosis not present

## 2014-10-27 DIAGNOSIS — N183 Chronic kidney disease, stage 3 (moderate): Secondary | ICD-10-CM | POA: Diagnosis not present

## 2014-10-27 LAB — POCT HEMOGLOBIN-HEMACUE: HEMOGLOBIN: 11.1 g/dL — AB (ref 13.0–17.0)

## 2014-10-27 MED ORDER — EPOETIN ALFA 20000 UNIT/ML IJ SOLN
20000.0000 [IU] | INTRAMUSCULAR | Status: DC
  Administered 2014-10-27: 20000 [IU] via SUBCUTANEOUS

## 2014-10-27 MED ORDER — EPOETIN ALFA 20000 UNIT/ML IJ SOLN
INTRAMUSCULAR | Status: AC
  Filled 2014-10-27: qty 1

## 2014-11-03 ENCOUNTER — Encounter: Payer: Self-pay | Admitting: Vascular Surgery

## 2014-11-05 ENCOUNTER — Other Ambulatory Visit: Payer: Self-pay | Admitting: *Deleted

## 2014-11-05 ENCOUNTER — Encounter: Payer: Self-pay | Admitting: Family

## 2014-11-05 ENCOUNTER — Ambulatory Visit (INDEPENDENT_AMBULATORY_CARE_PROVIDER_SITE_OTHER): Payer: Medicare Other | Admitting: Family

## 2014-11-05 ENCOUNTER — Ambulatory Visit (HOSPITAL_COMMUNITY)
Admission: RE | Admit: 2014-11-05 | Discharge: 2014-11-05 | Disposition: A | Discharge status: Home / Self Care | Payer: Medicare Other | Source: Ambulatory Visit | Attending: Family | Admitting: Family

## 2014-11-05 VITALS — BP 149/62 | HR 55 | Temp 97.2°F | Resp 16 | Ht 67.0 in | Wt 133.1 lb

## 2014-11-05 DIAGNOSIS — Z48812 Encounter for surgical aftercare following surgery on the circulatory system: Secondary | ICD-10-CM | POA: Diagnosis not present

## 2014-11-05 DIAGNOSIS — I6523 Occlusion and stenosis of bilateral carotid arteries: Secondary | ICD-10-CM | POA: Diagnosis present

## 2014-11-05 DIAGNOSIS — E785 Hyperlipidemia, unspecified: Secondary | ICD-10-CM | POA: Insufficient documentation

## 2014-11-05 DIAGNOSIS — I129 Hypertensive chronic kidney disease with stage 1 through stage 4 chronic kidney disease, or unspecified chronic kidney disease: Secondary | ICD-10-CM | POA: Insufficient documentation

## 2014-11-05 DIAGNOSIS — Z9889 Other specified postprocedural states: Secondary | ICD-10-CM

## 2014-11-05 DIAGNOSIS — I6521 Occlusion and stenosis of right carotid artery: Secondary | ICD-10-CM | POA: Diagnosis not present

## 2014-11-05 DIAGNOSIS — N182 Chronic kidney disease, stage 2 (mild): Secondary | ICD-10-CM | POA: Insufficient documentation

## 2014-11-05 DIAGNOSIS — Z87891 Personal history of nicotine dependence: Secondary | ICD-10-CM

## 2014-11-05 NOTE — Progress Notes (Signed)
Established Carotid Patient   History of Present Illness  Anthony Werner is a 81 y.o. male who is s/p left carotid endarterectomy in 2012 by Dr. Edilia Bo. 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 Previous carotid Duplex showed no evidence of significant recurrent carotid stenosis on the left side. He has a known right internal carotid artery occlusion.  He returns today for Duplex carotid artery surveillance.  Patient denies ever having a stroke or TIA's before or after his CEA.  Specifically he denies amaurosis fugax or monocular blindness, denies unilateral facial drooping, denies hemiplegia, denies receptive or expressive aphasia.   He denies claudication symptoms.  Patient reports New Medical or Surgical History: none.  He has a history of right eye surgery, having to do with the vitreous.   PMHx is significant for CHF which has resolved, he denies history of MI.   Pt Diabetic: No  Pt smoker: former smoker, quit 1979, smoked for 20 years   Pt meds include:  Statin : Yes  Betablocker: Yes  ASA: Yes  Other anticoagulants/antiplatelets: Plavix     Past Medical History  Diagnosis Date  . Hypertension   . Hyperlipidemia   . Anemia of chronic disease 04/2009    hgb 8...transfusion 04/2009  . CHF (congestive heart failure) 04/07/09    CHF while in hospital February, 2011, EF 45%  . Esophageal stricture     Dilatation and VA, / dilatation later by Dr. Arlyce Dice  . CKD (chronic kidney disease), stage II   . Carotid artery disease     Total right carotid, 60-79% LICA Dr. Edilia Bo, July, 2011 / left carotid endarterectomy January, 2012     . Community acquired pneumonia   . GERD (gastroesophageal reflux disease)   . Gout   . BPH (benign prostatic hyperplasia)   . Impaired fasting glucose   . Ejection fraction     45% by history  . Chest pain     Nuclear, April, 2011, no scar or ischemia    Social History Social History  Substance Use Topics  .  Smoking status: Former Smoker    Types: Cigarettes    Quit date: 02/15/1967  . Smokeless tobacco: Never Used  . Alcohol Use: No    Family History Family History  Problem Relation Age of Onset  . Cancer Father     Prostate  . Hypertension Father   . Heart disease Father     Heart Disease before age 67  . Heart disease Brother     x2  . Hypertension Brother   . Heart attack Brother   . Hypertension Mother   . Hypertension Sister   . Peripheral vascular disease Sister   . Hypertension Daughter     Surgical History Past Surgical History  Procedure Laterality Date  . Cataract extraction  2008    Right  and   Left same year  . Hernia repair  2007  . Tonsillectomy    . Esophageal dilation      x3  . Carotid endarterectomy  LEFT    03/10/2010  . Iron injections  Sept. 2013 , Aug. 2014    Every 3 weeks  . Eye surgery Right 09-11-13    Vetroretinal " Laser "    Allergies  Allergen Reactions  . Atenolol   . Diltiazem   . Hydrochlorothiazide   . Labetalol   . Lovastatin   . Niacin   . Simvastatin   . Terazosin   . Iron  Just had constipation    Current Outpatient Prescriptions  Medication Sig Dispense Refill  . amLODipine (NORVASC) 10 MG tablet Take 10 mg by mouth daily.    Marland Kitchen aspirin 81 MG tablet Take 81 mg by mouth daily.      Marland Kitchen b complex vitamins capsule Take 1 capsule by mouth daily. Plus  C    . carvedilol (COREG) 12.5 MG tablet TAKE 1 TABLET BY MOUTH TWICE A DAY WITH A MEAL 180 tablet 4  . cholecalciferol (VITAMIN D) 1000 UNITS tablet Take 1,000 Units by mouth daily.    . clopidogrel (PLAVIX) 75 MG tablet TAKE 1 TABLET (75 MG TOTAL) BY MOUTH DAILY. 90 tablet 2  . furosemide (LASIX) 40 MG tablet TAKE 1 TABLET (40 MG TOTAL) BY MOUTH DAILY, MAY TAKE 2ND DOSE AT LUNCHTIME IF FEET ARE SWOLLEN 180 tablet 3  . isosorbide mononitrate (IMDUR) 30 MG 24 hr tablet TAKE 1 TABLET BY MOUTH EVERY DAY 30 tablet 6  . lisinopril (PRINIVIL,ZESTRIL) 20 MG tablet TAKE 1 TABLET  BY MOUTH EVERY DAY 90 tablet 1  . omeprazole (PRILOSEC) 20 MG capsule TAKE ONE CAPSULE BY MOUTH TWICE A DAY 180 capsule 3  . pravastatin (PRAVACHOL) 40 MG tablet TAKE 1 TABLET (40 MG TOTAL) BY MOUTH DAILY. 90 tablet 3  . prednisoLONE acetate (PRED FORTE) 1 % ophthalmic suspension Place 1 drop into the right eye daily.      No current facility-administered medications for this visit.   Facility-Administered Medications Ordered in Other Visits  Medication Dose Route Frequency Provider Last Rate Last Dose  . ferumoxytol [facility]) injection 510 mg  510 mg Intravenous Once Primitivo Gauze, MD        Review of Systems : See HPI for pertinent positives and negatives.  Physical Examination  Filed Vitals:   11/05/14 1143 11/05/14 1146  BP: 155/63 149/62  Pulse: 55   Height:  (1.702 m)   Weight: 133 lb 1.6 oz (60.374 kg)    Body mass index is 20.84 kg/(m^2).  General: WDWN male in NAD  GAIT: normal  Eyes: PERRLA  Pulmonary: CTAB, no rales, no rhonchi, & no wheezing.  Cardiac: regular rhythm, no murmur detected.  VASCULAR EXAM  Carotid Bruits  Left  Right    positive posiive    Radial pulses are 2+ palpable and equal. Aorta is not palpable.  LE Pulses  LEFT  RIGHT   FEMORAL  palpable  palpable   POPLITEAL  not palpable  not palpable   POSTERIOR TIBIAL  not palpable  not palpable   DORSALIS PEDIS  ANTERIOR TIBIAL  palpable  palpable    Gastrointestinal: soft, nontender, BS WNL, no r/g, no palpated mass.  Musculoskeletal: No muscle atrophy/wasting. M/S 5/5 throughout, extremities without ischemic changes.  Neurologic: A&O X 3; Appropriate Affect, Speech is normal  CN 2-12 intact, Pain and light touch intact in extremities, Motor exam as listed above.          Non-Invasive Vascular Imaging CAROTID DUPLEX 11/05/2014   CEREBROVASCULAR DUPLEX EVALUATION    INDICATION: Carotid artery disease    PREVIOUS INTERVENTION(S): Known  occlusion of right internal carotid artery. Left carotid endarterectomy 03/09/2010    DUPLEX EXAM: Carotid duplex    RIGHT  LEFT  Peak Systolic Velocities (cm/s) End Diastolic Velocities (cm/s) Plaque LOCATION Peak Systolic Velocities (cm/s) End Diastolic Velocities (cm/s) Plaque  46 11 HT CCA PROXIMAL 97 14 HT  49 2 HT CCA MID 128 15 HT  53 16 HT CCA  DISTAL 106 13 -  418 16 HT ECA 112 13 HT  Occluded - HT ICA PROXIMAL 124 15 -  Occluded - HT ICA MID 127 22 -  Occluded - HT ICA DISTAL 108 17 -    N/A ICA / CCA Ratio (PSV) N/A  Antegrade Vertebral Flow Antegrade  150 Brachial Systolic Pressure (mmHg) 144  Triphasic Brachial Artery Waveforms Triphasic    Plaque Morphology:  HM = Homogeneous, HT = Heterogeneous, CP = Calcific Plaque, SP = Smooth Plaque, IP = Irregular Plaque     ADDITIONAL FINDINGS:     IMPRESSION: 1. Known occlusion of the right internal carotid artery 2. Right external carotid artery stenosis 3. Patent left carotid endarterectomy without evidence for restenosis.    Compared to the previous exam:  No change since exam of 11/06/2013      Assessment: MYRAN ARCIA is a 81 y.o. male who is s/p left carotid endarterectomy in 2012 by Dr. Edilia Bo. 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 Pt has no history of stroke or TIA. Today's carotid duplex suggests known occlusion of the right internal carotid artery, right external carotid artery stenosis, and a patent left carotid endarterectomy without evidence for restenosis. No change since exam of 11/06/2013.    Plan: Follow-up in 1 year with Carotid Duplex.   I discussed in depth with the patient the nature of atherosclerosis, and emphasized the importance of maximal medical management including strict control of blood pressure, blood glucose, and lipid levels, obtaining regular exercise, and continued cessation of smoking.  The patient is aware that without maximal medical management the underlying  atherosclerotic disease process will progress, limiting the benefit of any interventions. The patient was given information about stroke prevention and what symptoms should prompt the patient to seek immediate medical care. Thank you for allowing [place] to participate in this patient's care.  Charisse March, RN, MSN, FNP-C [facility] Office: [phone]  Clinic Physician: Edilia Bo  11/05/2014 11:47 AM

## 2014-11-05 NOTE — Patient Instructions (Signed)
Stroke Prevention Some medical conditions and behaviors are associated with an increased chance of having a stroke. You may prevent a stroke by making healthy choices and managing medical conditions. HOW CAN I REDUCE MY RISK OF HAVING A STROKE?   Stay physically active. Get at least 30 minutes of activity on most or all days.  Do not smoke. It may also be helpful to avoid exposure to secondhand smoke.  Limit alcohol use. Moderate alcohol use is considered to be:  No more than 2 drinks per day for men.  No more than 1 drink per day for nonpregnant women.  Eat healthy foods. This involves:  Eating 5 or more servings of fruits and vegetables a day.  Making dietary changes that address high blood pressure (hypertension), high cholesterol, diabetes, or obesity.  Manage your cholesterol levels.  Making food choices that are high in fiber and low in saturated fat, trans fat, and cholesterol may control cholesterol levels.  Take any prescribed medicines to control cholesterol as directed by your health care provider.  Manage your diabetes.  Controlling your carbohydrate and sugar intake is recommended to manage diabetes.  Take any prescribed medicines to control diabetes as directed by your health care provider.  Control your hypertension.  Making food choices that are low in salt (sodium), saturated fat, trans fat, and cholesterol is recommended to manage hypertension.  Take any prescribed medicines to control hypertension as directed by your health care provider.  Maintain a healthy weight.  Reducing calorie intake and making food choices that are low in sodium, saturated fat, trans fat, and cholesterol are recommended to manage weight.  Stop drug abuse.  Avoid taking birth control pills.  Talk to your health care provider about the risks of taking birth control pills if you are over 35 years old, smoke, get migraines, or have ever had a blood clot.  Get evaluated for sleep  disorders (sleep apnea).  Talk to your health care provider about getting a sleep evaluation if you snore a lot or have excessive sleepiness.  Take medicines only as directed by your health care provider.  For some people, aspirin or blood thinners (anticoagulants) are helpful in reducing the risk of forming abnormal blood clots that can lead to stroke. If you have the irregular heart rhythm of atrial fibrillation, you should be on a blood thinner unless there is a good reason you cannot take them.  Understand all your medicine instructions.  Make sure that other conditions (such as anemia or atherosclerosis) are addressed. SEEK IMMEDIATE MEDICAL CARE IF:   You have sudden weakness or numbness of the face, arm, or leg, especially on one side of the body.  Your face or eyelid droops to one side.  You have sudden confusion.  You have trouble speaking (aphasia) or understanding.  You have sudden trouble seeing in one or both eyes.  You have sudden trouble walking.  You have dizziness.  You have a loss of balance or coordination.  You have a sudden, severe headache with no known cause.  You have new chest pain or an irregular heartbeat. Any of these symptoms may represent a serious problem that is an emergency. Do not wait to see if the symptoms will go away. Get medical help at once. Call your local emergency services (911 in [place] Do not drive yourself to the hospital. Document Released: 03/10/2004 Document Revised: 06/17/2013 Document Reviewed: 08/03/2012 Exit[employer] Patient Information 2015 [employer]. This information is not intended to replace advice given   to you by your health care provider. Make sure you discuss any questions you have with your health care provider.  

## 2014-11-10 ENCOUNTER — Other Ambulatory Visit: Payer: Self-pay | Admitting: Cardiology

## 2014-11-10 NOTE — Telephone Encounter (Signed)
Luis Abed, MD at 07/21/2014 11:13 AM  lisinopril (PRINIVIL,ZESTRIL) 20 MG tabletTAKE 1 TABLET BY MOUTH EVERY DAY Patient Instructions     Medication Instructions:  Same-no change

## 2014-11-18 ENCOUNTER — Encounter (HOSPITAL_COMMUNITY): Payer: Medicare Other

## 2014-11-18 ENCOUNTER — Encounter (HOSPITAL_COMMUNITY)
Admission: RE | Admit: 2014-11-18 | Discharge: 2014-11-18 | Disposition: A | Discharge status: Home / Self Care | Payer: Medicare Other | Source: Ambulatory Visit | Attending: Nephrology | Admitting: Nephrology

## 2014-11-18 DIAGNOSIS — N183 Chronic kidney disease, stage 3 (moderate): Secondary | ICD-10-CM | POA: Diagnosis not present

## 2014-11-18 DIAGNOSIS — D631 Anemia in chronic kidney disease: Secondary | ICD-10-CM | POA: Diagnosis present

## 2014-11-18 LAB — IRON AND TIBC
IRON: 84 ug/dL (ref 45–182)
SATURATION RATIOS: 35 % (ref 17.9–39.5)
TIBC: 239 ug/dL — AB (ref 250–450)
UIBC: 155 ug/dL

## 2014-11-18 LAB — POCT HEMOGLOBIN-HEMACUE: Hemoglobin: 10.6 g/dL — ABNORMAL LOW (ref 13.0–17.0)

## 2014-11-18 LAB — FERRITIN: Ferritin: 1064 ng/mL — ABNORMAL HIGH (ref 24–336)

## 2014-11-18 MED ORDER — EPOETIN ALFA 20000 UNIT/ML IJ SOLN
20000.0000 [IU] | INTRAMUSCULAR | Status: DC
  Administered 2014-11-18: 20000 [IU] via SUBCUTANEOUS

## 2014-11-18 MED ORDER — EPOETIN ALFA 20000 UNIT/ML IJ SOLN
INTRAMUSCULAR | Status: AC
  Filled 2014-11-18: qty 1

## 2014-11-28 ENCOUNTER — Other Ambulatory Visit: Payer: Self-pay | Admitting: Cardiology

## 2014-12-09 ENCOUNTER — Encounter (HOSPITAL_COMMUNITY)
Admission: RE | Admit: 2014-12-09 | Discharge: 2014-12-09 | Disposition: A | Discharge status: Home / Self Care | Payer: Medicare Other | Source: Ambulatory Visit | Attending: Nephrology | Admitting: Nephrology

## 2014-12-09 DIAGNOSIS — D631 Anemia in chronic kidney disease: Secondary | ICD-10-CM | POA: Diagnosis not present

## 2014-12-09 LAB — POCT HEMOGLOBIN-HEMACUE: HEMOGLOBIN: 10.2 g/dL — AB (ref 13.0–17.0)

## 2014-12-09 MED ORDER — EPOETIN ALFA 20000 UNIT/ML IJ SOLN
20000.0000 [IU] | INTRAMUSCULAR | Status: DC
  Administered 2014-12-09: 20000 [IU] via SUBCUTANEOUS

## 2014-12-09 MED ORDER — EPOETIN ALFA 20000 UNIT/ML IJ SOLN
INTRAMUSCULAR | Status: AC
Start: 2014-12-09 — End: 2014-12-09
  Filled 2014-12-09: qty 1

## 2014-12-30 ENCOUNTER — Encounter (HOSPITAL_COMMUNITY)
Admission: RE | Admit: 2014-12-30 | Discharge: 2014-12-30 | Disposition: A | Discharge status: Home / Self Care | Payer: Medicare Other | Source: Ambulatory Visit | Attending: Nephrology | Admitting: Nephrology

## 2014-12-30 DIAGNOSIS — N183 Chronic kidney disease, stage 3 (moderate): Secondary | ICD-10-CM | POA: Insufficient documentation

## 2014-12-30 DIAGNOSIS — D631 Anemia in chronic kidney disease: Secondary | ICD-10-CM | POA: Insufficient documentation

## 2014-12-30 MED ORDER — EPOETIN ALFA 20000 UNIT/ML IJ SOLN
INTRAMUSCULAR | Status: AC
Start: 2014-12-30 — End: 2014-12-30
  Administered 2014-12-30: 20000 [IU] via SUBCUTANEOUS
  Filled 2014-12-30: qty 1

## 2014-12-30 MED ORDER — EPOETIN ALFA 20000 UNIT/ML IJ SOLN
20000.0000 [IU] | INTRAMUSCULAR | Status: DC
  Administered 2014-12-30: 20000 [IU] via SUBCUTANEOUS

## 2014-12-31 LAB — POCT HEMOGLOBIN-HEMACUE: Hemoglobin: 9.7 g/dL — ABNORMAL LOW (ref 13.0–17.0)

## 2015-01-12 ENCOUNTER — Other Ambulatory Visit (HOSPITAL_COMMUNITY): Payer: Self-pay | Admitting: *Deleted

## 2015-01-13 ENCOUNTER — Encounter (HOSPITAL_COMMUNITY)
Admission: RE | Admit: 2015-01-13 | Discharge: 2015-01-13 | Disposition: A | Discharge status: Home / Self Care | Payer: Medicare Other | Source: Ambulatory Visit | Attending: Nephrology | Admitting: Nephrology

## 2015-01-13 DIAGNOSIS — D631 Anemia in chronic kidney disease: Secondary | ICD-10-CM | POA: Diagnosis not present

## 2015-01-13 LAB — FERRITIN: FERRITIN: 1047 ng/mL — AB (ref 24–336)

## 2015-01-13 LAB — IRON AND TIBC
IRON: 65 ug/dL (ref 45–182)
Saturation Ratios: 27 % (ref 17.9–39.5)
TIBC: 244 ug/dL — AB (ref 250–450)
UIBC: 179 ug/dL

## 2015-01-13 LAB — POCT HEMOGLOBIN-HEMACUE: Hemoglobin: 9.9 g/dL — ABNORMAL LOW (ref 13.0–17.0)

## 2015-01-13 MED ORDER — EPOETIN ALFA 20000 UNIT/ML IJ SOLN
20000.0000 [IU] | INTRAMUSCULAR | Status: DC
  Administered 2015-01-13: 20000 [IU] via SUBCUTANEOUS

## 2015-01-13 MED ORDER — EPOETIN ALFA 20000 UNIT/ML IJ SOLN
INTRAMUSCULAR | Status: AC
  Filled 2015-01-13: qty 1

## 2015-01-20 ENCOUNTER — Encounter (HOSPITAL_COMMUNITY): Payer: Medicare Other

## 2015-01-27 ENCOUNTER — Encounter (HOSPITAL_COMMUNITY)
Admission: RE | Admit: 2015-01-27 | Discharge: 2015-01-27 | Disposition: A | Discharge status: Home / Self Care | Payer: Medicare Other | Source: Ambulatory Visit | Attending: Nephrology | Admitting: Nephrology

## 2015-01-27 DIAGNOSIS — N183 Chronic kidney disease, stage 3 (moderate): Secondary | ICD-10-CM | POA: Diagnosis not present

## 2015-01-27 DIAGNOSIS — D631 Anemia in chronic kidney disease: Secondary | ICD-10-CM | POA: Insufficient documentation

## 2015-01-27 LAB — POCT HEMOGLOBIN-HEMACUE: Hemoglobin: 10.2 g/dL — ABNORMAL LOW (ref 13.0–17.0)

## 2015-01-27 MED ORDER — EPOETIN ALFA 20000 UNIT/ML IJ SOLN
INTRAMUSCULAR | Status: AC
  Filled 2015-01-27: qty 1

## 2015-01-27 MED ORDER — EPOETIN ALFA 20000 UNIT/ML IJ SOLN
20000.0000 [IU] | INTRAMUSCULAR | Status: DC
  Administered 2015-01-27: 20000 [IU] via SUBCUTANEOUS

## 2015-02-10 ENCOUNTER — Encounter (HOSPITAL_COMMUNITY)
Admission: RE | Admit: 2015-02-10 | Discharge: 2015-02-10 | Disposition: A | Discharge status: Home / Self Care | Payer: Medicare Other | Source: Ambulatory Visit | Attending: Nephrology | Admitting: Nephrology

## 2015-02-10 DIAGNOSIS — D631 Anemia in chronic kidney disease: Secondary | ICD-10-CM | POA: Diagnosis not present

## 2015-02-10 LAB — POCT HEMOGLOBIN-HEMACUE: Hemoglobin: 10.8 g/dL — ABNORMAL LOW (ref 13.0–17.0)

## 2015-02-10 MED ORDER — EPOETIN ALFA 20000 UNIT/ML IJ SOLN
INTRAMUSCULAR | Status: AC
  Administered 2015-02-10: 20000 [IU] via SUBCUTANEOUS
  Filled 2015-02-10: qty 1

## 2015-02-10 MED ORDER — EPOETIN ALFA 20000 UNIT/ML IJ SOLN
20000.0000 [IU] | INTRAMUSCULAR | Status: DC
  Administered 2015-02-10: 20000 [IU] via SUBCUTANEOUS

## 2015-02-24 ENCOUNTER — Encounter (HOSPITAL_COMMUNITY)
Admission: RE | Admit: 2015-02-24 | Discharge: 2015-02-24 | Disposition: A | Discharge status: Home / Self Care | Payer: Medicare Other | Source: Ambulatory Visit | Attending: Nephrology | Admitting: Nephrology

## 2015-02-24 DIAGNOSIS — N183 Chronic kidney disease, stage 3 (moderate): Secondary | ICD-10-CM | POA: Diagnosis not present

## 2015-02-24 DIAGNOSIS — D631 Anemia in chronic kidney disease: Secondary | ICD-10-CM | POA: Diagnosis not present

## 2015-02-24 LAB — IRON AND TIBC
IRON: 62 ug/dL (ref 45–182)
Saturation Ratios: 26 % (ref 17.9–39.5)
TIBC: 241 ug/dL — ABNORMAL LOW (ref 250–450)
UIBC: 179 ug/dL

## 2015-02-24 LAB — POCT HEMOGLOBIN-HEMACUE: HEMOGLOBIN: 11.2 g/dL — AB (ref 13.0–17.0)

## 2015-02-24 LAB — FERRITIN: Ferritin: 915 ng/mL — ABNORMAL HIGH (ref 24–336)

## 2015-02-24 MED ORDER — EPOETIN ALFA 20000 UNIT/ML IJ SOLN
20000.0000 [IU] | INTRAMUSCULAR | Status: DC
  Administered 2015-02-24: 20000 [IU] via SUBCUTANEOUS

## 2015-02-24 MED ORDER — EPOETIN ALFA 20000 UNIT/ML IJ SOLN
INTRAMUSCULAR | Status: AC
  Filled 2015-02-24: qty 1

## 2015-03-10 ENCOUNTER — Encounter (HOSPITAL_COMMUNITY)
Admission: RE | Admit: 2015-03-10 | Discharge: 2015-03-10 | Disposition: A | Discharge status: Home / Self Care | Payer: Medicare Other | Source: Ambulatory Visit | Attending: Nephrology | Admitting: Nephrology

## 2015-03-10 DIAGNOSIS — D631 Anemia in chronic kidney disease: Secondary | ICD-10-CM | POA: Diagnosis not present

## 2015-03-10 LAB — POCT HEMOGLOBIN-HEMACUE: Hemoglobin: 11.4 g/dL — ABNORMAL LOW (ref 13.0–17.0)

## 2015-03-10 MED ORDER — EPOETIN ALFA 20000 UNIT/ML IJ SOLN
INTRAMUSCULAR | Status: AC
  Filled 2015-03-10: qty 1

## 2015-03-10 MED ORDER — EPOETIN ALFA 20000 UNIT/ML IJ SOLN
20000.0000 [IU] | INTRAMUSCULAR | Status: DC
  Administered 2015-03-10: 20000 [IU] via SUBCUTANEOUS

## 2015-03-13 ENCOUNTER — Other Ambulatory Visit: Payer: Self-pay | Admitting: Cardiology

## 2015-03-24 ENCOUNTER — Encounter (HOSPITAL_COMMUNITY)
Admission: RE | Admit: 2015-03-24 | Discharge: 2015-03-24 | Disposition: A | Discharge status: Home / Self Care | Payer: Medicare Other | Source: Ambulatory Visit | Attending: Nephrology | Admitting: Nephrology

## 2015-03-24 DIAGNOSIS — D631 Anemia in chronic kidney disease: Secondary | ICD-10-CM | POA: Diagnosis not present

## 2015-03-24 DIAGNOSIS — N183 Chronic kidney disease, stage 3 (moderate): Secondary | ICD-10-CM | POA: Insufficient documentation

## 2015-03-24 LAB — POCT HEMOGLOBIN-HEMACUE: Hemoglobin: 11.4 g/dL — ABNORMAL LOW (ref 13.0–17.0)

## 2015-03-24 MED ORDER — EPOETIN ALFA 20000 UNIT/ML IJ SOLN
20000.0000 [IU] | INTRAMUSCULAR | Status: DC
  Administered 2015-03-24: 20000 [IU] via SUBCUTANEOUS

## 2015-03-24 MED ORDER — EPOETIN ALFA 20000 UNIT/ML IJ SOLN
INTRAMUSCULAR | Status: AC
Start: 2015-03-24 — End: 2015-03-24
  Filled 2015-03-24: qty 1

## 2015-04-07 ENCOUNTER — Encounter (HOSPITAL_COMMUNITY)
Admission: RE | Admit: 2015-04-07 | Discharge: 2015-04-07 | Disposition: A | Discharge status: Home / Self Care | Payer: Medicare Other | Source: Ambulatory Visit | Attending: Nephrology | Admitting: Nephrology

## 2015-04-07 DIAGNOSIS — D631 Anemia in chronic kidney disease: Secondary | ICD-10-CM | POA: Diagnosis not present

## 2015-04-07 LAB — POCT HEMOGLOBIN-HEMACUE: HEMOGLOBIN: 11.4 g/dL — AB (ref 13.0–17.0)

## 2015-04-07 MED ORDER — EPOETIN ALFA 20000 UNIT/ML IJ SOLN
INTRAMUSCULAR | Status: AC
  Filled 2015-04-07: qty 1

## 2015-04-07 MED ORDER — EPOETIN ALFA 20000 UNIT/ML IJ SOLN
20000.0000 [IU] | INTRAMUSCULAR | Status: DC
  Administered 2015-04-07: 20000 [IU] via SUBCUTANEOUS

## 2015-04-21 ENCOUNTER — Encounter (HOSPITAL_COMMUNITY)
Admission: RE | Admit: 2015-04-21 | Discharge: 2015-04-21 | Disposition: A | Discharge status: Home / Self Care | Payer: Medicare Other | Source: Ambulatory Visit | Attending: Nephrology | Admitting: Nephrology

## 2015-04-21 DIAGNOSIS — D631 Anemia in chronic kidney disease: Secondary | ICD-10-CM | POA: Diagnosis present

## 2015-04-21 DIAGNOSIS — N183 Chronic kidney disease, stage 3 (moderate): Secondary | ICD-10-CM | POA: Diagnosis not present

## 2015-04-21 LAB — POCT HEMOGLOBIN-HEMACUE: HEMOGLOBIN: 11.4 g/dL — AB (ref 13.0–17.0)

## 2015-04-21 MED ORDER — EPOETIN ALFA 20000 UNIT/ML IJ SOLN
INTRAMUSCULAR | Status: AC
  Filled 2015-04-21: qty 1

## 2015-04-21 MED ORDER — EPOETIN ALFA 20000 UNIT/ML IJ SOLN
20000.0000 [IU] | INTRAMUSCULAR | Status: DC
  Administered 2015-04-21: 20000 [IU] via SUBCUTANEOUS

## 2015-04-22 ENCOUNTER — Encounter: Payer: Self-pay | Admitting: Podiatry

## 2015-04-22 ENCOUNTER — Ambulatory Visit (INDEPENDENT_AMBULATORY_CARE_PROVIDER_SITE_OTHER): Payer: Medicare Other | Admitting: Podiatry

## 2015-04-22 VITALS — BP 151/60 | HR 86 | Resp 12

## 2015-04-22 DIAGNOSIS — B351 Tinea unguium: Secondary | ICD-10-CM | POA: Diagnosis not present

## 2015-04-22 DIAGNOSIS — M79675 Pain in left toe(s): Secondary | ICD-10-CM | POA: Diagnosis not present

## 2015-04-22 DIAGNOSIS — M79674 Pain in right toe(s): Secondary | ICD-10-CM

## 2015-04-22 NOTE — Patient Instructions (Signed)
Today I trimmed your thickened and elongated toenails in the right and left feet. There is a small bleeding 10 to the fourth right toe which was treated with antibiotic ointment and a Band-Aid removed Band-Aid on fourth right toe 1-3 days and apply topical antibiotic ointment and a Band-Aid daily until a scab forms. Return as needed for trimming of thick and elongated toenails or at 3-4 month intervals

## 2015-04-22 NOTE — Progress Notes (Signed)
   Subjective:    Patient ID: Anthony Werner, male    DOB: 11/16/1924, 82 y.o.   MRN: [id number]  HPI    This patient presents today complaining of gradually thickened and elongated toenails on right and left feet over a multiple month period of time causing shoe wearing walking uncomfortable and is requesting debridement of these toenails he says that the toenails are uncomfortable which are noticeable when he walks on it ongoing daily basis. Patient is attempted to trim the toenails himself, however, the nails remain painful after he trims her nails. Patient denies previous podiatric care. Patient denies any history of open wounds, claudication or amputation.  Review of Systems  HENT: Positive for hearing loss.   Psychiatric/Behavioral: The patient is nervous/anxious.        Objective:   Physical Exam  Pleasant orientated 3  Vascular: No peripheral edema noted bilaterally DP 1/4 bilaterally PT pulses nonpalpable bilaterally Capillary reflex immediate bilaterally  Neurological: Sensation to 10 g monofilament wire intact 5/5 bilaterally Vibratory sensation reactive bilaterally Ankle reflex equal and reactive bilaterally  Dermatological: Atrophic skin without any open skin lesions bilaterally The toenails are elongated, incurvated, deformed, discolored with maximum deformity in the right hallux nail. Some of the lesser toenails are incurvated in embedding to the distal toes on the right and left feet  Musculoskeletal: Pes planus bilaterally HAV bilaterally There is no restriction ankle, subtalar, midtarsal joints bilaterally      Assessment & Plan:   Assessment: Decrease pedal pulses bilaterally without history of claudication or open wounds Satisfactory logical status Neglected symptomatic onychomycoses 6-10  Plan: Debridement toenails 6-10 mechanically electrically. Small bleeding distal fourth right toe treated with antibiotic ointment and Band-Aid. Patient advised  to move Band-Aid 1-3 days on the fourth right toe and continue to apply topical antibiotic ointment and Band-Aid until a scab forms  Reappoint 3-4 months or at patient's request

## 2015-05-05 ENCOUNTER — Encounter (HOSPITAL_COMMUNITY): Payer: Medicare Other

## 2015-05-05 ENCOUNTER — Ambulatory Visit (INDEPENDENT_AMBULATORY_CARE_PROVIDER_SITE_OTHER): Payer: Medicare Other | Admitting: Internal Medicine

## 2015-05-05 ENCOUNTER — Other Ambulatory Visit: Payer: Self-pay | Admitting: Internal Medicine

## 2015-05-05 ENCOUNTER — Encounter: Payer: Self-pay | Admitting: Internal Medicine

## 2015-05-05 ENCOUNTER — Encounter (HOSPITAL_COMMUNITY)
Admission: RE | Admit: 2015-05-05 | Discharge: 2015-05-05 | Disposition: A | Discharge status: Home / Self Care | Payer: Medicare Other | Source: Ambulatory Visit | Attending: Nephrology | Admitting: Nephrology

## 2015-05-05 VITALS — BP 112/60 | HR 60 | Temp 97.5°F | Ht 65.0 in | Wt 136.5 lb

## 2015-05-05 DIAGNOSIS — I208 Other forms of angina pectoris: Secondary | ICD-10-CM | POA: Diagnosis not present

## 2015-05-05 DIAGNOSIS — D631 Anemia in chronic kidney disease: Secondary | ICD-10-CM | POA: Diagnosis not present

## 2015-05-05 DIAGNOSIS — F0150 Vascular dementia without behavioral disturbance: Secondary | ICD-10-CM | POA: Insufficient documentation

## 2015-05-05 DIAGNOSIS — I779 Disorder of arteries and arterioles, unspecified: Secondary | ICD-10-CM | POA: Diagnosis not present

## 2015-05-05 DIAGNOSIS — Z0000 Encounter for general adult medical examination without abnormal findings: Secondary | ICD-10-CM | POA: Diagnosis not present

## 2015-05-05 DIAGNOSIS — N183 Chronic kidney disease, stage 3 unspecified: Secondary | ICD-10-CM

## 2015-05-05 DIAGNOSIS — I700 Atherosclerosis of aorta: Secondary | ICD-10-CM | POA: Diagnosis not present

## 2015-05-05 DIAGNOSIS — I350 Nonrheumatic aortic (valve) stenosis: Secondary | ICD-10-CM | POA: Insufficient documentation

## 2015-05-05 DIAGNOSIS — IMO0001 Reserved for inherently not codable concepts without codable children: Secondary | ICD-10-CM

## 2015-05-05 DIAGNOSIS — I739 Peripheral vascular disease, unspecified: Secondary | ICD-10-CM

## 2015-05-05 DIAGNOSIS — R7309 Other abnormal glucose: Secondary | ICD-10-CM

## 2015-05-05 LAB — COMPREHENSIVE METABOLIC PANEL WITH GFR
ALT: 11 U/L (ref 0–53)
AST: 15 U/L (ref 0–37)
Albumin: 3.9 g/dL (ref 3.5–5.2)
Alkaline Phosphatase: 185 U/L — ABNORMAL HIGH (ref 39–117)
BUN: 35 mg/dL — ABNORMAL HIGH (ref 6–23)
CO2: 30 meq/L (ref 19–32)
Calcium: 9.6 mg/dL (ref 8.4–10.5)
Chloride: 102 meq/L (ref 96–112)
Creatinine, Ser: 1.66 mg/dL — ABNORMAL HIGH (ref 0.40–1.50)
GFR: 50.4 mL/min — ABNORMAL LOW
Glucose, Bld: 74 mg/dL (ref 70–99)
Potassium: 4.4 meq/L (ref 3.5–5.1)
Sodium: 139 meq/L (ref 135–145)
Total Bilirubin: 0.5 mg/dL (ref 0.2–1.2)
Total Protein: 7.1 g/dL (ref 6.0–8.3)

## 2015-05-05 LAB — LIPID PANEL
Cholesterol: 149 mg/dL (ref 0–200)
HDL: 52.3 mg/dL
LDL Cholesterol: 67 mg/dL (ref 0–99)
NonHDL: 96.36
Total CHOL/HDL Ratio: 3
Triglycerides: 145 mg/dL (ref 0.0–149.0)
VLDL: 29 mg/dL (ref 0.0–40.0)

## 2015-05-05 LAB — POCT HEMOGLOBIN-HEMACUE: Hemoglobin: 11.6 g/dL — ABNORMAL LOW (ref 13.0–17.0)

## 2015-05-05 MED ORDER — EPOETIN ALFA 20000 UNIT/ML IJ SOLN
INTRAMUSCULAR | Status: AC
  Filled 2015-05-05: qty 1

## 2015-05-05 MED ORDER — EPOETIN ALFA 20000 UNIT/ML IJ SOLN
20000.0000 [IU] | INTRAMUSCULAR | Status: DC
  Administered 2015-05-05: 20000 [IU] via SUBCUTANEOUS

## 2015-05-05 NOTE — Assessment & Plan Note (Signed)
Mild but slight progression On appropriate preventative meds Doing exercise, etc now Wife supervises but still able to drive locally, etc

## 2015-05-05 NOTE — Progress Notes (Signed)
Pre visit review using our clinic review tool, if applicable. No additional management support is needed unless otherwise documented below in the visit note. 

## 2015-05-05 NOTE — Assessment & Plan Note (Signed)
I have personally reviewed the Medicare Annual Wellness questionnaire and have noted 1. The patient's medical and social history 2. Their use of alcohol, tobacco or illicit drugs 3. Their current medications and supplements 4. The patient's functional ability including ADL's, fall risks, home safety risks and hearing or visual             impairment. 5. Diet and physical activities 6. Evidence for depression or mood disorders  The patients weight, height, BMI and visual acuity have been recorded in the chart I have made referrals, counseling and provided education to the patient based review of the above and I have provided the pt with a written personalized care plan for preventive services.  I have provided you with a copy of your personalized plan for preventive services. Please take the time to review along with your updated medication list.  UTD on imms No cancer screening due to age Working on exercise, socialization, etc

## 2015-05-05 NOTE — Progress Notes (Signed)
Subjective:    Patient ID: Anthony Werner, male    DOB: 1924/07/28, 82 y.o.   MRN: [id number]  HPI Here for Medicare wellness and follow up of chronic health issues Reviewed form and advanced directives Reviewed other doctors No alcohol or tobacco Hearing remains poor Some right eye blurriness, left is okay No falls Rare depressed mood--not persistent. Not anhedonic Now doing regular exercise. Mostly able to do instrumental ADLs Ongoing memory issues  Continues to get regular hemoglobins Still gets the erythropoeitin frequently (though they state "iron shot") Renal function is stable as far as he knows  Gets tightness in chest with walking--- resolves when he stops Doesn't use sublingual nitro No chest pain at rest Breathing gets short with activity-- no apparent change recently Rare edema No dizziness or syncope  No neuro symptoms No aphasia, facial droop, focal weakness, etc  Memory issues seem a bit worse per wife Still does some light yard work (pruning shrubs, etc) Hasn't given up any household tasks--but occasionally needs reminding  Current Outpatient Prescriptions on File Prior to Visit  Medication Sig Dispense Refill  . amLODipine (NORVASC) 10 MG tablet Take 10 mg by mouth daily.    Marland Kitchen aspirin 81 MG tablet Take 81 mg by mouth daily.      Marland Kitchen b complex vitamins capsule Take 1 capsule by mouth daily. Plus  C    . carvedilol (COREG) 12.5 MG tablet TAKE 1 TABLET BY MOUTH TWICE A DAY WITH A MEAL 180 tablet 3  . cholecalciferol (VITAMIN D) 1000 UNITS tablet Take 1,000 Units by mouth daily.    . clopidogrel (PLAVIX) 75 MG tablet TAKE 1 TABLET (75 MG TOTAL) BY MOUTH DAILY. 90 tablet 2  . furosemide (LASIX) 40 MG tablet TAKE 1 TABLET (40 MG TOTAL) BY MOUTH DAILY, MAY TAKE 2ND DOSE AT LUNCHTIME IF FEET ARE SWOLLEN 180 tablet 3  . isosorbide mononitrate (IMDUR) 30 MG 24 hr tablet TAKE 1 TABLET BY MOUTH EVERY DAY 30 tablet 5  . lisinopril (PRINIVIL,ZESTRIL) 20 MG tablet TAKE 1  TABLET BY MOUTH EVERY DAY 90 tablet 1  . omeprazole (PRILOSEC) 20 MG capsule TAKE ONE CAPSULE BY MOUTH TWICE A DAY 180 capsule 3  . pravastatin (PRAVACHOL) 40 MG tablet TAKE 1 TABLET (40 MG TOTAL) BY MOUTH DAILY. 90 tablet 3  . prednisoLONE acetate (PRED FORTE) 1 % ophthalmic suspension Place 1 drop into the right eye daily.      Current Facility-Administered Medications on File Prior to Visit  Medication Dose Route Frequency Provider Last Rate Last Dose  . ferumoxytol [facility]) injection 510 mg  510 mg Intravenous Once Primitivo Gauze, MD        Allergies  Allergen Reactions  . Atenolol   . Diltiazem   . Hydrochlorothiazide   . Labetalol   . Lovastatin   . Niacin   . Simvastatin   . Terazosin   . Iron     Just had constipation    Past Medical History  Diagnosis Date  . Hypertension   . Hyperlipidemia   . Anemia of chronic disease 04/2009    hgb 8...transfusion 04/2009  . CHF (congestive heart failure) (HCC) 04/07/09    CHF while in hospital February, 2011, EF 45%  . Esophageal stricture     Dilatation and VA, / dilatation later by Dr. Arlyce Dice  . CKD (chronic kidney disease), stage II   . Carotid artery disease (HCC)     Total right carotid, 60-79% LICA Dr. Edilia Bo, July,  2011 / left carotid endarterectomy January, 2012     . Community acquired pneumonia   . GERD (gastroesophageal reflux disease)   . Gout   . BPH (benign prostatic hyperplasia)   . Impaired fasting glucose   . Ejection fraction     45% by history  . Chest pain     Nuclear, April, 2011, no scar or ischemia    Past Surgical History  Procedure Laterality Date  . Cataract extraction  2008    Right  and   Left same year  . Hernia repair  2007  . Tonsillectomy    . Esophageal dilation      x3  . Carotid endarterectomy  LEFT    03/10/2010  . Iron injections  Sept. 2013 , Aug. 2014    Every 3 weeks  . Eye surgery Right 09-11-13    Vetroretinal " Laser "    Family History  Problem Relation Age of  Onset  . Cancer Father     Prostate  . Hypertension Father   . Heart disease Father     Heart Disease before age 80  . Heart disease Brother     x2  . Hypertension Brother   . Heart attack Brother   . Hypertension Mother   . Hypertension Sister   . Peripheral vascular disease Sister   . Hypertension Daughter     Social History   Social History  . Marital Status: Married    Spouse Name: N/A  . Number of Children: 3  . Years of Education: N/A   Occupational History  . 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 Smoking status: Former Smoker    Types: Cigarettes    Quit date: 02/15/1967  . Smokeless tobacco: Never Used  . Alcohol Use: No  . Drug Use: No  . Sexual Activity: Not on file   Other Topics Concern  . Not on file   Social History Narrative   Has living will    Wife, then oldest daughter to make health care decisions Luster Landsberg(Renee)    Will accept resuscitation but no prolonged mechanical ventilation   No tube feeds if cognitively unaware   Review of Systems Sleeps well Appetite is fine Weight is stable Full dentures Wears seat belt-- still drives locally  Some pain in left 2nd finger. No other sig joint issues No rash or suspicious skin lesion Bowels are hard at times-- stool softener helps. Urine stream is slow at times. Nocturia x 2 is stable    Objective:   Physical Exam  Constitutional: He appears well-developed and well-nourished. No distress.  HENT:  Mouth/Throat: Oropharynx is clear and moist. No oropharyngeal exudate.  Neck: Normal range of motion. No thyromegaly present.  Right carotid bruit vs referred murmur  Cardiovascular: Normal rate and regular rhythm.  Exam reveals no gallop.   Feet warm but no palpable pulses Gr 3/6 systolic murmur towards base  Pulmonary/Chest: Effort normal and breath sounds normal. No respiratory distress. He has no wheezes. He has no rales.  Abdominal: Soft. There is no tenderness.  Musculoskeletal: He  exhibits no tenderness.  Trace to 1+ pedal edema only  Lymphadenopathy:    He has no cervical adenopathy.  Neurological: He is alert.  Has 2 t-shirts on--wife surprised "February, ?"  Skin: No rash noted. No erythema.  Psychiatric: He has a normal mood and affect. His behavior is normal.          Assessment & Plan:

## 2015-05-05 NOTE — Assessment & Plan Note (Signed)
Right side disease now No CVA symptoms Keeps up with vascular

## 2015-05-05 NOTE — Assessment & Plan Note (Signed)
No recent change in functional status Discussed 911 for persistent symptoms

## 2015-05-05 NOTE — Assessment & Plan Note (Signed)
Continues with EPO and follow up with nephrologist On ACEI

## 2015-05-05 NOTE — Assessment & Plan Note (Signed)
No symptoms Will hold off on echo at this point

## 2015-05-06 ENCOUNTER — Other Ambulatory Visit: Payer: Self-pay | Admitting: Cardiology

## 2015-05-13 ENCOUNTER — Other Ambulatory Visit: Payer: Self-pay | Admitting: Cardiology

## 2015-05-19 ENCOUNTER — Encounter (HOSPITAL_COMMUNITY)
Admission: RE | Admit: 2015-05-19 | Discharge: 2015-05-19 | Disposition: A | Discharge status: Home / Self Care | Payer: Medicare Other | Source: Ambulatory Visit | Attending: Nephrology | Admitting: Nephrology

## 2015-05-19 DIAGNOSIS — D631 Anemia in chronic kidney disease: Secondary | ICD-10-CM | POA: Diagnosis present

## 2015-05-19 DIAGNOSIS — N183 Chronic kidney disease, stage 3 (moderate): Secondary | ICD-10-CM | POA: Insufficient documentation

## 2015-05-19 LAB — FERRITIN: FERRITIN: 883 ng/mL — AB (ref 24–336)

## 2015-05-19 LAB — IRON AND TIBC
IRON: 64 ug/dL (ref 45–182)
Saturation Ratios: 27 % (ref 17.9–39.5)
TIBC: 235 ug/dL — AB (ref 250–450)
UIBC: 171 ug/dL

## 2015-05-19 LAB — POCT HEMOGLOBIN-HEMACUE: Hemoglobin: 12.6 g/dL — ABNORMAL LOW (ref 13.0–17.0)

## 2015-05-19 MED ORDER — EPOETIN ALFA 20000 UNIT/ML IJ SOLN: 20000.0000 [IU] | INTRAMUSCULAR | Status: DC

## 2015-05-28 ENCOUNTER — Other Ambulatory Visit (INDEPENDENT_AMBULATORY_CARE_PROVIDER_SITE_OTHER): Payer: Medicare Other

## 2015-05-28 DIAGNOSIS — R7309 Other abnormal glucose: Secondary | ICD-10-CM

## 2015-05-28 LAB — GLUCOSE, RANDOM: Glucose, Bld: 105 mg/dL — ABNORMAL HIGH (ref 70–99)

## 2015-05-28 LAB — HEMOGLOBIN A1C: Hgb A1c MFr Bld: 6.2 % (ref 4.6–6.5)

## 2015-06-02 ENCOUNTER — Encounter (HOSPITAL_COMMUNITY)
Admission: RE | Admit: 2015-06-02 | Discharge: 2015-06-02 | Disposition: A | Discharge status: Home / Self Care | Payer: Medicare Other | Source: Ambulatory Visit | Attending: Nephrology | Admitting: Nephrology

## 2015-06-02 DIAGNOSIS — D631 Anemia in chronic kidney disease: Secondary | ICD-10-CM | POA: Diagnosis not present

## 2015-06-02 LAB — POCT HEMOGLOBIN-HEMACUE: HEMOGLOBIN: 10.7 g/dL — AB (ref 13.0–17.0)

## 2015-06-02 MED ORDER — EPOETIN ALFA 20000 UNIT/ML IJ SOLN
20000.0000 [IU] | INTRAMUSCULAR | Status: DC
  Administered 2015-06-02: 20000 [IU] via SUBCUTANEOUS

## 2015-06-02 MED ORDER — EPOETIN ALFA 20000 UNIT/ML IJ SOLN
INTRAMUSCULAR | Status: AC
  Filled 2015-06-02: qty 1

## 2015-06-16 ENCOUNTER — Encounter (HOSPITAL_COMMUNITY)
Admission: RE | Admit: 2015-06-16 | Discharge: 2015-06-16 | Disposition: A | Discharge status: Home / Self Care | Payer: Medicare Other | Source: Ambulatory Visit | Attending: Nephrology | Admitting: Nephrology

## 2015-06-16 DIAGNOSIS — N183 Chronic kidney disease, stage 3 (moderate): Secondary | ICD-10-CM | POA: Diagnosis not present

## 2015-06-16 DIAGNOSIS — D631 Anemia in chronic kidney disease: Secondary | ICD-10-CM | POA: Insufficient documentation

## 2015-06-16 MED ORDER — EPOETIN ALFA 20000 UNIT/ML IJ SOLN
INTRAMUSCULAR | Status: AC
  Filled 2015-06-16: qty 1

## 2015-06-16 MED ORDER — EPOETIN ALFA 20000 UNIT/ML IJ SOLN
20000.0000 [IU] | INTRAMUSCULAR | Status: DC
  Administered 2015-06-16: 20000 [IU] via SUBCUTANEOUS

## 2015-06-17 LAB — POCT HEMOGLOBIN-HEMACUE: Hemoglobin: 11 g/dL — ABNORMAL LOW (ref 13.0–17.0)

## 2015-06-30 ENCOUNTER — Encounter (HOSPITAL_COMMUNITY)
Admission: RE | Admit: 2015-06-30 | Discharge: 2015-06-30 | Disposition: A | Discharge status: Home / Self Care | Payer: Medicare Other | Source: Ambulatory Visit | Attending: Nephrology | Admitting: Nephrology

## 2015-06-30 DIAGNOSIS — D631 Anemia in chronic kidney disease: Secondary | ICD-10-CM | POA: Diagnosis not present

## 2015-06-30 LAB — POCT HEMOGLOBIN-HEMACUE: Hemoglobin: 10.6 g/dL — ABNORMAL LOW (ref 13.0–17.0)

## 2015-06-30 MED ORDER — EPOETIN ALFA 20000 UNIT/ML IJ SOLN
INTRAMUSCULAR | Status: AC
  Filled 2015-06-30: qty 1

## 2015-06-30 MED ORDER — EPOETIN ALFA 20000 UNIT/ML IJ SOLN
20000.0000 [IU] | INTRAMUSCULAR | Status: DC
  Administered 2015-06-30: 20000 [IU] via SUBCUTANEOUS

## 2015-07-14 ENCOUNTER — Encounter (HOSPITAL_COMMUNITY)
Admission: RE | Admit: 2015-07-14 | Discharge: 2015-07-14 | Disposition: A | Discharge status: Home / Self Care | Payer: Medicare Other | Source: Ambulatory Visit | Attending: Nephrology | Admitting: Nephrology

## 2015-07-14 DIAGNOSIS — D631 Anemia in chronic kidney disease: Secondary | ICD-10-CM | POA: Diagnosis not present

## 2015-07-14 LAB — POCT HEMOGLOBIN-HEMACUE: HEMOGLOBIN: 11 g/dL — AB (ref 13.0–17.0)

## 2015-07-14 MED ORDER — EPOETIN ALFA 20000 UNIT/ML IJ SOLN
20000.0000 [IU] | INTRAMUSCULAR | Status: DC
  Administered 2015-07-14: 20000 [IU] via SUBCUTANEOUS

## 2015-07-14 MED ORDER — EPOETIN ALFA 20000 UNIT/ML IJ SOLN
INTRAMUSCULAR | Status: AC
  Administered 2015-07-14: 20000 [IU] via SUBCUTANEOUS
  Filled 2015-07-14: qty 1

## 2015-07-16 ENCOUNTER — Encounter: Payer: Self-pay | Admitting: Cardiovascular Disease

## 2015-07-16 ENCOUNTER — Ambulatory Visit (INDEPENDENT_AMBULATORY_CARE_PROVIDER_SITE_OTHER): Payer: Medicare Other | Admitting: Cardiovascular Disease

## 2015-07-16 VITALS — BP 146/80 | HR 57 | Ht 65.0 in | Wt 134.8 lb

## 2015-07-16 DIAGNOSIS — I779 Disorder of arteries and arterioles, unspecified: Secondary | ICD-10-CM | POA: Diagnosis not present

## 2015-07-16 DIAGNOSIS — I739 Peripheral vascular disease, unspecified: Principal | ICD-10-CM

## 2015-07-16 DIAGNOSIS — I208 Other forms of angina pectoris: Secondary | ICD-10-CM

## 2015-07-16 DIAGNOSIS — I10 Essential (primary) hypertension: Secondary | ICD-10-CM | POA: Diagnosis not present

## 2015-07-16 NOTE — Patient Instructions (Signed)

## 2015-07-16 NOTE — Progress Notes (Signed)
Cardiology Office Note   Date:  07/16/2015   ID:  Anthony Werner, DOB 05/17/1924, MRN [id number]  PCP:  Tillman Abideichard Letvak, MD  Cardiologist:  Kristeen MissPhilip Addison Whidbee, MD   Chief Complaint  Patient presents with  . Follow-up    HTN   Problem list 1. Essential hypertension 2. Carotid artery disease   History of Present Illness: Anthony Werner is a 82 y.o. male who presents to follow-up general cardiology issues. He has significant carotid disease that is followed by vascular surgery. He's had some mild shortness of breath over time but he's been quite stable. He is not having any significant chest pain. He had one episode of CHF when hospitalized with anemia in 2011. Otherwise he's been quite stable. He did have a nuclear study in 2011 showing no scar or ischemia.  07/16/2015:  Seen with sife, Vanentine today . BP has been well controlled.  120s-130s . Still active but is limited by his lower back pain .    Still does some light garden and yard work .   Is retired from the post office.  No CP or dyspnea   Past Medical History  Diagnosis Date  . Hypertension   . Hyperlipidemia   . Anemia of chronic disease 04/2009    hgb 8...transfusion 04/2009  . CHF (congestive heart failure) (HCC) 04/07/09    CHF while in hospital February, 2011, EF 45%  . Esophageal stricture     Dilatation and VA, / dilatation later by Dr. Arlyce DiceKaplan  . CKD (chronic kidney disease), stage II   . Carotid artery disease (HCC)     Total right carotid, 60-79% LICA Dr. Edilia Bodickson, July, 2011 / left carotid endarterectomy January, 2012     . Community acquired pneumonia   . GERD (gastroesophageal reflux disease)   . Gout   . BPH (benign prostatic hyperplasia)   . Impaired fasting glucose   . Ejection fraction     45% by history  . Chest pain     Nuclear, April, 2011, no scar or ischemia    Past Surgical History  Procedure Laterality Date  . Cataract extraction  2008    Right  and   Left same year  . Hernia repair   2007  . Tonsillectomy    . Esophageal dilation      x3  . Carotid endarterectomy  LEFT    03/10/2010  . Iron injections  Sept. 2013 , Aug. 2014    Every 3 weeks  . Eye surgery Right 09-11-13    Vetroretinal " Laser "    Patient Active Problem List   Diagnosis Date Noted  . Chronic stable angina (HCC) 05/05/2015  . Aortic sclerosis (HCC) 05/05/2015  . Vascular dementia without behavioral disturbance 05/05/2015  . Advance directive discussed with patient 04/30/2014  . Shortness of breath 11/06/2013  . Swelling of limb 11/06/2013  . Edema 08/13/2013  . Routine general medical examination at a health care facility 04/22/2013  . Mild cognitive impairment 04/22/2013  . Allergic rhinitis due to pollen 10/22/2012  . Occlusion and stenosis of carotid artery without mention of cerebral infarction 10/19/2011  . 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 Hypertension   . Hyperlipidemia   . Carotid artery disease (HCC)   . GERD (gastroesophageal reflux disease)   . Ejection fraction   . Chest pain   . GOUT 07/15/2009  . BENIGN PROSTATIC HYPERTROPHY 07/15/2009  . CHRONIC KIDNEY DISEASE STAGE III (MODERATE) 04/30/2009  . ESOPHAGEAL STRICTURE 04/15/2009  .  Anemia of chronic disease 04/14/2009      Current Outpatient Prescriptions  Medication Sig Dispense Refill  . amLODipine (NORVASC) 10 MG tablet Take 10 mg by mouth daily.    Marland Kitchen aspirin 81 MG tablet Take 81 mg by mouth daily.      Marland Kitchen b complex vitamins capsule Take 1 capsule by mouth daily. Plus  C    . carvedilol (COREG) 12.5 MG tablet TAKE 1 TABLET BY MOUTH TWICE A DAY WITH A MEAL 180 tablet 3  . cholecalciferol (VITAMIN D) 1000 UNITS tablet Take 1,000 Units by mouth daily.    . clopidogrel (PLAVIX) 75 MG tablet TAKE 1 TABLET (75 MG TOTAL) BY MOUTH DAILY. 90 tablet 0  . furosemide (LASIX) 40 MG tablet TAKE 1 TABLET (40 MG TOTAL) BY MOUTH DAILY, MAY TAKE 2ND DOSE AT LUNCHTIME IF FEET ARE SWOLLEN 180 tablet 3  . isosorbide mononitrate (IMDUR)  30 MG 24 hr tablet TAKE 1 TABLET BY MOUTH EVERY DAY 30 tablet 5  . lisinopril (PRINIVIL,ZESTRIL) 20 MG tablet TAKE 1 TABLET BY MOUTH EVERY DAY 90 tablet 0  . omeprazole (PRILOSEC) 20 MG capsule TAKE ONE CAPSULE BY MOUTH TWICE A DAY 180 capsule 3  . pravastatin (PRAVACHOL) 40 MG tablet TAKE 1 TABLET (40 MG TOTAL) BY MOUTH DAILY. 90 tablet 3  . prednisoLONE acetate (PRED FORTE) 1 % ophthalmic suspension Place 1 drop into the right eye daily.      No current facility-administered medications for this visit.   Facility-Administered Medications Ordered in Other Visits  Medication Dose Route Frequency Provider Last Rate Last Dose  . ferumoxytol [facility]) injection 510 mg  510 mg Intravenous Once Primitivo Gauze, MD        Allergies:   Atenolol; Diltiazem; Hydrochlorothiazide; Labetalol; Lovastatin; Niacin; Simvastatin; Terazosin; and Iron    Social History:  The patient  reports that he quit smoking about 48 years ago. His smoking use included Cigarettes. He has never used smokeless tobacco. He reports that he does not drink alcohol or use illicit drugs.   Family History:  The patient's family history includes Cancer in his father; Heart attack in his brother; Heart disease in his brother and father; Hypertension in his brother, daughter, father, mother, and sister; Peripheral vascular disease in his sister.    ROS:  Please see the history of present illness.   Patient denies fever, chills, headache, sweats, rash, change in vision, change in hearing, chest pain, cough, nausea or vomiting, urinary symptoms. All other systems are reviewed and are negative.     PHYSICAL EXAM: VS:  BP 146/80 mmHg  Pulse 57  Ht  (1.651 m)  Wt 134 lb 12.8 oz (61.145 kg)  BMI 22.43 kg/m2 , Patient looks quite good. He is here with his wife. He is oriented to person time and place. Affect is normal. Head is atraumatic. Sclera and conjunctiva are normal. There is no jugulovenous distention. Carotids - loud  right carotid bruit,  Very soft left carotid bruit.   Lungs are clear. Respiratory effort is nonlabored.  Cardiac exam reveals S1 and S2. There is a soft systolic murmur radiating to the axilla .  The abdomen is soft. His peripheral edema.  There are no musculoskeletal deformities. There are no skin rashes. Neurologic is grossly intact.  EKG:   EKG is done today and reviewed by me. T Sinus bradycardia 57. He has nonspecific T-wave abnormalities.   Recent Labs: 05/05/2015: ALT 11; BUN 35*; Creatinine, Ser 1.66*; Potassium 4.4; Sodium  139 07/14/2015: Hemoglobin 11.0*    Lipid Panel    Component Value Date/Time   CHOL 149 05/05/2015 0945   TRIG 145.0 05/05/2015 0945   HDL 52.30 05/05/2015 0945   CHOLHDL 3 05/05/2015 0945   VLDL 29.0 05/05/2015 0945   LDLCALC 67 05/05/2015 0945      Wt Readings from Last 3 Encounters:  07/16/15 134 lb 12.8 oz (61.145 kg)  05/05/15 136 lb 8 oz (61.916 kg)  11/05/14 133 lb 1.6 oz (60.374 kg)      Current medicines are reviewed  Patient understands his medications.   ASSESSMENT AND PLAN:  1. Essential hypertension - he still eats a little bit of extra salt. He still eats hot dogs on a regular basis. I've encouraged him to stay with hot dogs.  2. Carotid artery disease- followed by Dr. Edilia Bo.  I will see him in 1 year     Kristeen Miss, MD  07/16/2015 9:19 AM    [facility] [address] [place],  [address] [place], [place]  [address] Pager [phone] Phone: [phone]; Fax: [phone]

## 2015-07-23 ENCOUNTER — Ambulatory Visit: Payer: Medicare Other | Admitting: Interventional Cardiology

## 2015-07-28 ENCOUNTER — Encounter (HOSPITAL_COMMUNITY)
Admission: RE | Admit: 2015-07-28 | Discharge: 2015-07-28 | Disposition: A | Discharge status: Home / Self Care | Payer: Medicare Other | Source: Ambulatory Visit | Attending: Nephrology | Admitting: Nephrology

## 2015-07-28 DIAGNOSIS — D631 Anemia in chronic kidney disease: Secondary | ICD-10-CM | POA: Insufficient documentation

## 2015-07-28 DIAGNOSIS — N183 Chronic kidney disease, stage 3 (moderate): Secondary | ICD-10-CM | POA: Diagnosis not present

## 2015-07-28 LAB — POCT HEMOGLOBIN-HEMACUE: Hemoglobin: 10.7 g/dL — ABNORMAL LOW (ref 13.0–17.0)

## 2015-07-28 MED ORDER — EPOETIN ALFA 20000 UNIT/ML IJ SOLN
20000.0000 [IU] | INTRAMUSCULAR | Status: DC
  Administered 2015-07-28: 20000 [IU] via SUBCUTANEOUS

## 2015-07-28 MED ORDER — EPOETIN ALFA 20000 UNIT/ML IJ SOLN
INTRAMUSCULAR | Status: AC
  Filled 2015-07-28: qty 1

## 2015-08-04 ENCOUNTER — Other Ambulatory Visit: Payer: Self-pay | Admitting: Cardiology

## 2015-08-08 ENCOUNTER — Other Ambulatory Visit: Payer: Self-pay | Admitting: Cardiology

## 2015-08-11 ENCOUNTER — Encounter (HOSPITAL_COMMUNITY)
Admission: RE | Admit: 2015-08-11 | Discharge: 2015-08-11 | Disposition: A | Discharge status: Home / Self Care | Payer: Medicare Other | Source: Ambulatory Visit | Attending: Nephrology | Admitting: Nephrology

## 2015-08-11 DIAGNOSIS — D631 Anemia in chronic kidney disease: Secondary | ICD-10-CM | POA: Diagnosis not present

## 2015-08-11 LAB — POCT HEMOGLOBIN-HEMACUE: HEMOGLOBIN: 10.6 g/dL — AB (ref 13.0–17.0)

## 2015-08-11 MED ORDER — EPOETIN ALFA 20000 UNIT/ML IJ SOLN
20000.0000 [IU] | INTRAMUSCULAR | Status: DC
  Administered 2015-08-11: 20000 [IU] via SUBCUTANEOUS

## 2015-08-11 MED ORDER — EPOETIN ALFA 20000 UNIT/ML IJ SOLN
INTRAMUSCULAR | Status: AC
  Filled 2015-08-11: qty 1

## 2015-08-12 ENCOUNTER — Encounter: Payer: Self-pay | Admitting: Podiatry

## 2015-08-12 ENCOUNTER — Ambulatory Visit (INDEPENDENT_AMBULATORY_CARE_PROVIDER_SITE_OTHER): Payer: Medicare Other | Admitting: Podiatry

## 2015-08-12 DIAGNOSIS — B351 Tinea unguium: Secondary | ICD-10-CM | POA: Diagnosis not present

## 2015-08-12 DIAGNOSIS — M79674 Pain in right toe(s): Secondary | ICD-10-CM

## 2015-08-12 DIAGNOSIS — M79675 Pain in left toe(s): Secondary | ICD-10-CM | POA: Diagnosis not present

## 2015-08-12 NOTE — Progress Notes (Signed)
Patient ID: Anthony Werner, male   DOB: 11/05/1924, 82 y.o.   MRN: [id number]  Subjective: This patient presents today for scheduled visit complaining of elongated and thickened uncomfortable toenails when walking wearing shoes and requests toenail debridement  Pleasant orientated 3  Vascular: No peripheral edema noted bilaterally DP 1/4 bilaterally PT pulses nonpalpable bilaterally Capillary reflex immediate bilaterally  Neurological: Sensation to 10 g monofilament wire intact 5/5 bilaterally Vibratory sensation reactive bilaterally Ankle reflex equal and reactive bilaterally  Dermatological: Atrophic skin without any open skin lesions bilaterally The toenails are elongated, incurvated, deformed, discolored with maximum deformity in the right hallux nail. Some of the lesser toenails are incurvated in embedding to the distal toes on the right and left feet  Musculoskeletal: Pes planus bilaterally HAV bilaterally There is no restriction ankle, subtalar, midtarsal joints bilaterally      Assessment & Plan:   Assessment: Decrease pedal pulses bilaterally without history of claudication or open wounds Satisfactory logical status Neglected symptomatic onychomycoses 6-10  Plan: Debridement toenails 6-10 mechanically electrically. Small bleeding distal fourth right toe treated with antibiotic ointment and Band-Aid. Patient advised to move Band-Aid 1-3 days on the fourth right toe and continue to apply topical antibiotic ointment and Band-Aid until a scab forms  Reappoint 3-4 months or at patient's request

## 2015-08-12 NOTE — Patient Instructions (Signed)
Removed Band-Aid on the fourth right toe 1-3 days and apply topical antibiotic ointment and Band-Aid until a scab forms

## 2015-08-25 ENCOUNTER — Encounter (HOSPITAL_COMMUNITY)
Admission: RE | Admit: 2015-08-25 | Discharge: 2015-08-25 | Disposition: A | Discharge status: Home / Self Care | Payer: Medicare Other | Source: Ambulatory Visit | Attending: Nephrology | Admitting: Nephrology

## 2015-08-25 DIAGNOSIS — D631 Anemia in chronic kidney disease: Secondary | ICD-10-CM | POA: Insufficient documentation

## 2015-08-25 DIAGNOSIS — N183 Chronic kidney disease, stage 3 (moderate): Secondary | ICD-10-CM | POA: Insufficient documentation

## 2015-08-25 LAB — IRON AND TIBC
IRON: 51 ug/dL (ref 45–182)
Saturation Ratios: 20 % (ref 17.9–39.5)
TIBC: 249 ug/dL — ABNORMAL LOW (ref 250–450)
UIBC: 198 ug/dL

## 2015-08-25 LAB — FERRITIN: Ferritin: 741 ng/mL — ABNORMAL HIGH (ref 24–336)

## 2015-08-25 LAB — POCT HEMOGLOBIN-HEMACUE: Hemoglobin: 11.3 g/dL — ABNORMAL LOW (ref 13.0–17.0)

## 2015-08-25 MED ORDER — EPOETIN ALFA 20000 UNIT/ML IJ SOLN
INTRAMUSCULAR | Status: AC
  Filled 2015-08-25: qty 1

## 2015-08-25 MED ORDER — EPOETIN ALFA 20000 UNIT/ML IJ SOLN
20000.0000 [IU] | INTRAMUSCULAR | Status: DC
  Administered 2015-08-25: 20000 [IU] via SUBCUTANEOUS

## 2015-08-27 ENCOUNTER — Other Ambulatory Visit: Payer: Self-pay | Admitting: Internal Medicine

## 2015-08-27 ENCOUNTER — Telehealth: Payer: Self-pay

## 2015-08-27 MED ORDER — PANTOPRAZOLE SODIUM 40 MG PO TBEC: 40.0000 mg | DELAYED_RELEASE_TABLET | Freq: Every day | ORAL | Status: AC

## 2015-08-27 NOTE — Telephone Encounter (Signed)
Yes, thank him for the heads up. Change to protonix 40mg  daily 1 year Rx

## 2015-08-27 NOTE — Telephone Encounter (Signed)
Discontinued omeprazole and sent in Protonix

## 2015-08-27 NOTE — Telephone Encounter (Signed)
[facility] requesting cb about possible drug interaction between omeprazole and plavix; do you want to change med or fill as prescribed. Travis request cb.

## 2015-08-30 ENCOUNTER — Other Ambulatory Visit: Payer: Self-pay | Admitting: Cardiology

## 2015-08-31 NOTE — Telephone Encounter (Signed)
Mrs Anthony [place] signed left v/m wanting to know why changed omeprazole 20 mg to Protonix 40 mg; Mrs Anthony Werner notified as instructed from this note and she voiced understanding. Will cb if needed.

## 2015-09-05 ENCOUNTER — Other Ambulatory Visit: Payer: Self-pay | Admitting: Internal Medicine

## 2015-09-07 ENCOUNTER — Other Ambulatory Visit: Payer: Self-pay | Admitting: Internal Medicine

## 2015-09-08 ENCOUNTER — Encounter (HOSPITAL_COMMUNITY)
Admission: RE | Admit: 2015-09-08 | Discharge: 2015-09-08 | Disposition: A | Discharge status: Home / Self Care | Payer: Medicare Other | Source: Ambulatory Visit | Attending: Nephrology | Admitting: Nephrology

## 2015-09-08 DIAGNOSIS — D631 Anemia in chronic kidney disease: Secondary | ICD-10-CM | POA: Diagnosis not present

## 2015-09-08 DIAGNOSIS — N183 Chronic kidney disease, stage 3 unspecified: Secondary | ICD-10-CM

## 2015-09-08 LAB — POCT HEMOGLOBIN-HEMACUE: HEMOGLOBIN: 11.7 g/dL — AB (ref 13.0–17.0)

## 2015-09-08 MED ORDER — EPOETIN ALFA 20000 UNIT/ML IJ SOLN
20000.0000 [IU] | INTRAMUSCULAR | Status: DC
  Administered 2015-09-08: 20000 [IU] via SUBCUTANEOUS

## 2015-09-08 MED ORDER — EPOETIN ALFA 20000 UNIT/ML IJ SOLN
INTRAMUSCULAR | Status: AC
  Filled 2015-09-08: qty 1

## 2015-09-15 ENCOUNTER — Telehealth: Payer: Self-pay | Admitting: Internal Medicine

## 2015-09-15 DIAGNOSIS — H9190 Unspecified hearing loss, unspecified ear: Secondary | ICD-10-CM

## 2015-09-15 NOTE — Telephone Encounter (Signed)
Spouse called stating pt needs a referral to [facility] in [place] [address] So his insurance will pay for hearing test so he can get hearing aids fax [phone]

## 2015-09-15 NOTE — Telephone Encounter (Signed)
Referral placed.

## 2015-09-22 ENCOUNTER — Encounter (HOSPITAL_COMMUNITY)
Admission: RE | Admit: 2015-09-22 | Discharge: 2015-09-22 | Disposition: A | Discharge status: Home / Self Care | Payer: Medicare Other | Source: Ambulatory Visit | Attending: Nephrology | Admitting: Nephrology

## 2015-09-22 DIAGNOSIS — D631 Anemia in chronic kidney disease: Secondary | ICD-10-CM | POA: Insufficient documentation

## 2015-09-22 DIAGNOSIS — N183 Chronic kidney disease, stage 3 unspecified: Secondary | ICD-10-CM

## 2015-09-22 LAB — POCT HEMOGLOBIN-HEMACUE: Hemoglobin: 11.5 g/dL — ABNORMAL LOW (ref 13.0–17.0)

## 2015-09-22 MED ORDER — EPOETIN ALFA 20000 UNIT/ML IJ SOLN
20000.0000 [IU] | INTRAMUSCULAR | Status: DC
  Administered 2015-09-22: 20000 [IU] via SUBCUTANEOUS

## 2015-09-22 MED ORDER — EPOETIN ALFA 20000 UNIT/ML IJ SOLN
INTRAMUSCULAR | Status: AC
  Filled 2015-09-22: qty 1

## 2015-10-06 ENCOUNTER — Encounter (HOSPITAL_COMMUNITY)
Admission: RE | Admit: 2015-10-06 | Discharge: 2015-10-06 | Disposition: A | Discharge status: Home / Self Care | Payer: Medicare Other | Source: Ambulatory Visit | Attending: Nephrology | Admitting: Nephrology

## 2015-10-06 DIAGNOSIS — N183 Chronic kidney disease, stage 3 unspecified: Secondary | ICD-10-CM

## 2015-10-06 DIAGNOSIS — D631 Anemia in chronic kidney disease: Secondary | ICD-10-CM | POA: Diagnosis not present

## 2015-10-06 LAB — POCT HEMOGLOBIN-HEMACUE: HEMOGLOBIN: 11.9 g/dL — AB (ref 13.0–17.0)

## 2015-10-06 MED ORDER — EPOETIN ALFA 20000 UNIT/ML IJ SOLN
20000.0000 [IU] | INTRAMUSCULAR | Status: DC
  Administered 2015-10-06: 20000 [IU] via SUBCUTANEOUS

## 2015-10-06 MED ORDER — EPOETIN ALFA 20000 UNIT/ML IJ SOLN
INTRAMUSCULAR | Status: AC
  Filled 2015-10-06: qty 1

## 2015-10-20 ENCOUNTER — Encounter (HOSPITAL_COMMUNITY)
Admission: RE | Admit: 2015-10-20 | Discharge: 2015-10-20 | Disposition: A | Discharge status: Home / Self Care | Payer: Medicare Other | Source: Ambulatory Visit | Attending: Nephrology | Admitting: Nephrology

## 2015-10-20 DIAGNOSIS — N183 Chronic kidney disease, stage 3 unspecified: Secondary | ICD-10-CM

## 2015-10-20 DIAGNOSIS — D631 Anemia in chronic kidney disease: Secondary | ICD-10-CM | POA: Insufficient documentation

## 2015-10-20 LAB — POCT HEMOGLOBIN-HEMACUE: Hemoglobin: 13 g/dL (ref 13.0–17.0)

## 2015-10-20 MED ORDER — EPOETIN ALFA 20000 UNIT/ML IJ SOLN: 20000.0000 [IU] | INTRAMUSCULAR | Status: DC

## 2015-11-03 ENCOUNTER — Encounter (HOSPITAL_COMMUNITY): Payer: Medicare Other

## 2015-11-06 ENCOUNTER — Encounter: Payer: Self-pay | Admitting: Family

## 2015-11-10 ENCOUNTER — Encounter (HOSPITAL_COMMUNITY)
Admission: RE | Admit: 2015-11-10 | Discharge: 2015-11-10 | Disposition: A | Discharge status: Home / Self Care | Payer: Medicare Other | Source: Ambulatory Visit | Attending: Nephrology | Admitting: Nephrology

## 2015-11-10 DIAGNOSIS — N183 Chronic kidney disease, stage 3 unspecified: Secondary | ICD-10-CM

## 2015-11-10 DIAGNOSIS — D631 Anemia in chronic kidney disease: Secondary | ICD-10-CM | POA: Diagnosis not present

## 2015-11-10 LAB — POCT HEMOGLOBIN-HEMACUE: Hemoglobin: 10.8 g/dL — ABNORMAL LOW (ref 13.0–17.0)

## 2015-11-10 MED ORDER — EPOETIN ALFA 20000 UNIT/ML IJ SOLN
INTRAMUSCULAR | Status: AC
  Filled 2015-11-10: qty 1

## 2015-11-10 MED ORDER — EPOETIN ALFA 20000 UNIT/ML IJ SOLN
20000.0000 [IU] | INTRAMUSCULAR | Status: DC
  Administered 2015-11-10: 20000 [IU] via SUBCUTANEOUS

## 2015-11-11 ENCOUNTER — Ambulatory Visit (INDEPENDENT_AMBULATORY_CARE_PROVIDER_SITE_OTHER): Payer: Medicare Other | Admitting: Family

## 2015-11-11 ENCOUNTER — Encounter: Payer: Self-pay | Admitting: Family

## 2015-11-11 ENCOUNTER — Ambulatory Visit (HOSPITAL_COMMUNITY)
Admission: RE | Admit: 2015-11-11 | Discharge: 2015-11-11 | Disposition: A | Discharge status: Home / Self Care | Payer: Medicare Other | Source: Ambulatory Visit | Attending: Vascular Surgery | Admitting: Vascular Surgery

## 2015-11-11 VITALS — BP 165/69 | HR 61 | Temp 97.3°F | Resp 16 | Ht 65.0 in | Wt 133.0 lb

## 2015-11-11 DIAGNOSIS — I6521 Occlusion and stenosis of right carotid artery: Secondary | ICD-10-CM | POA: Diagnosis not present

## 2015-11-11 DIAGNOSIS — I6523 Occlusion and stenosis of bilateral carotid arteries: Secondary | ICD-10-CM

## 2015-11-11 DIAGNOSIS — Z87891 Personal history of nicotine dependence: Secondary | ICD-10-CM | POA: Diagnosis not present

## 2015-11-11 DIAGNOSIS — I6529 Occlusion and stenosis of unspecified carotid artery: Secondary | ICD-10-CM | POA: Diagnosis present

## 2015-11-11 DIAGNOSIS — I6522 Occlusion and stenosis of left carotid artery: Secondary | ICD-10-CM | POA: Diagnosis not present

## 2015-11-11 DIAGNOSIS — Z9889 Other specified postprocedural states: Secondary | ICD-10-CM

## 2015-11-11 LAB — VAS US CAROTID
LCCAPDIAS: 13 cm/s
LEFT ECA DIAS: 0 cm/s
LICADDIAS: -16 cm/s
LICADSYS: -86 cm/s
LICAPDIAS: 21 cm/s
Left CCA dist dias: 11 cm/s
Left CCA dist sys: 104 cm/s
Left CCA prox sys: 102 cm/s
Left ICA prox sys: 112 cm/s
RCCAPDIAS: 0 cm/s
RIGHT CCA MID DIAS: 1 cm/s
RIGHT ECA DIAS: -5 cm/s
RIGHT VERTEBRAL DIAS: 10 cm/s
Right CCA prox sys: 44 cm/s

## 2015-11-11 NOTE — Patient Instructions (Signed)
Stroke Prevention Some medical conditions and behaviors are associated with an increased chance of having a stroke. You may prevent a stroke by making healthy choices and managing medical conditions. HOW CAN I REDUCE MY RISK OF HAVING A STROKE?   Stay physically active. Get at least 30 minutes of activity on most or all days.  Do not smoke. It may also be helpful to avoid exposure to secondhand smoke.  Limit alcohol use. Moderate alcohol use is considered to be:  No more than 2 drinks per day for men.  No more than 1 drink per day for nonpregnant women.  Eat healthy foods. This involves:  Eating 5 or more servings of fruits and vegetables a day.  Making dietary changes that address high blood pressure (hypertension), high cholesterol, diabetes, or obesity.  Manage your cholesterol levels.  Making food choices that are high in fiber and low in saturated fat, trans fat, and cholesterol may control cholesterol levels.  Take any prescribed medicines to control cholesterol as directed by your health care provider.  Manage your diabetes.  Controlling your carbohydrate and sugar intake is recommended to manage diabetes.  Take any prescribed medicines to control diabetes as directed by your health care provider.  Control your hypertension.  Making food choices that are low in salt (sodium), saturated fat, trans fat, and cholesterol is recommended to manage hypertension.  Ask your health care provider if you need treatment to lower your blood pressure. Take any prescribed medicines to control hypertension as directed by your health care provider.  If you are 18-39 years of age, have your blood pressure checked every 3-5 years. If you are 40 years of age or older, have your blood pressure checked every year.  Maintain a healthy weight.  Reducing calorie intake and making food choices that are low in sodium, saturated fat, trans fat, and cholesterol are recommended to manage  weight.  Stop drug abuse.  Avoid taking birth control pills.  Talk to your health care provider about the risks of taking birth control pills if you are over 35 years old, smoke, get migraines, or have ever had a blood clot.  Get evaluated for sleep disorders (sleep apnea).  Talk to your health care provider about getting a sleep evaluation if you snore a lot or have excessive sleepiness.  Take medicines only as directed by your health care provider.  For some people, aspirin or blood thinners (anticoagulants) are helpful in reducing the risk of forming abnormal blood clots that can lead to stroke. If you have the irregular heart rhythm of atrial fibrillation, you should be on a blood thinner unless there is a good reason you cannot take them.  Understand all your medicine instructions.  Make sure that other conditions (such as anemia or atherosclerosis) are addressed. SEEK IMMEDIATE MEDICAL CARE IF:   You have sudden weakness or numbness of the face, arm, or leg, especially on one side of the body.  Your face or eyelid droops to one side.  You have sudden confusion.  You have trouble speaking (aphasia) or understanding.  You have sudden trouble seeing in one or both eyes.  You have sudden trouble walking.  You have dizziness.  You have a loss of balance or coordination.  You have a sudden, severe headache with no known cause.  You have new chest pain or an irregular heartbeat. Any of these symptoms may represent a serious problem that is an emergency. Do not wait to see if the symptoms will   go away. Get medical help at once. Call your local emergency services (911 in [place] Do not drive yourself to the hospital.   This information is not intended to replace advice given to you by your health care provider. Make sure you discuss any questions you have with your health care provider.   Document Released: 03/10/2004 Document Revised: 02/21/2014 Document Reviewed:  08/03/2012 Elsevier Interactive Patient Education 2016 Elsevier Inc.  

## 2015-11-11 NOTE — Progress Notes (Signed)
Chief Complaint: Follow up Extracranial Carotid Artery Stenosis   History of Present Illness  Anthony Werner is a 82 y.o. male who is s/p left carotid endarterectomy in 2012 by Dr. Edilia Bo. 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 Previous carotid Duplex showed no evidence of significant recurrent carotid stenosis on the left side. He has a known right internal carotid artery occlusion.  He returns today for Duplex carotid artery surveillance.  Patient denies ever having a stroke or TIA's before or after his CEA.  Specifically he denies amaurosis fugax or monocular blindness, denies unilateral facial drooping, denies hemiplegia, denies receptive or expressive aphasia.   He denies claudication symptoms with walking.  Patient reports New Medical or Surgical History: none.  He has a history of right eye surgery, having to do with the vitreous.   PMHx is significant for CHF which has resolved, he denies history of MI.   Pt Diabetic: No  Pt smoker: former smoker, quit 1979, smoked for 20 years   Pt meds include:  Statin : Yes  Betablocker: Yes  ASA: Yes  Other anticoagulants/antiplatelets: Plavix    Past Medical History:  Diagnosis Date  . Anemia of chronic disease 04/2009   hgb 8...transfusion 04/2009  . BPH (benign prostatic hyperplasia)   . Carotid artery disease (HCC)    Total right carotid, 60-79% LICA Dr. Edilia Bo, July, 2011 / left carotid endarterectomy January, 2012     . Chest pain    Nuclear, April, 2011, no scar or ischemia  . CHF (congestive heart failure) (HCC) 04/07/09   CHF while in hospital February, 2011, EF 45%  . CKD (chronic kidney disease), stage II   . Community acquired pneumonia   . Ejection fraction    45% by history  . Esophageal stricture    Dilatation and VA, / dilatation later by Dr. Arlyce Dice  . GERD (gastroesophageal reflux disease)   . Gout   . Hyperlipidemia   . Hypertension   . Impaired fasting glucose      Social History Social History  Substance Use Topics  . Smoking status: Former Smoker    Types: Cigarettes    Quit date: 02/15/1967  . Smokeless tobacco: Never Used  . Alcohol use No    Family History Family History  Problem Relation Age of Onset  . Cancer Father     Prostate  . Hypertension Father   . Heart disease Father     Heart Disease before age 57  . Heart disease Brother     x2  . Hypertension Brother   . Heart attack Brother   . Hypertension Mother   . Hypertension Sister   . Peripheral vascular disease Sister   . Hypertension Daughter     Surgical History Past Surgical History:  Procedure Laterality Date  . CAROTID ENDARTERECTOMY  LEFT   03/10/2010  . CATARACT EXTRACTION  2008   Right  and   Left same year  . ESOPHAGEAL DILATION     x3  . EYE SURGERY Right 09-11-13   Vetroretinal " Laser "  . HERNIA REPAIR  2007  . Iron Injections  Sept. 2013 , Aug. 2014   Every 3 weeks  . TONSILLECTOMY      Allergies  Allergen Reactions  . Atenolol   . Diltiazem   . Hydrochlorothiazide   . Labetalol   . Lovastatin   . Niacin   . Simvastatin   . Terazosin   . Iron  Just had constipation    Current Outpatient Prescriptions  Medication Sig Dispense Refill  . amLODipine (NORVASC) 10 MG tablet Take 10 mg by mouth daily.    Marland Kitchen aspirin 81 MG tablet Take 81 mg by mouth daily.      Marland Kitchen b complex vitamins capsule Take 1 capsule by mouth daily. Plus  C    . carvedilol (COREG) 12.5 MG tablet TAKE 1 TABLET BY MOUTH TWICE A DAY WITH A MEAL 180 tablet 3  . cholecalciferol (VITAMIN D) 1000 UNITS tablet Take 1,000 Units by mouth daily.    . clopidogrel (PLAVIX) 75 MG tablet TAKE 1 TABLET (75 MG TOTAL) BY MOUTH DAILY. 90 tablet 3  . furosemide (LASIX) 40 MG tablet TAKE 1 TABLET (40 MG TOTAL) BY MOUTH DAILY, MAY TAKE 2ND DOSE AT LUNCHTIME IF FEET ARE SWOLLEN 180 tablet 1  . isosorbide mononitrate (IMDUR) 30 MG 24 hr tablet TAKE 1 TABLET BY MOUTH EVERY DAY 90 tablet 3  .  lisinopril (PRINIVIL,ZESTRIL) 20 MG tablet TAKE 1 TABLET BY MOUTH EVERY DAY 90 tablet 3  . pantoprazole (PROTONIX) 40 MG tablet Take 1 tablet (40 mg total) by mouth daily. 90 tablet 3  . pravastatin (PRAVACHOL) 40 MG tablet TAKE 1 TABLET (40 MG TOTAL) BY MOUTH DAILY. 90 tablet 2  . prednisoLONE acetate (PRED FORTE) 1 % ophthalmic suspension Place 1 drop into the right eye daily.      No current facility-administered medications for this visit.    Facility-Administered Medications Ordered in Other Visits  Medication Dose Route Frequency Provider Last Rate Last Dose  . ferumoxytol [facility]) injection 510 mg  510 mg Intravenous Once Primitivo Gauze, MD        Review of Systems : See HPI for pertinent positives and negatives.  Physical Examination  Vitals:   11/11/15 1130 11/11/15 1131  BP: (!) 173/74 (!) 165/69  Pulse: 61   Resp: 16   Temp: 97.3 F (36.3 C)   TempSrc: Oral   SpO2: 93%   Weight: 133 lb (60.3 kg)   Height: 5\' 5"  (1.651 m)    Body mass index is 22.13 kg/m.  General: WDWN male in NAD  GAIT: normal  Eyes: PERRLA  Pulmonary: CTAB, no rales, no rhonchi, & no wheezing.  Cardiac: regular rhythm and rate, no murmur detected.  VASCULAR EXAM  Carotid Bruits  Left  Right    negative positive    Radial pulses are 2+ palpable and equal. Aorta is not palpable.  LE Pulses  LEFT  RIGHT   POPLITEAL  not palpable  not palpable   POSTERIOR TIBIAL  not palpable  not palpable   DORSALIS PEDIS  ANTERIOR TIBIAL  Not palpable  Not palpable    Gastrointestinal: soft, nontender, BS WNL, no r/g, no palpated mass.  Musculoskeletal: No muscle atrophy/wasting. M/S 5/5 throughout, extremities without ischemic changes.  Neurologic: A&O X 3; Appropriate Affect, Speech is normal  CN 2-12 intact, Pain and light touch intact in extremities, Motor exam as listed above.    Assessment: Anthony Werner is a 82 y.o. male who is s/p left carotid  endarterectomy in 2012 by Dr. Edilia Bo. 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 Pt has no history of stroke or TIA.  I advised him to see his PCP as soon as possible to address his elevated blood pressure.  He denies headache or chest pain, he reports dyspnea only when walking for a while; he states he has discussed this  with his PCP and cardiologist.   DATA Today's carotid duplex suggests known occlusion of the right internal carotid artery, right external carotid artery stenosis, and a patent left carotid endarterectomy without evidence for restenosis. No change since exams of 11/06/2013 and 11/05/14   Plan: Follow-up in 1 year with Carotid Duplex scan.   I discussed in depth with the patient the nature of atherosclerosis, and emphasized the importance of maximal medical management including strict control of blood pressure, blood glucose, and lipid levels, obtaining regular exercise, and continued cessation of smoking.  The patient is aware that without maximal medical management the underlying atherosclerotic disease process will progress, limiting the benefit of any interventions. The patient was given information about stroke prevention and what symptoms should prompt the patient to seek immediate medical care. Thank you for allowing us to participate in this patient's care.  Charisse MarchSuzanne Jonathon Tan, RN, MSN, FNP-C [facility] Office: [phone]  Clinic Physician: Edilia BoDickson  11/11/15 11:38 AM

## 2015-11-14 ENCOUNTER — Other Ambulatory Visit: Payer: Self-pay | Admitting: Cardiology

## 2015-11-17 NOTE — Addendum Note (Signed)
Addended by: Yolonda KidaEVANS, ASHLEIGH N on: 11/17/2015 11:29 AM   Modules accepted: Orders

## 2015-11-18 ENCOUNTER — Encounter: Payer: Self-pay | Admitting: Podiatry

## 2015-11-18 ENCOUNTER — Ambulatory Visit (INDEPENDENT_AMBULATORY_CARE_PROVIDER_SITE_OTHER): Payer: Medicare Other | Admitting: Podiatry

## 2015-11-18 VITALS — BP 137/61 | HR 60 | Resp 18

## 2015-11-18 DIAGNOSIS — M79674 Pain in right toe(s): Secondary | ICD-10-CM

## 2015-11-18 DIAGNOSIS — M79675 Pain in left toe(s): Secondary | ICD-10-CM | POA: Diagnosis not present

## 2015-11-18 DIAGNOSIS — B351 Tinea unguium: Secondary | ICD-10-CM

## 2015-11-18 NOTE — Progress Notes (Signed)
Patient ID: Jac CanavanHarold J Poer, male   DOB: 11/23/1924, [age] y.o.   MRN: [id number]    Subjective: This patient presents today for scheduled visit complaining of elongated and thickened uncomfortable toenails when walking wearing shoes and requests toenail debridement     Pleasant orientated 3  Vascular: DP 1/4 bilaterally PT pulses nonpalpable bilaterally Capillary reflex immediate bilaterally  Neurological: Sensation to 10 g monofilament wire intact 5/5 bilaterally Vibratory sensation reactive bilaterally Ankle reflex equal and reactive bilaterally  Dermatological: Atrophic skin without any open skin lesions bilaterally The toenails are elongated, incurvated, deformed, discolored with maximum deformity in the right hallux nail. Some of the lesser toenails are incurvated in embedding to the distal toes on the right and left feet  Musculoskeletal: Pes planus bilaterally HAV bilaterally There is no restriction ankle, subtalar, midtarsal     Assessment: Decrease pedal pulses bilaterally without any history of claudication or open wounds Satisfactory neurovascular status Symptomatic onychomycoses 6-10  Plan: Debridement of toenails 6-10 mechanically and electrically without any bleeding  Reappoint 3 months

## 2015-12-01 ENCOUNTER — Encounter (HOSPITAL_COMMUNITY)
Admission: RE | Admit: 2015-12-01 | Discharge: 2015-12-01 | Disposition: A | Discharge status: Home / Self Care | Payer: Medicare Other | Source: Ambulatory Visit | Attending: Nephrology | Admitting: Nephrology

## 2015-12-01 DIAGNOSIS — D631 Anemia in chronic kidney disease: Secondary | ICD-10-CM | POA: Insufficient documentation

## 2015-12-01 DIAGNOSIS — N183 Chronic kidney disease, stage 3 unspecified: Secondary | ICD-10-CM

## 2015-12-01 LAB — IRON AND TIBC
IRON: 91 ug/dL (ref 45–182)
Saturation Ratios: 37 % (ref 17.9–39.5)
TIBC: 248 ug/dL — AB (ref 250–450)
UIBC: 157 ug/dL

## 2015-12-01 LAB — POCT HEMOGLOBIN-HEMACUE: Hemoglobin: 9.7 g/dL — ABNORMAL LOW (ref 13.0–17.0)

## 2015-12-01 LAB — FERRITIN: Ferritin: 883 ng/mL — ABNORMAL HIGH (ref 24–336)

## 2015-12-01 MED ORDER — EPOETIN ALFA 20000 UNIT/ML IJ SOLN: 20000.0000 [IU] | INTRAMUSCULAR | Status: DC

## 2015-12-01 MED ORDER — EPOETIN ALFA 20000 UNIT/ML IJ SOLN
INTRAMUSCULAR | Status: AC
Start: 2015-12-01 — End: 2015-12-01
  Administered 2015-12-01: 20000 [IU] via SUBCUTANEOUS
  Filled 2015-12-01: qty 1

## 2015-12-22 ENCOUNTER — Encounter (HOSPITAL_COMMUNITY)
Admission: RE | Admit: 2015-12-22 | Discharge: 2015-12-22 | Disposition: A | Discharge status: Home / Self Care | Payer: Medicare Other | Source: Ambulatory Visit | Attending: Nephrology | Admitting: Nephrology

## 2015-12-22 DIAGNOSIS — N183 Chronic kidney disease, stage 3 unspecified: Secondary | ICD-10-CM

## 2015-12-22 DIAGNOSIS — D631 Anemia in chronic kidney disease: Secondary | ICD-10-CM | POA: Insufficient documentation

## 2015-12-22 LAB — POCT HEMOGLOBIN-HEMACUE: Hemoglobin: 9.9 g/dL — ABNORMAL LOW (ref 13.0–17.0)

## 2015-12-22 MED ORDER — EPOETIN ALFA 20000 UNIT/ML IJ SOLN
INTRAMUSCULAR | Status: AC
  Filled 2015-12-22: qty 1

## 2015-12-22 MED ORDER — EPOETIN ALFA 20000 UNIT/ML IJ SOLN
20000.0000 [IU] | INTRAMUSCULAR | Status: DC
  Administered 2015-12-22: 20000 [IU] via SUBCUTANEOUS

## 2016-01-11 ENCOUNTER — Other Ambulatory Visit (HOSPITAL_COMMUNITY): Payer: Self-pay | Admitting: *Deleted

## 2016-01-12 ENCOUNTER — Encounter (HOSPITAL_COMMUNITY)
Admission: RE | Admit: 2016-01-12 | Discharge: 2016-01-12 | Disposition: A | Discharge status: Home / Self Care | Payer: Medicare Other | Source: Ambulatory Visit | Attending: Nephrology | Admitting: Nephrology

## 2016-01-12 DIAGNOSIS — N183 Chronic kidney disease, stage 3 unspecified: Secondary | ICD-10-CM

## 2016-01-12 DIAGNOSIS — D631 Anemia in chronic kidney disease: Secondary | ICD-10-CM | POA: Diagnosis not present

## 2016-01-12 LAB — POCT HEMOGLOBIN-HEMACUE: Hemoglobin: 9.6 g/dL — ABNORMAL LOW (ref 13.0–17.0)

## 2016-01-12 MED ORDER — EPOETIN ALFA 20000 UNIT/ML IJ SOLN
20000.0000 [IU] | INTRAMUSCULAR | Status: DC
  Administered 2016-01-12: 20000 [IU] via SUBCUTANEOUS

## 2016-01-12 MED ORDER — EPOETIN ALFA 20000 UNIT/ML IJ SOLN
INTRAMUSCULAR | Status: AC
  Filled 2016-01-12: qty 1

## 2016-01-18 ENCOUNTER — Encounter (HOSPITAL_COMMUNITY): Payer: Self-pay | Admitting: Emergency Medicine

## 2016-01-18 ENCOUNTER — Emergency Department (HOSPITAL_COMMUNITY): Payer: Medicare Other

## 2016-01-18 ENCOUNTER — Inpatient Hospital Stay (HOSPITAL_COMMUNITY)
Admission: EM | Admit: 2016-01-18 | Discharge: 2016-02-15 | DRG: 246 | Disposition: E | Payer: Medicare Other | Attending: Internal Medicine | Admitting: Internal Medicine

## 2016-01-18 DIAGNOSIS — I10 Essential (primary) hypertension: Secondary | ICD-10-CM | POA: Diagnosis present

## 2016-01-18 DIAGNOSIS — N179 Acute kidney failure, unspecified: Secondary | ICD-10-CM | POA: Diagnosis not present

## 2016-01-18 DIAGNOSIS — Z79899 Other long term (current) drug therapy: Secondary | ICD-10-CM

## 2016-01-18 DIAGNOSIS — R570 Cardiogenic shock: Secondary | ICD-10-CM

## 2016-01-18 DIAGNOSIS — Z87891 Personal history of nicotine dependence: Secondary | ICD-10-CM

## 2016-01-18 DIAGNOSIS — R7301 Impaired fasting glucose: Secondary | ICD-10-CM | POA: Diagnosis present

## 2016-01-18 DIAGNOSIS — N183 Chronic kidney disease, stage 3 unspecified: Secondary | ICD-10-CM | POA: Diagnosis present

## 2016-01-18 DIAGNOSIS — N400 Enlarged prostate without lower urinary tract symptoms: Secondary | ICD-10-CM | POA: Diagnosis present

## 2016-01-18 DIAGNOSIS — M109 Gout, unspecified: Secondary | ICD-10-CM | POA: Diagnosis present

## 2016-01-18 DIAGNOSIS — Z888 Allergy status to other drugs, medicaments and biological substances status: Secondary | ICD-10-CM

## 2016-01-18 DIAGNOSIS — I739 Peripheral vascular disease, unspecified: Secondary | ICD-10-CM | POA: Diagnosis present

## 2016-01-18 DIAGNOSIS — Z66 Do not resuscitate: Secondary | ICD-10-CM | POA: Diagnosis not present

## 2016-01-18 DIAGNOSIS — Z91018 Allergy to other foods: Secondary | ICD-10-CM

## 2016-01-18 DIAGNOSIS — I779 Disorder of arteries and arterioles, unspecified: Secondary | ICD-10-CM | POA: Diagnosis present

## 2016-01-18 DIAGNOSIS — I340 Nonrheumatic mitral (valve) insufficiency: Secondary | ICD-10-CM | POA: Diagnosis present

## 2016-01-18 DIAGNOSIS — I5043 Acute on chronic combined systolic (congestive) and diastolic (congestive) heart failure: Secondary | ICD-10-CM | POA: Diagnosis present

## 2016-01-18 DIAGNOSIS — Z8249 Family history of ischemic heart disease and other diseases of the circulatory system: Secondary | ICD-10-CM

## 2016-01-18 DIAGNOSIS — I214 Non-ST elevation (NSTEMI) myocardial infarction: Secondary | ICD-10-CM | POA: Diagnosis not present

## 2016-01-18 DIAGNOSIS — Z7902 Long term (current) use of antithrombotics/antiplatelets: Secondary | ICD-10-CM

## 2016-01-18 DIAGNOSIS — R739 Hyperglycemia, unspecified: Secondary | ICD-10-CM | POA: Diagnosis present

## 2016-01-18 DIAGNOSIS — I350 Nonrheumatic aortic (valve) stenosis: Secondary | ICD-10-CM | POA: Diagnosis present

## 2016-01-18 DIAGNOSIS — R0602 Shortness of breath: Secondary | ICD-10-CM

## 2016-01-18 DIAGNOSIS — J9601 Acute respiratory failure with hypoxia: Secondary | ICD-10-CM | POA: Diagnosis not present

## 2016-01-18 DIAGNOSIS — J9600 Acute respiratory failure, unspecified whether with hypoxia or hypercapnia: Secondary | ICD-10-CM

## 2016-01-18 DIAGNOSIS — D638 Anemia in other chronic diseases classified elsewhere: Secondary | ICD-10-CM | POA: Diagnosis present

## 2016-01-18 DIAGNOSIS — I480 Paroxysmal atrial fibrillation: Secondary | ICD-10-CM

## 2016-01-18 DIAGNOSIS — I429 Cardiomyopathy, unspecified: Secondary | ICD-10-CM | POA: Diagnosis present

## 2016-01-18 DIAGNOSIS — I130 Hypertensive heart and chronic kidney disease with heart failure and stage 1 through stage 4 chronic kidney disease, or unspecified chronic kidney disease: Secondary | ICD-10-CM | POA: Diagnosis present

## 2016-01-18 DIAGNOSIS — N189 Chronic kidney disease, unspecified: Secondary | ICD-10-CM

## 2016-01-18 DIAGNOSIS — J811 Chronic pulmonary edema: Secondary | ICD-10-CM

## 2016-01-18 DIAGNOSIS — J9690 Respiratory failure, unspecified, unspecified whether with hypoxia or hypercapnia: Secondary | ICD-10-CM

## 2016-01-18 DIAGNOSIS — N184 Chronic kidney disease, stage 4 (severe): Secondary | ICD-10-CM | POA: Diagnosis present

## 2016-01-18 DIAGNOSIS — I6521 Occlusion and stenosis of right carotid artery: Secondary | ICD-10-CM | POA: Diagnosis present

## 2016-01-18 DIAGNOSIS — R579 Shock, unspecified: Secondary | ICD-10-CM

## 2016-01-18 DIAGNOSIS — E785 Hyperlipidemia, unspecified: Secondary | ICD-10-CM | POA: Diagnosis present

## 2016-01-18 DIAGNOSIS — Z7982 Long term (current) use of aspirin: Secondary | ICD-10-CM

## 2016-01-18 DIAGNOSIS — R451 Restlessness and agitation: Secondary | ICD-10-CM | POA: Diagnosis present

## 2016-01-18 DIAGNOSIS — R34 Anuria and oliguria: Secondary | ICD-10-CM | POA: Diagnosis not present

## 2016-01-18 DIAGNOSIS — I25110 Atherosclerotic heart disease of native coronary artery with unstable angina pectoris: Secondary | ICD-10-CM | POA: Diagnosis present

## 2016-01-18 DIAGNOSIS — L8990 Pressure ulcer of unspecified site, unspecified stage: Secondary | ICD-10-CM | POA: Insufficient documentation

## 2016-01-18 DIAGNOSIS — F0150 Vascular dementia without behavioral disturbance: Secondary | ICD-10-CM | POA: Diagnosis present

## 2016-01-18 DIAGNOSIS — I4891 Unspecified atrial fibrillation: Secondary | ICD-10-CM | POA: Diagnosis present

## 2016-01-18 DIAGNOSIS — Z809 Family history of malignant neoplasm, unspecified: Secondary | ICD-10-CM

## 2016-01-18 DIAGNOSIS — G3184 Mild cognitive impairment, so stated: Secondary | ICD-10-CM | POA: Diagnosis present

## 2016-01-18 DIAGNOSIS — K219 Gastro-esophageal reflux disease without esophagitis: Secondary | ICD-10-CM | POA: Diagnosis present

## 2016-01-18 HISTORY — DX: Non-ST elevation (NSTEMI) myocardial infarction: I21.4

## 2016-01-18 LAB — BRAIN NATRIURETIC PEPTIDE: B NATRIURETIC PEPTIDE 5: 1530.4 pg/mL — AB (ref 0.0–100.0)

## 2016-01-18 LAB — CBC
HEMATOCRIT: 31.6 % — AB (ref 39.0–52.0)
Hemoglobin: 10.2 g/dL — ABNORMAL LOW (ref 13.0–17.0)
MCH: 30.3 pg (ref 26.0–34.0)
MCHC: 32.3 g/dL (ref 30.0–36.0)
MCV: 93.8 fL (ref 78.0–100.0)
PLATELETS: 223 10*3/uL (ref 150–400)
RBC: 3.37 MIL/uL — AB (ref 4.22–5.81)
RDW: 16.4 % — ABNORMAL HIGH (ref 11.5–15.5)
WBC: 7.4 10*3/uL (ref 4.0–10.5)

## 2016-01-18 LAB — I-STAT TROPONIN, ED: Troponin i, poc: 1.28 ng/mL (ref 0.00–0.08)

## 2016-01-18 LAB — BASIC METABOLIC PANEL
Anion gap: 12 (ref 5–15)
BUN: 61 mg/dL — ABNORMAL HIGH (ref 6–20)
CHLORIDE: 102 mmol/L (ref 101–111)
CO2: 22 mmol/L (ref 22–32)
CREATININE: 2.24 mg/dL — AB (ref 0.61–1.24)
Calcium: 9.2 mg/dL (ref 8.9–10.3)
GFR calc non Af Amer: 24 mL/min — ABNORMAL LOW (ref >60)
GFR, EST AFRICAN AMERICAN: 28 mL/min — AB (ref >60)
Glucose, Bld: 172 mg/dL — ABNORMAL HIGH (ref 65–99)
POTASSIUM: 4.6 mmol/L (ref 3.5–5.1)
Sodium: 136 mmol/L (ref 135–145)

## 2016-01-18 LAB — I-STAT CG4 LACTIC ACID, ED: LACTIC ACID, VENOUS: 2.03 mmol/L — AB (ref 0.5–1.9)

## 2016-01-18 MED ORDER — HEPARIN (PORCINE) IN NACL 100-0.45 UNIT/ML-% IJ SOLN
1150.0000 [IU]/h | INTRAMUSCULAR | Status: DC
  Administered 2016-01-18: 750 [IU]/h via INTRAVENOUS
  Filled 2016-01-18 (×3): qty 250

## 2016-01-18 MED ORDER — SODIUM CHLORIDE 0.9 % IV BOLUS (SEPSIS)
500.0000 mL | Freq: Once | INTRAVENOUS | Status: AC
  Administered 2016-01-18: 500 mL via INTRAVENOUS

## 2016-01-18 MED ORDER — ASPIRIN 81 MG PO CHEW
324.0000 mg | CHEWABLE_TABLET | Freq: Once | ORAL | Status: AC
  Administered 2016-01-18: 324 mg via ORAL
  Filled 2016-01-18: qty 4

## 2016-01-18 MED ORDER — HEPARIN BOLUS VIA INFUSION
3500.0000 [IU] | Freq: Once | INTRAVENOUS | Status: AC
  Administered 2016-01-18: 3500 [IU] via INTRAVENOUS
  Filled 2016-01-18: qty 3500

## 2016-01-18 MED ORDER — FENTANYL CITRATE (PF) 100 MCG/2ML IJ SOLN
50.0000 ug | Freq: Once | INTRAMUSCULAR | Status: AC
  Administered 2016-01-18: 50 ug via INTRAVENOUS
  Filled 2016-01-18: qty 2

## 2016-01-18 MED ORDER — NITROGLYCERIN IN D5W 200-5 MCG/ML-% IV SOLN
5.0000 ug/min | INTRAVENOUS | Status: DC
  Administered 2016-01-18: 5 ug/min via INTRAVENOUS
  Administered 2016-01-19 (×2): 100 ug/min via INTRAVENOUS
  Filled 2016-01-18 (×3): qty 250

## 2016-01-18 NOTE — ED Provider Notes (Addendum)
By signing my name below, I, Linna DarnerRussell Turner, attest that this documentation has been prepared under the direction and in the presence of physician practitioner, Layla MawKristen N Jonael Paradiso, DO. Electronically Signed: Linna Darnerussell Turner, Scribe. 02/11/2016. 11:13 PM.  TIME SEEN: 11:13 PM  CHIEF COMPLAINT: Shortness of Breath  HPI: HPI Comments: LEVEL 5 CAVEAT FOR POOR HISTORY Jac CanavanHarold J Hauge is a 82 y.o. male brought in by family, with PMHx significant for carotid artery disease, CHF, HTN, HLD, and A-Fib but not on anticoagulation, who presents to the Emergency Department complaining of intermittent, worsening, SOB for the last couple of weeks. He reports associated chest pain and chest tightness. He states his symptoms are worse with ambulation and exertion. Pt also reports a mild productive cough with white sputum over the last couple of weeks. He states he is experiencing mild SOB, chest pain, and chest tightness currently but has improved but not completely resolved. Patient has had diaphoresis and lightheadedness. He is not on blood thinners. No h/o coronary stents. He and his family members are unsure if he has h/o stress test or cardiac catheterization. It appears per our notes he had his last stress Myoview in October 2012. He denies fever, chills, nausea, vomiting or any other associated symptoms.  ROS: See HPI Constitutional: no fever or chills Eyes: no drainage  ENT: no runny nose   Cardiovascular:  chest pain Resp: SOB GI: no vomiting GU: no dysuria Integumentary: no rash  Allergy: no hives  Musculoskeletal: no leg swelling  Neurological: no slurred speech ROS otherwise negative  PAST MEDICAL HISTORY/PAST SURGICAL HISTORY:  Past Medical History:  Diagnosis Date  . Anemia of chronic disease 04/2009   hgb 8...transfusion 04/2009  . BPH (benign prostatic hyperplasia)   . Carotid artery disease (HCC)    Total right carotid, 60-79% LICA Dr. Edilia Bodickson, July, 2011 / left carotid endarterectomy January,  2012     . Chest pain    Nuclear, April, 2011, no scar or ischemia  . CHF (congestive heart failure) (HCC) 04/07/09   CHF while in hospital February, 2011, EF 45%  . CKD (chronic kidney disease), stage II   . Community acquired pneumonia   . Ejection fraction    45% by history  . Esophageal stricture    Dilatation and VA, / dilatation later by Dr. Arlyce DiceKaplan  . GERD (gastroesophageal reflux disease)   . Gout   . Hyperlipidemia   . Hypertension   . Impaired fasting glucose     MEDICATIONS:  Prior to Admission medications   Medication Sig Start Date End Date Taking? Authorizing Provider  amLODipine (NORVASC) 10 MG tablet Take 10 mg by mouth daily.    Historical Provider, MD  aspirin 81 MG tablet Take 81 mg by mouth daily.      Historical Provider, MD  b complex vitamins capsule Take 1 capsule by mouth daily. Plus  C    Historical Provider, MD  carvedilol (COREG) 12.5 MG tablet TAKE 1 TABLET BY MOUTH TWICE A DAY WITH A MEAL 11/16/15   Vesta MixerPhilip J Nahser, MD  cholecalciferol (VITAMIN D) 1000 UNITS tablet Take 1,000 Units by mouth daily.    Historical Provider, MD  clopidogrel (PLAVIX) 75 MG tablet TAKE 1 TABLET (75 MG TOTAL) BY MOUTH DAILY. 08/10/15   Vesta MixerPhilip J Nahser, MD  furosemide (LASIX) 40 MG tablet TAKE 1 TABLET (40 MG TOTAL) BY MOUTH DAILY, MAY TAKE 2ND DOSE AT LUNCHTIME IF FEET ARE SWOLLEN 09/07/15   Karie Schwalbeichard I Letvak, MD  isosorbide mononitrate (  IMDUR) 30 MG 24 hr tablet TAKE 1 TABLET BY MOUTH EVERY DAY 08/31/15   Vesta MixerPhilip J Nahser, MD  lisinopril (PRINIVIL,ZESTRIL) 20 MG tablet TAKE 1 TABLET BY MOUTH EVERY DAY 08/04/15   Vesta MixerPhilip J Nahser, MD  pantoprazole (PROTONIX) 40 MG tablet Take 1 tablet (40 mg total) by mouth daily. 08/27/15   Karie Schwalbeichard I Letvak, MD  pravastatin (PRAVACHOL) 40 MG tablet TAKE 1 TABLET (40 MG TOTAL) BY MOUTH DAILY. 09/07/15   Karie Schwalbeichard I Letvak, MD  prednisoLONE acetate (PRED FORTE) 1 % ophthalmic suspension Place 1 drop into the right eye daily.  10/05/11   Historical Provider, MD     ALLERGIES:  Allergies  Allergen Reactions  . Atenolol   . Diltiazem   . Hydrochlorothiazide   . Labetalol   . Lovastatin   . Niacin   . Simvastatin   . Terazosin   . Iron     Just had constipation    SOCIAL HISTORY:  Social History  Substance Use Topics  . Smoking status: Former Smoker    Types: Cigarettes    Quit date: 02/15/1967  . Smokeless tobacco: Never Used  . Alcohol use No    FAMILY HISTORY: Family History  Problem Relation Age of Onset  . Cancer Father     Prostate  . Hypertension Father   . Heart disease Father     Heart Disease before age 80  . Heart disease Brother     x2  . Hypertension Brother   . Heart attack Brother   . Hypertension Mother   . Hypertension Sister   . Peripheral vascular disease Sister   . Hypertension Daughter     EXAM: BP (!) 107/51 (BP Location: Right Arm)   Pulse 73   Temp 98.3 F (36.8 C) (Axillary)   Resp 20   SpO2 97%  CONSTITUTIONAL: Alert and oriented and responds appropriately to questions. Thin, chronically ill-appearing; well-nourished; elderly HEAD: Normocephalic EYES: Conjunctivae clear, PERRL, EOMI ENT: normal nose; no rhinorrhea; moist mucous membranes NECK: Supple, no meningismus, no nuchal rigidity, no LAD; no JVD  CARD: Irregularly irregular; S1 and S2 appreciated; no murmurs, no clicks, no rubs, no gallops RESP: Normal chest excursion without splinting or tachypnea; breath sounds clear and equal bilaterally; no wheezes, no rhonchi, no rales, no hypoxia or respiratory distress, speaking full sentences ABD/GI: Normal bowel sounds; non-distended; soft, non-tender, no rebound, no guarding, no peritoneal signs, no hepatosplenomegaly BACK:  The back appears normal and is non-tender to palpation, there is no CVA tenderness EXT: Normal ROM in all joints; non-tender to palpation; no edema; normal capillary refill; no cyanosis, no calf tenderness or swelling    SKIN: Normal color for age and race; warm; no  rash NEURO: Moves all extremities equally, sensation to light touch intact diffusely, cranial nerves II through XII intact, normal speech PSYCH: The patient's mood and manner are appropriate. Grooming and personal hygiene are appropriate.  MEDICAL DECISION MAKING: Patient seen immediately upon my arrival to the emergency department. I reviewed patient's initial EKG which was worrisome for ST elevation in aVR and ST depression in lateral leads that appeared to be worsened compared to prior EKGs. On evaluation, patient reports only mild symptoms at this time a repeat EKG shows atrial fibrillation and does not meet criteria for STEMI.  Patient's initial troponin is 1.28. Will discuss with cardiology on call. We'll give aspirin. He has slightly low blood pressure at this time.  Will give small fluid bolus and IV fentanyl to get  him pain-free.  ED PROGRESS: I paged cardiology fellow Dr. Benay Pillow at 11:21 PM.   11:34 PM D/w cardiologist on call Dr. Benay Pillow.  He is reviewed patient's EKGs and agrees that initial EKG is concerning given symptoms have improved and second EKG has resolved and is near his baseline EKG from 2011 he does not think that the patient needs to go emergently to the cardiac catheterization lab. He agrees with giving aspirin, starting nitroglycerin drip at low dose (he is aware of patient's blood pressure), IV fluid bolus, heparin. I consented patient at bedside for heparin and he has no contraindications. Cardiology fellow agrees that we should treat this patient as unstable angina and he will see the patient in the emergency department for admission. Patient and family would want cardiac catheterization. His pain is improving. Given a small dose of IV fentanyl.  Nursing staff instructed to start nitroglycerin drip at low dose and monitor BP closely.  I do not feel that the patient has pneumonia clinically. He has had no fevers, chills. No leukocytosis. Cough with white sputum production x 2 weeks  and he was a previous smoker. His lungs are clear to auscultation. I reviewed patient's chest x-ray and this may be volume overloaded. His BNP is 1500. I cannot diurese him at this time given he is slightly hypotensive. He is getting a small 500 mL IV fluid bolus. I do not feel he needs antibiotics at this time.  His lactate is minimally elevated.  I think this may be from poor perfusion from possible CHF. I do not think that his hypotension, slightly elevated lactate is from sepsis. I think this can be monitored closely.  11:45 PM  Cardiologist at bedside for admission.  Appreciate his help.  Recommends going up on NTG drip to get patient pain free. Recommends small dose beta blocker - Metoprolol 12.5 mg now.  We have both discussed with patient that if his pain becomes worse that he needs to let us know immediately as he may need to go to the cardiac catheterization lab tonight.   I reviewed all nursing notes, vitals, pertinent old records, EKGs, labs, imaging (as available).  12:30 AM  Pt pain free at 7.5 mcg/min of NTG.  HD stable.  Pt is a full code.   EKG Interpretation  Date/Time:  Monday January 18 2016 22:18:26 EST Ventricular Rate:  77 PR Interval:  248 QRS Duration: 94 QT Interval:  416 QTC Calculation: 470 R Axis:   -10 Text Interpretation:  Sinus rhythm with first degree AV block Marked ST abnormality, possible inferior subendocardial injury Abnormal ECG When compared with ECG of 03/08/2010, ST depression in Inferior leads and Anterolateral leads is now Present Confirmed by [facility]  MD, DAVID ([id number]) on 01/30/2016 10:27:09 PM       EKG Interpretation  Date/Time:  Monday January 18 2016 23:15:00 EST Ventricular Rate:  70 PR Interval:  248 QRS Duration: 98 QT Interval:  377 QTC Calculation: 407 R Axis:   -47 Text Interpretation:  Atrial fibrillation Left anterior fascicular block Repol abnrm suggests ischemia, diffuse leads Now appears similar to EKG in 2011 Confirmed by Chozen Latulippe,   DO, Randell Detter ([id number]) on 01/19/2016 11:23:38 PM        CRITICAL CARE Performed by: Raelyn Number   Total critical care time: 45 minutes - NSTEMI, on heparin and NTG drips  Critical care time was exclusive of separately billable procedures and treating other patients.  Critical care was necessary to treat or prevent  imminent or life-threatening deterioration.  Critical care was time spent personally by me on the following activities: development of treatment plan with patient and/or surrogate as well as nursing, discussions with consultants, evaluation of patient's response to treatment, examination of patient, obtaining history from patient or surrogate, ordering and performing treatments and interventions, ordering and review of laboratory studies, ordering and review of radiographic studies, pulse oximetry and re-evaluation of patient's condition.   I personally performed the services described in this documentation, which was scribed in my presence. The recorded information has been reviewed and is accurate.    Layla Maw Brennen Camper, DO 01/19/16 0003    Layla Maw Elizabethanne Lusher, DO 01/19/16 1610

## 2016-01-18 NOTE — ED Triage Notes (Signed)
Patient reports worsening SOB with productive cough worse with exertion and chest pressure for several days , denies fever or chills .

## 2016-01-18 NOTE — Progress Notes (Signed)
ANTICOAGULATION CONSULT NOTE - Initial Consult  Pharmacy Consult for Heparin Indication: chest pain/ACS  Allergies  Allergen Reactions  . Atenolol   . Diltiazem   . Hydrochlorothiazide   . Labetalol   . Lovastatin   . Niacin   . Simvastatin   . Terazosin   . Iron     Just had constipation    Patient Measurements: Height: 5\' 5"  (165.1 cm) Weight: 132 lb 4.4 oz (60 kg) IBW/kg (Calculated) : 61.5  Vital Signs: Temp: 98.3 F (36.8 C) (12/04 2221) Temp Source: Axillary (12/04 2221) BP: 95/56 (12/04 2330) Pulse Rate: 70 (12/04 2330)  Labs:  Recent Labs  01/23/2016 2224  HGB 10.2*  HCT 31.6*  PLT 223  CREATININE 2.24*    Estimated Creatinine Clearance: 19 mL/min (by C-G formula based on SCr of 2.24 mg/dL (H)).   Medical History: Past Medical History:  Diagnosis Date  . Anemia of chronic disease 04/2009   hgb 8...transfusion 04/2009  . BPH (benign prostatic hyperplasia)   . Carotid artery disease (HCC)    Total right carotid, 60-79% LICA Dr. Edilia Bodickson, July, 2011 / left carotid endarterectomy January, 2012     . Chest pain    Nuclear, April, 2011, no scar or ischemia  . CHF (congestive heart failure) (HCC) 04/07/09   CHF while in hospital February, 2011, EF 45%  . CKD (chronic kidney disease), stage II   . Community acquired pneumonia   . Ejection fraction    45% by history  . Esophageal stricture    Dilatation and VA, / dilatation later by Dr. Arlyce DiceKaplan  . GERD (gastroesophageal reflux disease)   . Gout   . Hyperlipidemia   . Hypertension   . Impaired fasting glucose     Medications:  See electronic med rec  Assessment: 82 y.o. M presents with SOB, CP. To begin heparin for r/o ACS. Trop up to 1.28. No AC PTA. Hgb low but stable on admission.  Goal of Therapy:  Heparin level 0.3-0.7 units/ml Monitor platelets by anticoagulation protocol: Yes   Plan:  Heparin IV bolus 3500 units Heparin gtt at 750 units/hr Will f/u heparin level in 8 hours Daily  heparin level and CBC  Christoper Fabianaron Toniesha Zellner, PharmD, BCPS Clinical pharmacist, pager [phone] 01/23/2016,11:34 PM

## 2016-01-19 ENCOUNTER — Encounter (HOSPITAL_COMMUNITY): Payer: Self-pay | Admitting: Internal Medicine

## 2016-01-19 ENCOUNTER — Inpatient Hospital Stay (HOSPITAL_COMMUNITY): Payer: Medicare Other

## 2016-01-19 ENCOUNTER — Ambulatory Visit: Payer: Medicare Other | Admitting: Internal Medicine

## 2016-01-19 DIAGNOSIS — I2510 Atherosclerotic heart disease of native coronary artery without angina pectoris: Secondary | ICD-10-CM | POA: Diagnosis not present

## 2016-01-19 DIAGNOSIS — K219 Gastro-esophageal reflux disease without esophagitis: Secondary | ICD-10-CM | POA: Diagnosis present

## 2016-01-19 DIAGNOSIS — I340 Nonrheumatic mitral (valve) insufficiency: Secondary | ICD-10-CM | POA: Diagnosis present

## 2016-01-19 DIAGNOSIS — I4891 Unspecified atrial fibrillation: Secondary | ICD-10-CM | POA: Diagnosis present

## 2016-01-19 DIAGNOSIS — E785 Hyperlipidemia, unspecified: Secondary | ICD-10-CM | POA: Diagnosis present

## 2016-01-19 DIAGNOSIS — J9601 Acute respiratory failure with hypoxia: Secondary | ICD-10-CM | POA: Diagnosis not present

## 2016-01-19 DIAGNOSIS — N183 Chronic kidney disease, stage 3 (moderate): Secondary | ICD-10-CM | POA: Diagnosis not present

## 2016-01-19 DIAGNOSIS — R739 Hyperglycemia, unspecified: Secondary | ICD-10-CM | POA: Diagnosis present

## 2016-01-19 DIAGNOSIS — I350 Nonrheumatic aortic (valve) stenosis: Secondary | ICD-10-CM | POA: Diagnosis present

## 2016-01-19 DIAGNOSIS — N179 Acute kidney failure, unspecified: Secondary | ICD-10-CM | POA: Diagnosis not present

## 2016-01-19 DIAGNOSIS — N184 Chronic kidney disease, stage 4 (severe): Secondary | ICD-10-CM | POA: Diagnosis present

## 2016-01-19 DIAGNOSIS — R570 Cardiogenic shock: Secondary | ICD-10-CM | POA: Diagnosis present

## 2016-01-19 DIAGNOSIS — R34 Anuria and oliguria: Secondary | ICD-10-CM | POA: Diagnosis not present

## 2016-01-19 DIAGNOSIS — I5023 Acute on chronic systolic (congestive) heart failure: Secondary | ICD-10-CM | POA: Diagnosis not present

## 2016-01-19 DIAGNOSIS — F0150 Vascular dementia without behavioral disturbance: Secondary | ICD-10-CM | POA: Diagnosis present

## 2016-01-19 DIAGNOSIS — I214 Non-ST elevation (NSTEMI) myocardial infarction: Secondary | ICD-10-CM

## 2016-01-19 DIAGNOSIS — R079 Chest pain, unspecified: Secondary | ICD-10-CM

## 2016-01-19 DIAGNOSIS — R451 Restlessness and agitation: Secondary | ICD-10-CM | POA: Diagnosis present

## 2016-01-19 DIAGNOSIS — N400 Enlarged prostate without lower urinary tract symptoms: Secondary | ICD-10-CM | POA: Diagnosis present

## 2016-01-19 DIAGNOSIS — I6521 Occlusion and stenosis of right carotid artery: Secondary | ICD-10-CM | POA: Diagnosis present

## 2016-01-19 DIAGNOSIS — M109 Gout, unspecified: Secondary | ICD-10-CM | POA: Diagnosis present

## 2016-01-19 DIAGNOSIS — D638 Anemia in other chronic diseases classified elsewhere: Secondary | ICD-10-CM | POA: Diagnosis present

## 2016-01-19 DIAGNOSIS — J810 Acute pulmonary edema: Secondary | ICD-10-CM | POA: Diagnosis not present

## 2016-01-19 DIAGNOSIS — I130 Hypertensive heart and chronic kidney disease with heart failure and stage 1 through stage 4 chronic kidney disease, or unspecified chronic kidney disease: Secondary | ICD-10-CM | POA: Diagnosis present

## 2016-01-19 DIAGNOSIS — I429 Cardiomyopathy, unspecified: Secondary | ICD-10-CM | POA: Diagnosis present

## 2016-01-19 DIAGNOSIS — I739 Peripheral vascular disease, unspecified: Secondary | ICD-10-CM | POA: Diagnosis present

## 2016-01-19 DIAGNOSIS — Z66 Do not resuscitate: Secondary | ICD-10-CM | POA: Diagnosis not present

## 2016-01-19 DIAGNOSIS — I25110 Atherosclerotic heart disease of native coronary artery with unstable angina pectoris: Secondary | ICD-10-CM | POA: Diagnosis present

## 2016-01-19 DIAGNOSIS — I5043 Acute on chronic combined systolic (congestive) and diastolic (congestive) heart failure: Secondary | ICD-10-CM | POA: Diagnosis present

## 2016-01-19 HISTORY — DX: Non-ST elevation (NSTEMI) myocardial infarction: I21.4

## 2016-01-19 LAB — BASIC METABOLIC PANEL
ANION GAP: 9 (ref 5–15)
Anion gap: 14 (ref 5–15)
BUN: 65 mg/dL — AB (ref 6–20)
BUN: 65 mg/dL — ABNORMAL HIGH (ref 6–20)
CO2: 19 mmol/L — AB (ref 22–32)
CO2: 22 mmol/L (ref 22–32)
Calcium: 8.8 mg/dL — ABNORMAL LOW (ref 8.9–10.3)
Calcium: 8.2 mg/dL — ABNORMAL LOW (ref 8.9–10.3)
Chloride: 103 mmol/L (ref 101–111)
Chloride: 106 mmol/L (ref 101–111)
Creatinine, Ser: 2.18 mg/dL — ABNORMAL HIGH (ref 0.61–1.24)
Creatinine, Ser: 2.26 mg/dL — ABNORMAL HIGH (ref 0.61–1.24)
GFR calc Af Amer: 29 mL/min — ABNORMAL LOW (ref >60)
GFR calc non Af Amer: 24 mL/min — ABNORMAL LOW (ref >60)
GFR, EST AFRICAN AMERICAN: 28 mL/min — AB (ref >60)
GFR, EST NON AFRICAN AMERICAN: 25 mL/min — AB (ref >60)
GLUCOSE: 111 mg/dL — AB (ref 65–99)
GLUCOSE: 95 mg/dL (ref 65–99)
POTASSIUM: 4.8 mmol/L (ref 3.5–5.1)
POTASSIUM: 4.5 mmol/L (ref 3.5–5.1)
Sodium: 136 mmol/L (ref 135–145)
Sodium: 137 mmol/L (ref 135–145)

## 2016-01-19 LAB — TROPONIN I
Troponin I: 1.25 ng/mL (ref <0.03)
Troponin I: 1.01 ng/mL (ref <0.03)

## 2016-01-19 LAB — CBC
HEMATOCRIT: 30.6 % — AB (ref 39.0–52.0)
Hemoglobin: 9.9 g/dL — ABNORMAL LOW (ref 13.0–17.0)
MCH: 30.3 pg (ref 26.0–34.0)
MCHC: 32.4 g/dL (ref 30.0–36.0)
MCV: 93.6 fL (ref 78.0–100.0)
PLATELETS: 194 10*3/uL (ref 150–400)
RBC: 3.27 MIL/uL — AB (ref 4.22–5.81)
RDW: 16.6 % — AB (ref 11.5–15.5)
WBC: 7.5 10*3/uL (ref 4.0–10.5)

## 2016-01-19 LAB — MRSA PCR SCREENING: MRSA by PCR: NEGATIVE

## 2016-01-19 LAB — ECHOCARDIOGRAM COMPLETE
Height: 65 in
WEIGHTICAEL: 2116.42 [oz_av]

## 2016-01-19 LAB — I-STAT CG4 LACTIC ACID, ED: LACTIC ACID, VENOUS: 2.02 mmol/L — AB (ref 0.5–1.9)

## 2016-01-19 LAB — PROTIME-INR
INR: 1.43
Prothrombin Time: 17.6 seconds — ABNORMAL HIGH (ref 11.4–15.2)

## 2016-01-19 LAB — HEPARIN LEVEL (UNFRACTIONATED)
HEPARIN UNFRACTIONATED: 0.2 [IU]/mL — AB (ref 0.30–0.70)
Heparin Unfractionated: 0.18 IU/mL — ABNORMAL LOW (ref 0.30–0.70)

## 2016-01-19 MED ORDER — PRAVASTATIN SODIUM 40 MG PO TABS
40.0000 mg | ORAL_TABLET | Freq: Every day | ORAL | Status: DC
  Administered 2016-01-19 – 2016-01-21 (×3): 40 mg via ORAL
  Filled 2016-01-19 (×3): qty 1

## 2016-01-19 MED ORDER — MORPHINE SULFATE (PF) 4 MG/ML IV SOLN
4.0000 mg | INTRAVENOUS | Status: DC | PRN
  Administered 2016-01-20 (×2): 2 mg via INTRAVENOUS
  Filled 2016-01-19 (×2): qty 1

## 2016-01-19 MED ORDER — METOPROLOL TARTRATE 12.5 MG HALF TABLET
12.5000 mg | ORAL_TABLET | Freq: Two times a day (BID) | ORAL | Status: DC
  Administered 2016-01-20: 12.5 mg via ORAL
  Filled 2016-01-19: qty 1

## 2016-01-19 MED ORDER — SODIUM CHLORIDE 0.9 % IV SOLN
INTRAVENOUS | Status: DC
  Administered 2016-01-19: 07:00:00 via INTRAVENOUS

## 2016-01-19 MED ORDER — CLOPIDOGREL BISULFATE 75 MG PO TABS
75.0000 mg | ORAL_TABLET | Freq: Every day | ORAL | Status: DC
Start: 2016-01-19 — End: 2016-01-20
  Administered 2016-01-19: 75 mg via ORAL
  Filled 2016-01-19: qty 1

## 2016-01-19 MED ORDER — PANTOPRAZOLE SODIUM 40 MG PO TBEC
40.0000 mg | DELAYED_RELEASE_TABLET | Freq: Every day | ORAL | Status: DC
  Administered 2016-01-19: 40 mg via ORAL
  Filled 2016-01-19: qty 1

## 2016-01-19 MED ORDER — METOPROLOL TARTRATE 25 MG PO TABS
12.5000 mg | ORAL_TABLET | Freq: Once | ORAL | Status: AC
  Administered 2016-01-19: 12.5 mg via ORAL
  Filled 2016-01-19: qty 1

## 2016-01-19 MED ORDER — MORPHINE SULFATE (PF) 4 MG/ML IV SOLN
4.0000 mg | Freq: Once | INTRAVENOUS | Status: AC
  Administered 2016-01-19: 4 mg via INTRAVENOUS
  Filled 2016-01-19: qty 1

## 2016-01-19 NOTE — ED Notes (Signed)
Pt reports worsening sob and chest pain, this RN will page MD to make him aware.

## 2016-01-19 NOTE — ED Notes (Signed)
Dr croitoru at bedside

## 2016-01-19 NOTE — ED Notes (Signed)
Patient's O2 turned to 4L [place] due to O2 sat of 89%. pts O2 sat up to 92%.

## 2016-01-19 NOTE — Progress Notes (Signed)
ANTICOAGULATION CONSULT NOTE  Pharmacy Consult for Heparin Indication: chest pain/ACS  Allergies  Allergen Reactions  . Atenolol   . Diltiazem   . Hydrochlorothiazide   . Labetalol   . Lovastatin   . Niacin   . Simvastatin   . Terazosin   . Iron     Just had constipation    Patient Measurements: Height: 5\' 6"  (167.6 cm) Weight: 142 lb (64.4 kg) IBW/kg (Calculated) : 63.8  Vital Signs: Temp: 98.2 F (36.8 C) (12/05 1756) Temp Source: Oral (12/05 1756) BP: 109/66 (12/05 1756) Pulse Rate: 83 (12/05 1756)  Labs:  Recent Labs  03-03-2015 2224 03-03-2015 2233 01/19/16 0419 01/19/16 0754 01/19/16 1125 01/19/16 1816  HGB 10.2*  --  9.9*  --   --   --   HCT 31.6*  --  30.6*  --   --   --   PLT 223  --  194  --   --   --   LABPROT  --   --  17.6*  --   --   --   INR  --   --  1.43  --   --   --   HEPARINUNFRC  --   --   --  0.20*  --  0.18*  CREATININE 2.24*  --  2.18*  --  2.26*  --   TROPONINI  --  1.25* 1.01*  --   --   --     Estimated Creatinine Clearance: 20 mL/min (by C-G formula based on SCr of 2.26 mg/dL (H)).   Medical History: Past Medical History:  Diagnosis Date  . Anemia of chronic disease 04/2009   hgb 8...transfusion 04/2009  . BPH (benign prostatic hyperplasia)   . Carotid artery disease (HCC)    Total right carotid, 60-79% LICA Dr. Edilia Bodickson, July, 2011 / left carotid endarterectomy January, 2012     . Chest pain    Nuclear, April, 2011, no scar or ischemia  . CHF (congestive heart failure) (HCC) 04/07/09   CHF while in hospital February, 2011, EF 45%  . CKD (chronic kidney disease), stage II   . Community acquired pneumonia   . Ejection fraction    45% by history  . Esophageal stricture    Dilatation and VA, / dilatation later by Dr. Arlyce DiceKaplan  . GERD (gastroesophageal reflux disease)   . Gout   . Hyperlipidemia   . Hypertension   . Impaired fasting glucose   . NSTEMI (non-ST elevated myocardial infarction) (HCC) 01/19/2016    Medications:   See electronic med rec  Assessment: 82 y.o. M presents with SOB, CP. To begin heparin for r/o ACS. Trop up to 1.28. No AC PTA. Hgb low but stable on admission.  HL is still subtherapeutic at 0.18 on heparin 900 units/hr. No issues with infusion or bleeding noted.  Goal of Therapy:  Heparin level 0.3-0.7 units/ml Monitor platelets by anticoagulation protocol: Yes   Plan:  Increase heparin to 1150 units/hr 8h HL Daily heparin level and CBC  Sheppard CoilFrank Wilson PharmD., BCPS Clinical Pharmacist Pager [phone] 01/19/2016 7:13 PM

## 2016-01-19 NOTE — H&P (Addendum)
RFA: NSTEMI  HPI: [age]yoM with hx of HTN, CKD, CHF, (normal LVEF in 2011 on nuclear), who presents with 2 months of typical angina (chest tightness with exertion, relieved by rest), that progressed to severe rest chest discomfort tonight.  He states he was an 8/10 in discomfort tonight, and this severity lasted for 30 minutes until EMS gave him fentanyl.  He has received 325 ASA en route, and is currently a 1/10 on 5 of NTG gtt.  Troponin has ruled in at 1.28.  14 point ROS as otherwise notable for mildly productive cough.  Past Medical History:  Diagnosis Date  . Anemia of chronic disease 04/2009   hgb 8...transfusion 04/2009  . BPH (benign prostatic hyperplasia)   . Carotid artery disease (HCC)    Total right carotid, 60-79% LICA Dr. Edilia Bodickson, July, 2011 / left carotid endarterectomy January, 2012     . Chest pain    Nuclear, April, 2011, no scar or ischemia  . CHF (congestive heart failure) (HCC) 04/07/09   CHF while in hospital February, 2011, EF 45%  . CKD (chronic kidney disease), stage II   . Community acquired pneumonia   . Ejection fraction    45% by history  . Esophageal stricture    Dilatation and VA, / dilatation later by Dr. Arlyce DiceKaplan  . GERD (gastroesophageal reflux disease)   . Gout   . Hyperlipidemia   . Hypertension   . Impaired fasting glucose    Med Current Facility-Administered Medications on File Prior to Encounter  Medication Dose Route Frequency Provider Last Rate Last Dose  . ferumoxytol [facility](FERAHEME) injection 510 mg  510 mg Intravenous Once Primitivo GauzeMichael Mattingly, MD       Current Outpatient Prescriptions on File Prior to Encounter  Medication Sig Dispense Refill  . amLODipine (NORVASC) 10 MG tablet Take 10 mg by mouth daily.    Marland Kitchen. aspirin 81 MG tablet Take 81 mg by mouth daily.      Marland Kitchen. b complex vitamins capsule Take 1 capsule by mouth daily. Plus  C    . carvedilol (COREG) 12.5 MG tablet TAKE 1 TABLET BY MOUTH TWICE A DAY WITH A MEAL 180 tablet 2  .  cholecalciferol (VITAMIN D) 1000 UNITS tablet Take 1,000 Units by mouth daily.    . clopidogrel (PLAVIX) 75 MG tablet TAKE 1 TABLET (75 MG TOTAL) BY MOUTH DAILY. 90 tablet 3  . furosemide (LASIX) 40 MG tablet TAKE 1 TABLET (40 MG TOTAL) BY MOUTH DAILY, MAY TAKE 2ND DOSE AT LUNCHTIME IF FEET ARE SWOLLEN 180 tablet 1  . isosorbide mononitrate (IMDUR) 30 MG 24 hr tablet TAKE 1 TABLET BY MOUTH EVERY DAY 90 tablet 3  . lisinopril (PRINIVIL,ZESTRIL) 20 MG tablet TAKE 1 TABLET BY MOUTH EVERY DAY 90 tablet 3  . pantoprazole (PROTONIX) 40 MG tablet Take 1 tablet (40 mg total) by mouth daily. 90 tablet 3  . pravastatin (PRAVACHOL) 40 MG tablet TAKE 1 TABLET (40 MG TOTAL) BY MOUTH DAILY. 90 tablet 2  . prednisoLONE acetate (PRED FORTE) 1 % ophthalmic suspension Place 1 drop into the right eye daily.      Allergy Allergies  Allergen Reactions  . Atenolol   . Diltiazem   . Hydrochlorothiazide   . Labetalol   . Lovastatin   . Niacin   . Simvastatin   . Terazosin   . Iron     Just had constipation   Soc Hx: Social History   Social History  . Marital status: Married  Spouse name: N/A  . Number of children: 3  . Years of education: N/A   Occupational History  . 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 Smoking status: Former Smoker    Types: Cigarettes    Quit date: 02/15/1967  . Smokeless tobacco: Never Used  . Alcohol use No  . Drug use: No  . Sexual activity: Not on file   Other Topics Concern  . Not on file   Social History Narrative   Has living will    Wife, then oldest daughter to make health care decisions Luster Landsberg(Renee)    Will accept resuscitation but no prolonged mechanical ventilation   No tube feeds if cognitively unaware   Fam Hx: Family History  Problem Relation Age of Onset  . Cancer Father     Prostate  . Hypertension Father   . Heart disease Father     Heart Disease before age 10860  . Heart disease Brother     x2  . Hypertension Brother   . Heart  attack Brother   . Hypertension Mother   . Hypertension Sister   . Peripheral vascular disease Sister   . Hypertension Daughter    Exam BP 100/58   Pulse 71   Temp 98.3 F (36.8 C) (Axillary)   Resp 18   Ht 5\' 5"  (1.651 m)   Wt 60 kg (132 lb 4.4 oz)   SpO2 96%   BMI 22.01 kg/m  89yoM in nad JVP is normal Bilateral rales at bases rrr s1 s2 no mrg Soft nt nd No edema, warm well perfused extremities AAO without gross focal deficit  Labs: 7.4 >----------< 223               31.6  136   102   61 ----------------------< 172 4.6    22     2.24  BNP 1,530  Trop 1.28  ============================ ECG elevation in AVR, with lateral ST depressions, sinus  Nuclear 11/25/10 - did not meet target heart rate, positive ECG with chest pain, but no-ischemic changes noted on myoview - normal LVEF  CXR: bilateral pleural effusions, concern for PNA  A/P 89yoM with HTN and CKD (cr 2.24), presents with NSTEMI.  timi score of at least 4.  He is currently comfortable with minimal chest pain.  Plan 1.  Heparin 4,000 units now and then heparin gtt 2.  Metoprolol 12.5 po  3.  Titrate NTG gtt to lowest amount of chest pain.  Please keep SBP > 90. 4.  ASA 325 mg, takes plavix at home, will continue 5.  Ideally would change statin to atorva, but multiple intolerances in past 6.  NPO for likely LHC in am.  Hold ace-I given cr of 2.24.  Hold lasix. 7.  Trend troponin 8.  If CP worsens will need emergent LHC 9.  Clinical picture is inconsistent with pna.  No abx at this time 10.  ECho tomorrow  FEN - no IVF, K>4, MG > 2, NPO for LHC in am  Dossie ArbourEdward Oreta Soloway, MD Cardiology   =============================  ADDENDUM  Chest pain recurred 6/10 at 4:45.  After 4 IV morphine and aggressive titration of his nitro gtt to 150 mcg/minute, he is at a 2/10.  ECG is actually somewhat improved with less ST depression.    SBP remains unchanged with higher NTG dose.

## 2016-01-19 NOTE — ED Notes (Signed)
Family at bedside. 

## 2016-01-19 NOTE — ED Notes (Signed)
Pts daughter Luster LandsbergRenee left her number [phone] and would like to be contacted if patient goes upstairs before she returns

## 2016-01-19 NOTE — Progress Notes (Addendum)
ANTICOAGULATION CONSULT NOTE  Pharmacy Consult for Heparin Indication: chest pain/ACS  Allergies  Allergen Reactions  . Atenolol   . Diltiazem   . Hydrochlorothiazide   . Labetalol   . Lovastatin   . Niacin   . Simvastatin   . Terazosin   . Iron     Just had constipation    Patient Measurements: Height: 5\' 5"  (165.1 cm) Weight: 132 lb 4.4 oz (60 kg) IBW/kg (Calculated) : 61.5  Vital Signs: Temp: 98.3 F (36.8 C) (12/04 2221) Temp Source: Axillary (12/04 2221) BP: 95/55 (12/05 0800) Pulse Rate: 64 (12/05 0800)  Labs:  Recent Labs  02/05/2016 2224 02/05/2016 2233 01/19/16 0419 01/19/16 0754  HGB 10.2*  --  9.9*  --   HCT 31.6*  --  30.6*  --   PLT 223  --  194  --   LABPROT  --   --  17.6*  --   INR  --   --  1.43  --   HEPARINUNFRC  --   --   --  0.20*  CREATININE 2.24*  --  2.18*  --   TROPONINI  --  1.25* 1.01*  --     Estimated Creatinine Clearance: 19.5 mL/min (by C-G formula based on SCr of 2.18 mg/dL (H)).   Medical History: Past Medical History:  Diagnosis Date  . Anemia of chronic disease 04/2009   hgb 8...transfusion 04/2009  . BPH (benign prostatic hyperplasia)   . Carotid artery disease (HCC)    Total right carotid, 60-79% LICA Dr. Edilia Bodickson, July, 2011 / left carotid endarterectomy January, 2012     . Chest pain    Nuclear, April, 2011, no scar or ischemia  . CHF (congestive heart failure) (HCC) 04/07/09   CHF while in hospital February, 2011, EF 45%  . CKD (chronic kidney disease), stage II   . Community acquired pneumonia   . Ejection fraction    45% by history  . Esophageal stricture    Dilatation and VA, / dilatation later by Dr. Arlyce DiceKaplan  . GERD (gastroesophageal reflux disease)   . Gout   . Hyperlipidemia   . Hypertension   . Impaired fasting glucose   . NSTEMI (non-ST elevated myocardial infarction) (HCC) 01/19/2016    Medications:  See electronic med rec  Assessment: 82 y.o. M presents with SOB, CP. To begin heparin for r/o ACS.  Trop up to 1.28. No AC PTA. Hgb low but stable on admission.  Initial HL is subtherapeutic at 0.2 on heparin 750 units/hr. No issues with infusion or bleeding noted.  Goal of Therapy:  Heparin level 0.3-0.7 units/ml Monitor platelets by anticoagulation protocol: Yes   Plan:  Increase heparin to 900 units/hr 8h HL Daily heparin level and CBC  Arlean Hoppingorey M. Newman PiesBall, PharmD, BCPS Clinical Pharmacist 01/19/2016,8:58 AM

## 2016-01-19 NOTE — ED Notes (Signed)
pts daughter renee made aware that patient has a room assignment, per patient's request.

## 2016-01-19 NOTE — ED Notes (Signed)
Echo being done at bedside.

## 2016-01-19 NOTE — Progress Notes (Signed)
Patient admitted from ED with left sided CP 10/10 after staff sliding from stretcher.  Oxygen saturations 87% 4l [place].  Oxygen increased to 6l [place] and saturations up to 91-92%.  Randall AnBrittany Strader PA paged and notified.  NTG titrated up with chest pain down to 4/10.  Primary Nurse Despina AriasJalesa aware and in to see patient.  Colman Caterarpley, Gorden Stthomas Danielle

## 2016-01-19 NOTE — ED Notes (Signed)
Pt started c/o CP again suddenly, pain 6 out 10, increased nitro, spoke with cards, new orders to increase nitro to 50mcg and give 4mg  of morphine

## 2016-01-19 NOTE — ED Notes (Signed)
Dr Royann Shiversroitoru made aware of patient's worsening sob and return of his chest pain, MD advised this RN to repeat EKG. MD stated that he would be down soon to see the patient.

## 2016-01-19 NOTE — ED Notes (Signed)
Dr. Benay PillowSze contacted this RN stating that due to patient's creatnine, patient's catherization will be delayed until tomorrow. MD advised that patient may eat and will remain on nitro and heparin at this time.

## 2016-01-19 NOTE — Progress Notes (Signed)
Echocardiogram 2D Echocardiogram has been performed.  Anthony Werner 01/19/2016, 4:57 PM

## 2016-01-19 NOTE — Progress Notes (Signed)
Creatinine slightly worse. Angina free. Will defer procedure until tomorrow. Thurmon FairMihai Romulus Hanrahan, MD, [facility] [phone] office [phone] pager

## 2016-01-19 NOTE — ED Notes (Signed)
This RN advised in report from Enis GashJessica F., RN that patient is on 100mcg of nitro per Cardiologists request.

## 2016-01-19 NOTE — Progress Notes (Signed)
Patient Name: Jac CanavanHarold J Kissoon Date of Encounter: 01/19/2016  Primary Cardiologist: Elease HashimotoNahser ([facility]  Hospital Problem List     Active Problems:   NSTEMI (non-ST elevated myocardial infarction) [facility](HCC)     Subjective   Now angina free on IV NTG.  Comfortable.  Inpatient Medications    Scheduled Meds: . clopidogrel  75 mg Oral Daily  . [START ON 01/18/2016] metoprolol tartrate  12.5 mg Oral BID  . pantoprazole  40 mg Oral Daily  . pravastatin  40 mg Oral q1800   Continuous Infusions: . sodium chloride 150 mL/hr at 01/19/16 0631  . heparin 900 Units/hr (01/19/16 0910)  . nitroGLYCERIN 100 mcg/min (01/19/16 16100632)   PRN Meds: morphine injection   Vital Signs    Vitals:   01/19/16 0815 01/19/16 0830 01/19/16 0845 01/19/16 0900  BP: 93/55 (!) 94/53 (!) 92/53 99/60  Pulse: 65 62 65 66  Resp: 18 14 17 16   Temp:      TempSrc:      SpO2: 95% 94% 96% 94%  Weight:      Height:        Intake/Output Summary (Last 24 hours) at 01/19/16 0926 Last data filed at 01/19/16 0042  Gross per 24 hour  Intake              500 ml  Output                0 ml  Net              500 ml   Filed Weights   01/25/2016 2330  Weight: 132 lb 4.4 oz (60 kg)    Physical Exam    GEN: Well nourished, well developed, in no acute distress.  HEENT: Grossly normal.  Neck: Supple, no JVD, carotid bruits, or masses. Cardiac: RRR, no murmurs, rubs, or gallops. No clubbing, cyanosis, edema.  Radials/DP/PT 2+ and equal bilaterally.  Respiratory:  Respirations regular and unlabored, clear to auscultation bilaterally. GI: Soft, nontender, nondistended, BS + x 4. MS: no deformity or atrophy. Skin: warm and dry, no rash. Neuro:  Strength and sensation are intact. Psych: AAOx3.  Normal affect.  Labs    CBC  Recent Labs  02/10/2016 2224 01/19/16 0419  WBC 7.4 7.5  HGB 10.2* 9.9*  HCT 31.6* 30.6*  MCV 93.8 93.6  PLT 223 194   Basic Metabolic Panel  Recent Labs  01/30/2016 2224  01/19/16 0419  NA 136 136  K 4.6 4.8  CL 102 103  CO2 22 19*  GLUCOSE 172* 111*  BUN 61* 65*  CREATININE 2.24* 2.18*  CALCIUM 9.2 8.8*   Liver Function Tests No results for input(s): AST, ALT, ALKPHOS, BILITOT, PROT, ALBUMIN in the last 72 hours. No results for input(s): LIPASE, AMYLASE in the last 72 hours. Cardiac Enzymes  Recent Labs  01/25/2016 2233 01/19/16 0419  TROPONINI 1.25* 1.01*    Telemetry    NSR - Personally Reviewed  ECG    NSR, lateral ST depression - Personally Reviewed  Radiology    Dg Chest 2 View  Result Date: 01/31/2016 CLINICAL DATA:  Shortness of breath, productive cough for several days. History of CHF. EXAM: CHEST  2 VIEW COMPARISON:  Chest radiograph April 08, 2011 FINDINGS: Cardiac silhouette is mildly enlarged, calcified aorta. Diffuse interstitial prominence, confluent in the lung bases. Small pleural effusions. Trachea projects midline and there is no pneumothorax. Soft tissue planes and included osseous structures are nonsuspicious. Osteopenia. IMPRESSION: Interstitial prominence confluent  in lung bases concerning for bronchopneumonia. Small pleural effusions. Followup PA and lateral chest X-ray is recommended in 3-4 weeks following trial of antibiotic therapy to ensure resolution and exclude underlying malignancy. Mild cardiomegaly. Electronically Signed   By: Awilda Metroourtnay  Bloomer M.D.   On: August 03, 2015 23:00    Cardiac Studies   2001 Echo - Left ventricle: There is decreased thickening of both the inferior wall and the posterior wall. The EF is in the 45% range. The cavity size was normal. Wall thickness was increased in a pattern of mild LVH. The estimated ejection fraction was 45%. - Aortic valve: Sclerosis without stenosis. - Mitral valve: Calcified annulus. - Tricuspid valve: Mild regurgitation. - Pulmonary arteries: PA peak pressure: 44mm Hg (S).  Patient Profile     82 yo with preexisting mild cardiomyopathy, carotid stenosis  (occluded RICA, s/p CEA LICA 2012), CKD 3, HTN presents with about 2 months of effort angina, now progressing to angina at rest an NSTEMI. Creat 2.18 is slightly above baseline (1.6-1.8).  Assessment & Plan    1. NSTEMI: high risk features. Needs coronary angio, but since symptoms have now abated will allow several hours for IV fluids and optimization of renal function. Recheck creat at 1100h. This procedure has been fully reviewed with the patient and written informed consent has been obtained. 2. CKD 3: reviewed significant risk of CIN, possible need for deferred/staged PCI depnding on cath findings. 3. CHF: mildly depressed EF, no clinical HF since episode of anemia 2011. Appears clinically euvolemic right now. Note that current BNP is comparable to elevated BNP when he had clinical HF in 2011. 4. HTN: controlled. Hold diuretics and RAAS inhibitors until after cath 5. Occluded R carotid, s/p L CEA 6. HLP: excellent lipids 04/2069, LDL 67  Signed, Parissa Chiao, MD  01/19/2016, 9:26 AM

## 2016-01-20 ENCOUNTER — Inpatient Hospital Stay (HOSPITAL_COMMUNITY): Payer: Medicare Other

## 2016-01-20 ENCOUNTER — Encounter (HOSPITAL_COMMUNITY): Admission: EM | Disposition: E | Payer: Self-pay | Source: Home / Self Care | Attending: Internal Medicine

## 2016-01-20 DIAGNOSIS — E785 Hyperlipidemia, unspecified: Secondary | ICD-10-CM

## 2016-01-20 DIAGNOSIS — I2510 Atherosclerotic heart disease of native coronary artery without angina pectoris: Secondary | ICD-10-CM

## 2016-01-20 DIAGNOSIS — L8990 Pressure ulcer of unspecified site, unspecified stage: Secondary | ICD-10-CM | POA: Insufficient documentation

## 2016-01-20 DIAGNOSIS — N183 Chronic kidney disease, stage 3 (moderate): Secondary | ICD-10-CM

## 2016-01-20 DIAGNOSIS — J9601 Acute respiratory failure with hypoxia: Secondary | ICD-10-CM

## 2016-01-20 DIAGNOSIS — J810 Acute pulmonary edema: Secondary | ICD-10-CM

## 2016-01-20 DIAGNOSIS — I5023 Acute on chronic systolic (congestive) heart failure: Secondary | ICD-10-CM

## 2016-01-20 HISTORY — PX: CARDIAC CATHETERIZATION: SHX172

## 2016-01-20 LAB — BASIC METABOLIC PANEL
Anion gap: 13 (ref 5–15)
BUN: 67 mg/dL — AB (ref 6–20)
CALCIUM: 8.5 mg/dL — AB (ref 8.9–10.3)
CHLORIDE: 103 mmol/L (ref 101–111)
CO2: 20 mmol/L — ABNORMAL LOW (ref 22–32)
CREATININE: 2.31 mg/dL — AB (ref 0.61–1.24)
GFR, EST AFRICAN AMERICAN: 27 mL/min — AB (ref >60)
GFR, EST NON AFRICAN AMERICAN: 23 mL/min — AB (ref >60)
Glucose, Bld: 140 mg/dL — ABNORMAL HIGH (ref 65–99)
Potassium: 4.2 mmol/L (ref 3.5–5.1)
SODIUM: 136 mmol/L (ref 135–145)

## 2016-01-20 LAB — BLOOD GAS, ARTERIAL
ACID-BASE DEFICIT: 6.1 mmol/L — AB (ref 0.0–2.0)
Acid-base deficit: 5.1 mmol/L — ABNORMAL HIGH (ref 0.0–2.0)
BICARBONATE: 18.3 mmol/L — AB (ref 20.0–28.0)
Bicarbonate: 18.3 mmol/L — ABNORMAL LOW (ref 20.0–28.0)
DRAWN BY: 225631
DRAWN BY: 441371
FIO2: 100
MECHVT: 530 mL
O2 CONTENT: 6 L/min
O2 SAT: 83.6 %
O2 Saturation: 96.8 %
PATIENT TEMPERATURE: 98.6
PEEP/CPAP: 5 cmH2O
PO2 ART: 51.2 mmHg — AB (ref 83.0–108.0)
Patient temperature: 98.4
RATE: 12 resp/min
pCO2 arterial: 27.3 mmHg — ABNORMAL LOW (ref 32.0–48.0)
pCO2 arterial: 32.7 mmHg (ref 32.0–48.0)
pH, Arterial: 7.441 (ref 7.350–7.450)
pH, Arterial: 7.366 (ref 7.350–7.450)
pO2, Arterial: 102 mmHg (ref 83.0–108.0)

## 2016-01-20 LAB — POCT I-STAT 3, VENOUS BLOOD GAS (G3P V)
ACID-BASE DEFICIT: 5 mmol/L — AB (ref 0.0–2.0)
Bicarbonate: 21.2 mmol/L (ref 20.0–28.0)
O2 Saturation: 47 %
PH VEN: 7.309 (ref 7.250–7.430)
TCO2: 22 mmol/L (ref 0–100)
pCO2, Ven: 42.1 mmHg — ABNORMAL LOW (ref 44.0–60.0)
pO2, Ven: 28 mmHg — CL (ref 32.0–45.0)

## 2016-01-20 LAB — POCT I-STAT 3, ART BLOOD GAS (G3+)
ACID-BASE DEFICIT: 5 mmol/L — AB (ref 0.0–2.0)
Bicarbonate: 19.6 mmol/L — ABNORMAL LOW (ref 20.0–28.0)
O2 SAT: 96 %
PCO2 ART: 34.1 mmHg (ref 32.0–48.0)
TCO2: 21 mmol/L (ref 0–100)
pH, Arterial: 7.368 (ref 7.350–7.450)
pO2, Arterial: 84 mmHg (ref 83.0–108.0)

## 2016-01-20 LAB — POCT ACTIVATED CLOTTING TIME: Activated Clotting Time: 384 seconds

## 2016-01-20 LAB — GLUCOSE, CAPILLARY
GLUCOSE-CAPILLARY: 106 mg/dL — AB (ref 65–99)
GLUCOSE-CAPILLARY: 107 mg/dL — AB (ref 65–99)
GLUCOSE-CAPILLARY: 73 mg/dL (ref 65–99)

## 2016-01-20 LAB — SURGICAL PCR SCREEN
MRSA, PCR: NEGATIVE
Staphylococcus aureus: POSITIVE — AB

## 2016-01-20 LAB — CBC
HCT: 30 % — ABNORMAL LOW (ref 39.0–52.0)
HCT: 30.5 % — ABNORMAL LOW (ref 39.0–52.0)
HEMOGLOBIN: 9.8 g/dL — AB (ref 13.0–17.0)
Hemoglobin: 9.9 g/dL — ABNORMAL LOW (ref 13.0–17.0)
MCH: 30.2 pg (ref 26.0–34.0)
MCH: 30.5 pg (ref 26.0–34.0)
MCHC: 32.7 g/dL (ref 30.0–36.0)
MCHC: 32.5 g/dL (ref 30.0–36.0)
MCV: 92.6 fL (ref 78.0–100.0)
MCV: 93.8 fL (ref 78.0–100.0)
PLATELETS: 214 10*3/uL (ref 150–400)
PLATELETS: 222 10*3/uL (ref 150–400)
RBC: 3.24 MIL/uL — ABNORMAL LOW (ref 4.22–5.81)
RBC: 3.25 MIL/uL — AB (ref 4.22–5.81)
RDW: 16.7 % — AB (ref 11.5–15.5)
RDW: 16.8 % — AB (ref 11.5–15.5)
WBC: 8.9 10*3/uL (ref 4.0–10.5)
WBC: 12 10*3/uL — ABNORMAL HIGH (ref 4.0–10.5)

## 2016-01-20 LAB — D-DIMER, QUANTITATIVE: D-Dimer, Quant: 0.57 ug/mL-FEU — ABNORMAL HIGH (ref 0.00–0.50)

## 2016-01-20 LAB — HEPARIN LEVEL (UNFRACTIONATED): HEPARIN UNFRACTIONATED: 0.31 [IU]/mL (ref 0.30–0.70)

## 2016-01-20 LAB — TROPONIN I: TROPONIN I: 1.47 ng/mL — AB (ref <0.03)

## 2016-01-20 SURGERY — LEFT HEART CATH AND CORONARY ANGIOGRAPHY
Laterality: Right

## 2016-01-20 MED ORDER — MIDAZOLAM HCL 2 MG/2ML IJ SOLN
INTRAMUSCULAR | Status: DC | PRN
  Administered 2016-01-20 (×3): 1 mg via INTRAVENOUS

## 2016-01-20 MED ORDER — HEPARIN SODIUM (PORCINE) 5000 UNIT/ML IJ SOLN: 5000.0000 [IU] | Freq: Three times a day (TID) | INTRAMUSCULAR | Status: DC

## 2016-01-20 MED ORDER — MIDAZOLAM HCL 2 MG/2ML IJ SOLN
1.0000 mg | INTRAMUSCULAR | Status: DC | PRN
  Administered 2016-01-20: 1 mg via INTRAVENOUS

## 2016-01-20 MED ORDER — HEPARIN (PORCINE) IN NACL 2-0.9 UNIT/ML-% IJ SOLN
INTRAMUSCULAR | Status: DC | PRN
  Administered 2016-01-20: 1000 mL

## 2016-01-20 MED ORDER — SODIUM CHLORIDE 0.9 % WEIGHT BASED INFUSION
1.0000 mL/kg/h | INTRAVENOUS | Status: AC
  Administered 2016-01-20: 1 mL/kg/h via INTRAVENOUS

## 2016-01-20 MED ORDER — SODIUM CHLORIDE 0.9 % IV SOLN
INTRAVENOUS | Status: DC
  Administered 2016-01-20: 10:00:00 via INTRAVENOUS

## 2016-01-20 MED ORDER — SODIUM CHLORIDE 0.9 % IV SOLN: 250.0000 mL | INTRAVENOUS | Status: DC | PRN

## 2016-01-20 MED ORDER — ETOMIDATE 2 MG/ML IV SOLN
0.3000 mg/kg | Freq: Once | INTRAVENOUS | Status: AC
Start: 2016-01-20 — End: 2016-01-20
  Administered 2016-01-20: 10 mg via INTRAVENOUS

## 2016-01-20 MED ORDER — ASPIRIN 81 MG PO CHEW
81.0000 mg | CHEWABLE_TABLET | ORAL | Status: AC
  Administered 2016-01-20: 81 mg via ORAL

## 2016-01-20 MED ORDER — NITROGLYCERIN 1 MG/10 ML FOR IR/CATH LAB
INTRA_ARTERIAL | Status: AC
  Filled 2016-01-20: qty 10

## 2016-01-20 MED ORDER — CLOPIDOGREL BISULFATE 75 MG PO TABS
600.0000 mg | ORAL_TABLET | Freq: Once | ORAL | Status: AC
Start: 2016-01-20 — End: 2016-01-20
  Administered 2016-01-20: 600 mg via NASOGASTRIC
  Filled 2016-01-20: qty 8

## 2016-01-20 MED ORDER — ASPIRIN 81 MG PO CHEW
81.0000 mg | CHEWABLE_TABLET | Freq: Every day | ORAL | Status: DC
Start: 2016-01-21 — End: 2016-01-22
  Administered 2016-01-21: 81 mg via ORAL
  Filled 2016-01-20: qty 1

## 2016-01-20 MED ORDER — MUPIROCIN 2 % EX OINT
1.0000 "application " | TOPICAL_OINTMENT | Freq: Two times a day (BID) | CUTANEOUS | Status: DC
  Administered 2016-01-20 – 2016-01-21 (×2): 1 via NASAL
  Filled 2016-01-20: qty 22

## 2016-01-20 MED ORDER — TIROFIBAN (AGGRASTAT) BOLUS VIA INFUSION
INTRAVENOUS | Status: DC | PRN
  Administered 2016-01-20: 1610 ug via INTRAVENOUS

## 2016-01-20 MED ORDER — SODIUM CHLORIDE 0.9% FLUSH: 3.0000 mL | Freq: Two times a day (BID) | INTRAVENOUS | Status: DC

## 2016-01-20 MED ORDER — HEPARIN SODIUM (PORCINE) 1000 UNIT/ML IJ SOLN
INTRAMUSCULAR | Status: DC | PRN
  Administered 2016-01-20: 2000 [IU] via INTRAVENOUS

## 2016-01-20 MED ORDER — CLOPIDOGREL BISULFATE 300 MG PO TABS
ORAL_TABLET | ORAL | Status: AC
  Filled 2016-01-20: qty 2

## 2016-01-20 MED ORDER — CHLORHEXIDINE GLUCONATE CLOTH 2 % EX PADS
6.0000 | MEDICATED_PAD | Freq: Every day | CUTANEOUS | Status: DC
  Administered 2016-01-21: 6 via TOPICAL

## 2016-01-20 MED ORDER — SODIUM CHLORIDE 0.9% FLUSH: 3.0000 mL | INTRAVENOUS | Status: DC | PRN

## 2016-01-20 MED ORDER — BIVALIRUDIN 250 MG IV SOLR
INTRAVENOUS | Status: AC
  Filled 2016-01-20: qty 250

## 2016-01-20 MED ORDER — MIDAZOLAM HCL 2 MG/2ML IJ SOLN
INTRAMUSCULAR | Status: AC
  Filled 2016-01-20: qty 2

## 2016-01-20 MED ORDER — FENTANYL CITRATE (PF) 100 MCG/2ML IJ SOLN
INTRAMUSCULAR | Status: AC
Start: 2016-01-20 — End: 2016-01-20
  Filled 2016-01-20: qty 2

## 2016-01-20 MED ORDER — FENTANYL CITRATE (PF) 100 MCG/2ML IJ SOLN
INTRAMUSCULAR | Status: DC | PRN
  Administered 2016-01-20 (×3): 50 ug via INTRAVENOUS

## 2016-01-20 MED ORDER — FUROSEMIDE 10 MG/ML IJ SOLN
80.0000 mg | Freq: Once | INTRAMUSCULAR | Status: AC
  Administered 2016-01-20: 80 mg via INTRAVENOUS
  Filled 2016-01-20: qty 8

## 2016-01-20 MED ORDER — IOPAMIDOL (ISOVUE-370) INJECTION 76%
INTRAVENOUS | Status: AC
  Filled 2016-01-20: qty 50

## 2016-01-20 MED ORDER — HEPARIN (PORCINE) IN NACL 2-0.9 UNIT/ML-% IJ SOLN
INTRAMUSCULAR | Status: AC
  Filled 2016-01-20: qty 1000

## 2016-01-20 MED ORDER — IOPAMIDOL (ISOVUE-370) INJECTION 76%
INTRAVENOUS | Status: AC
  Filled 2016-01-20: qty 100

## 2016-01-20 MED ORDER — FENTANYL CITRATE (PF) 100 MCG/2ML IJ SOLN
50.0000 ug | INTRAMUSCULAR | Status: DC | PRN
  Administered 2016-01-20: 50 ug via INTRAVENOUS
  Filled 2016-01-20: qty 2

## 2016-01-20 MED ORDER — HEPARIN SODIUM (PORCINE) 5000 UNIT/ML IJ SOLN
5000.0000 [IU] | Freq: Three times a day (TID) | INTRAMUSCULAR | Status: DC
  Administered 2016-01-21: 5000 [IU] via SUBCUTANEOUS
  Filled 2016-01-20: qty 1

## 2016-01-20 MED ORDER — CLOPIDOGREL BISULFATE 300 MG PO TABS
ORAL_TABLET | ORAL | Status: DC | PRN
  Administered 2016-01-20: 600 mg via ORAL

## 2016-01-20 MED ORDER — TIROFIBAN HCL IN NACL 5-0.9 MG/100ML-% IV SOLN
INTRAVENOUS | Status: AC
  Filled 2016-01-20: qty 100

## 2016-01-20 MED ORDER — HEPARIN SODIUM (PORCINE) 1000 UNIT/ML IJ SOLN
INTRAMUSCULAR | Status: AC
  Filled 2016-01-20: qty 1

## 2016-01-20 MED ORDER — ACETAMINOPHEN 325 MG PO TABS: 650.0000 mg | ORAL_TABLET | ORAL | Status: DC | PRN

## 2016-01-20 MED ORDER — TIROFIBAN HCL IV 12.5 MG/250 ML
0.0750 ug/kg/min | INTRAVENOUS | Status: AC
  Administered 2016-01-20: 0.075 ug/kg/min via INTRAVENOUS
  Filled 2016-01-20: qty 250

## 2016-01-20 MED ORDER — LIDOCAINE HCL (PF) 1 % IJ SOLN
INTRAMUSCULAR | Status: DC | PRN
  Administered 2016-01-20: 19 mL via SUBCUTANEOUS

## 2016-01-20 MED ORDER — IOPAMIDOL (ISOVUE-370) INJECTION 76%
INTRAVENOUS | Status: DC | PRN
  Administered 2016-01-20: 90 mL via INTRA_ARTERIAL

## 2016-01-20 MED ORDER — SODIUM CHLORIDE 0.9% FLUSH
3.0000 mL | Freq: Two times a day (BID) | INTRAVENOUS | Status: DC
  Administered 2016-01-20: 3 mL via INTRAVENOUS

## 2016-01-20 MED ORDER — ORAL CARE MOUTH RINSE
15.0000 mL | Freq: Two times a day (BID) | OROMUCOSAL | Status: DC
Start: 2016-01-20 — End: 2016-01-22
  Administered 2016-01-20 – 2016-01-21 (×3): 15 mL via OROMUCOSAL

## 2016-01-20 MED ORDER — MIDAZOLAM HCL 2 MG/2ML IJ SOLN
INTRAMUSCULAR | Status: AC
  Administered 2016-01-20: 2 mg
  Filled 2016-01-20: qty 4

## 2016-01-20 MED ORDER — FUROSEMIDE 10 MG/ML IJ SOLN
INTRAMUSCULAR | Status: AC
  Filled 2016-01-20: qty 8

## 2016-01-20 MED ORDER — FENTANYL CITRATE (PF) 100 MCG/2ML IJ SOLN
50.0000 ug | INTRAMUSCULAR | Status: AC | PRN
  Administered 2016-01-20 (×3): 50 ug via INTRAVENOUS
  Filled 2016-01-20: qty 2

## 2016-01-20 MED ORDER — MIDAZOLAM HCL 2 MG/2ML IJ SOLN
1.0000 mg | INTRAMUSCULAR | Status: AC | PRN
  Administered 2016-01-20 (×3): 1 mg via INTRAVENOUS
  Filled 2016-01-20 (×2): qty 2

## 2016-01-20 MED ORDER — FENTANYL CITRATE (PF) 100 MCG/2ML IJ SOLN
INTRAMUSCULAR | Status: AC
  Filled 2016-01-20: qty 2

## 2016-01-20 MED ORDER — ONDANSETRON HCL 4 MG/2ML IJ SOLN: 4.0000 mg | Freq: Four times a day (QID) | INTRAMUSCULAR | Status: DC | PRN

## 2016-01-20 MED ORDER — NITROGLYCERIN 1 MG/10 ML FOR IR/CATH LAB
INTRA_ARTERIAL | Status: DC | PRN
  Administered 2016-01-20: 200 ug via INTRACORONARY

## 2016-01-20 MED ORDER — CLOPIDOGREL BISULFATE 75 MG PO TABS
75.0000 mg | ORAL_TABLET | Freq: Every day | ORAL | Status: DC
  Administered 2016-01-21: 75 mg via ORAL
  Filled 2016-01-20: qty 1

## 2016-01-20 MED ORDER — FENTANYL 2500MCG IN NS 250ML (10MCG/ML) PREMIX INFUSION
25.0000 ug/h | INTRAVENOUS | Status: DC
  Administered 2016-01-20: 50 ug/h via INTRAVENOUS
  Filled 2016-01-20 (×2): qty 250

## 2016-01-20 MED ORDER — LIDOCAINE HCL (PF) 1 % IJ SOLN
INTRAMUSCULAR | Status: AC
  Filled 2016-01-20: qty 30

## 2016-01-20 MED ORDER — FENTANYL CITRATE (PF) 100 MCG/2ML IJ SOLN
INTRAMUSCULAR | Status: AC
  Administered 2016-01-20: 100 ug
  Filled 2016-01-20: qty 4

## 2016-01-20 MED ORDER — TIROFIBAN HCL IN NACL 5-0.9 MG/100ML-% IV SOLN
INTRAVENOUS | Status: DC | PRN
  Administered 2016-01-20: 0.075 ug/kg/min via INTRAVENOUS

## 2016-01-20 MED ORDER — CHLORHEXIDINE GLUCONATE 0.12 % MT SOLN
15.0000 mL | Freq: Two times a day (BID) | OROMUCOSAL | Status: DC
  Administered 2016-01-20 – 2016-01-21 (×2): 15 mL via OROMUCOSAL

## 2016-01-20 MED ORDER — SODIUM CHLORIDE 0.9 % IV SOLN
INTRAVENOUS | Status: DC | PRN
  Administered 2016-01-20: 1.75 mg/kg/h via INTRAVENOUS
  Administered 2016-01-20: 1 mg/kg/h via INTRAVENOUS

## 2016-01-20 MED ORDER — FUROSEMIDE 10 MG/ML IJ SOLN
80.0000 mg | Freq: Once | INTRAMUSCULAR | Status: AC
  Administered 2016-01-20: 80 mg via INTRAVENOUS

## 2016-01-20 SURGICAL SUPPLY — 28 items
BALLN ~~LOC~~ TREK RX 3.25X12 (BALLOONS) ×4
BALLOON ~~LOC~~ TREK RX 3.25X12 (BALLOONS) ×2 IMPLANT
CATH BALLN WEDGE 5F 110CM (CATHETERS) ×4 IMPLANT
CATH INFINITI 5FR JL4 (CATHETERS) ×4 IMPLANT
CATH INFINITI JR4 5F (CATHETERS) ×4 IMPLANT
CATH SITESEER 5F MULTI A 2 (CATHETERS) ×4 IMPLANT
CATH SWAN GANZ 7F STRAIGHT (CATHETERS) ×4 IMPLANT
DEVICE WIRE ANGIOSEAL 6FR (Vascular Products) ×4 IMPLANT
DRSG SORBAVIEW 3.5X5-5/16 MED (GAUZE/BANDAGES/DRESSINGS) ×4 IMPLANT
GUIDE CATH RUNWAY 6FR CLS3 (CATHETERS) ×4 IMPLANT
GUIDE CATH RUNWAY 6FR CLS3.5 (CATHETERS) ×4 IMPLANT
GUIDEWIRE .025 260CM (WIRE) ×4 IMPLANT
HOVERMATT SINGLE USE (MISCELLANEOUS) ×4 IMPLANT
KIT ENCORE 26 ADVANTAGE (KITS) ×4 IMPLANT
KIT HEART LEFT (KITS) ×4 IMPLANT
PACK CARDIAC CATHETERIZATION (CUSTOM PROCEDURE TRAY) ×4 IMPLANT
SHEATH FAST CATH BRACH 5F 5CM (SHEATH) ×4 IMPLANT
SHEATH PINNACLE 5F 10CM (SHEATH) ×4 IMPLANT
SHEATH PINNACLE 6F 10CM (SHEATH) ×4 IMPLANT
SHEATH PINNACLE 7F 10CM (SHEATH) ×4 IMPLANT
STENT RESOLUTE ONYX 3.5X15 (Permanent Stent) ×4 IMPLANT
TRANSDUCER W/STOPCOCK (MISCELLANEOUS) ×8 IMPLANT
TRAY CATH 3LUMEN 20C SULFAFREE (CATHETERS) ×4 IMPLANT
TUBING CIL FLEX 10 FLL-RA (TUBING) ×4 IMPLANT
WIRE ASAHI PROWATER 180CM (WIRE) ×4 IMPLANT
WIRE EMERALD 3MM-J .025X260CM (WIRE) ×4 IMPLANT
WIRE EMERALD 3MM-J .035X150CM (WIRE) ×4 IMPLANT
WIRE EMERALD ST .035X150CM (WIRE) ×4 IMPLANT

## 2016-01-20 NOTE — CV Procedure (Signed)
   Complicated procedure and an 82 year old gentleman who presented with non-ST elevation myocardial infarction, acute on chronic systolic heart failure with acute pulmonary edema.  Stage III-IV chronic kidney disease  Right heart catheterization performed. Unsuccessful at getting into the pulmonary artery from either the right antecubital (unable to advance the catheter beyond the shoulder without friction) or right femoral approach (TR jet and tortuosity) due to technical difficulties. We were able to document a right ventricular systolic pressure greater than 55 mmHg. Unable to obtain a wedge pressure which was needed to assess hemodynamic severity of mitral regurgitation.  Coronary angiography demonstrated relatively normal coronaries with 40-50% ostial left main, 50% proximal circumflex 99% mid circumflex. Right coronary has eccentric 40-50% mid stenosis.  Successful DES of mid circumflex from 99% to 0% with TIMI grade 3 flow. Proximal circumflex appears somewhat hazy postprocedure possibly related to abrasion from the stent which we delivered to the mid vessel.   Total 90 cc of contrast used.  Right femoral Angio-Seal.  Right femoral vein exchange for a triple-lumen catheter.

## 2016-01-20 NOTE — Progress Notes (Signed)
Pt transported back from cath lab w/ no apparent complications.  Per ICU RT- Dr Tyson AliasFeinstein wants peep at 8, Vent settings changed.  Pt tol well so far.

## 2016-01-20 NOTE — Progress Notes (Signed)
Patient removed bipap to state " chest hurts can't breathe" Bipap placed back on respiratory status recovered, MD made aware. IV lasix ordered and foley placed. Patient very diaphoretic during this time. Another IV site placed, morphine given.

## 2016-01-20 NOTE — Progress Notes (Signed)
At approximately 0430 patient called out stating that he was short of breath. Upon arrival to patient's room, patient was in distress, diaphoretic, sats in the 70s on a venti mask. Placed NRB mask on patient and obtained EKG. Patient's work of breathing and sats improved on the NRB. Called rapid response and respiratory therapy to the bedside. Patient's lungs still diminished with crackles in the bases. Called Dr. Mayford Knifeurner and informed her of the situation. Received orders for stat ABG and BiPAP.   Patient respiratory rate and sats improved on BiPAP. Patient's current sats are in the mid 90s.  Erin NP on call for cardiology up to assess patient at about 0630. Orders received to titrate nitroglycerin drip down by 20 mcg every 5 minutes until off. Nitro drip stopped at 0700 after titration. Morphine 2 mg given to relieve patient's chest pain.

## 2016-01-20 NOTE — Interval H&P Note (Signed)
Cath Lab Visit (complete for each Cath Lab visit)  Clinical Evaluation Leading to the Procedure:   ACS: Yes.    Non-ACS:  Yes  Anginal Classification: CCS IV  Anti-ischemic medical therapy: No Therapy  Non-Invasive Test Results: No non-invasive testing performed  Prior CABG: No previous CABG      History and Physical Interval Note:  02/08/2016 11:33 AM  Anthony Werner  has presented today for surgery, with the diagnosis of non stemi  The various methods of treatment have been discussed with the patient and family. After consideration of risks, benefits and other options for treatment, the patient has consented to  Procedure(s): Left Heart Cath and Coronary Angiography (N/A) Right Heart Cath (Right) as a surgical intervention .  The patient's history has been reviewed, patient examined, no change in status, stable for surgery.  I have reviewed the patient's chart and labs.  Questions were answered to the patient's satisfaction.     Lyn RecordsHenry W Smith III

## 2016-01-20 NOTE — Progress Notes (Signed)
ANTICOAGULATION CONSULT NOTE - Follow Up Consult  Pharmacy Consult for Heparin  Indication: chest pain/ACS  Allergies  Allergen Reactions  . Atenolol   . Diltiazem   . Hydrochlorothiazide   . Labetalol   . Lovastatin   . Niacin   . Simvastatin   . Terazosin   . Iron     Just had constipation   Patient Measurements: Height: 5\' 6"  (167.6 cm) Weight: 142 lb (64.4 kg) IBW/kg (Calculated) : 63.8  Vital Signs: Temp: 98.2 F (36.8 C) (12/05 1756) Temp Source: Oral (12/05 2015) BP: 115/70 (12/06 0351) Pulse Rate: 85 (12/06 0351)  Labs:  Recent Labs  Nov 19, 2015 2224 Nov 19, 2015 2233 01/19/16 0419 01/19/16 0754 01/19/16 1125 01/19/16 1816 02/02/2016 0401  HGB 10.2*  --  9.9*  --   --   --  9.8*  HCT 31.6*  --  30.6*  --   --   --  30.0*  PLT 223  --  194  --   --   --  214  LABPROT  --   --  17.6*  --   --   --   --   INR  --   --  1.43  --   --   --   --   HEPARINUNFRC  --   --   --  0.20*  --  0.18* 0.31  CREATININE 2.24*  --  2.18*  --  2.26*  --   --   TROPONINI  --  1.25* 1.01*  --   --   --   --     Estimated Creatinine Clearance: 20 mL/min (by C-G formula based on SCr of 2.26 mg/dL (H)).  Assessment: Heparin for CP, procedures deferred until today per cardiology, heparin level therapeutic x 1 after rate increase  Goal of Therapy:  Heparin level 0.3-0.7 units/ml Monitor platelets by anticoagulation protocol: Yes   Plan:  -Cont heparin 1150 units/hr -1200 HL  Earla Charlie 02/03/2016,4:37 AM

## 2016-01-20 NOTE — Procedures (Signed)
Intubation Procedure Note Jac CanavanHarold J Odonnell [id number] 03/27/1924  Procedure: Intubation Indications: Respiratory insufficiency  Procedure Details Consent: Unable to obtain consent because of emergent medical necessity. Time Out: Verified patient identification, verified procedure, site/side was marked, verified correct patient position, special equipment/implants available, medications/allergies/relevent history reviewed, required imaging and test results available.  Performed  Maximum sterile technique was used including gloves, gown, hand hygiene and mask.  MAC and 4    Evaluation Hemodynamic Status: BP stable throughout; O2 sats: stable throughout Patient's Current Condition: stable Complications: No apparent complications Patient did tolerate procedure well. Chest X-ray ordered to verify placement.  CXR: pending.   Nelda BucksFEINSTEIN,DANIEL J. 02/08/2016  afrived in distfress NIMV D/w cafrds To cath lab stat Place ett  .sigdfr

## 2016-01-20 NOTE — Progress Notes (Signed)
Patient Name: Anthony Werner Date of Encounter: 01/19/2016  Primary Cardiologist: Dr. Basil Dess Problem List     Principal Problem:   NSTEMI (non-ST elevated myocardial infarction) [facility]) Active Problems:   CHRONIC KIDNEY DISEASE STAGE III (MODERATE)   Hypertension   Hyperlipidemia   Carotid artery disease (HCC)   Mild cognitive impairment     Subjective   On Bipap, denies chest pain.   Inpatient Medications    Scheduled Meds: . chlorhexidine  15 mL Mouth Rinse BID  . clopidogrel  75 mg Oral Daily  . mouth rinse  15 mL Mouth Rinse q12n4p  . metoprolol tartrate  12.5 mg Oral BID  . pantoprazole  40 mg Oral Daily  . pravastatin  40 mg Oral q1800   Continuous Infusions: . heparin 1,150 Units/hr (01/19/16 1950)  . nitroGLYCERIN 100 mcg/min (01/19/16 2349)   PRN Meds: morphine injection   Vital Signs    Vitals:   01/30/2016 0115 02/10/2016 0138 02/12/2016 0351 02/03/2016 0500  BP:   115/70 108/72  Pulse:   85   Resp:   (!) 21 (!) 28  Temp:      TempSrc:    Axillary  SpO2: (!) 87% 93% 92%   Weight:      Height:        Intake/Output Summary (Last 24 hours) at 02/05/2016 1610 Last data filed at 02/07/2016 0300  Gross per 24 hour  Intake           351.45 ml  Output              600 ml  Net          -248.55 ml   Filed Weights   2016/02/13 2330 01/19/16 1756  Weight: 132 lb 4.4 oz (60 kg) 142 lb (64.4 kg)    Physical Exam    GEN: Elderly male, in no acute distress.  HEENT: Grossly normal.  Neck: Supple,JVD 5-6 cm above clavicle at 45 degrees , No carotid bruits, or masses. Cardiac: RRR,  2/6 systolic murmur,No  rubs, or gallops. No clubbing, cyanosis, edema.  Radials/DP/PT 2+ and equal bilaterally.  Respiratory:  Respirations regular and unlabored, clear to auscultation bilaterally. GI: Soft, nontender, nondistended, BS + x 4. MS: no deformity or atrophy. Skin: warm and dry, no rash. Neuro:  Strength and sensation are intact. Psych: AAOx3.  Normal  affect.  Labs    CBC  Recent Labs  01/19/16 0419 02/03/2016 0401  WBC 7.5 8.9  HGB 9.9* 9.8*  HCT 30.6* 30.0*  MCV 93.6 92.6  PLT 194 214   Basic Metabolic Panel  Recent Labs  01/19/16 1125 01/25/2016 0401  NA 137 136  K 4.5 4.2  CL 106 103  CO2 22 20*  GLUCOSE 95 140*  BUN 65* 67*  CREATININE 2.26* 2.31*  CALCIUM 8.2* 8.5*   Cardiac Enzymes  Recent Labs  02/13/2016 2233 01/19/16 0419 02/09/2016 0401  TROPONINI 1.25* 1.01* 1.47*   BNP Invalid input(s): POCBNP D-Dimer  Recent Labs  01/19/16 2316  DDIMER 0.57*     Telemetry    Afib - Personally Reviewed  ECG    Afib, lateral ST depression- Personally Reviewed  Radiology    Dg Chest 2 View  Result Date: 02-13-16 CLINICAL DATA:  Shortness of breath, productive cough for several days. History of CHF. EXAM: CHEST  2 VIEW COMPARISON:  Chest radiograph April 08, 2011 FINDINGS: Cardiac silhouette is mildly enlarged, calcified aorta. Diffuse interstitial prominence, confluent in the  lung bases. Small pleural effusions. Trachea projects midline and there is no pneumothorax. Soft tissue planes and included osseous structures are nonsuspicious. Osteopenia. IMPRESSION: Interstitial prominence confluent in lung bases concerning for bronchopneumonia. Small pleural effusions. Followup PA and lateral chest X-ray is recommended in 3-4 weeks following trial of antibiotic therapy to ensure resolution and exclude underlying malignancy. Mild cardiomegaly. Electronically Signed   By: Awilda Metroourtnay  Bloomer M.D.   On: 02/25/15 23:00   Dg Chest Port 1 View  Result Date: 01/19/2016 CLINICAL DATA:  Dyspnea EXAM: PORTABLE CHEST 1 VIEW COMPARISON:  02/25/15 FINDINGS: Worsened vascular and interstitial congestive changes. Worsened central and basilar airspace opacities bilaterally. Probable pleural effusions, mildly enlarged from 02/25/15. Unchanged cardiomegaly. IMPRESSION: The findings likely represent worsening congestive heart  failure with interstitial and alveolar edema. Electronically Signed   By: Ellery Plunkaniel R Mitchell M.D.   On: 01/19/2016 23:24    Cardiac Studies   Transthoracic Echocardiography 01/19/16 Study Conclusions  - Left ventricle: The cavity size was normal. Systolic function was   severely reduced. The estimated ejection fraction was 30%. There   was a reduced contribution of atrial contraction to ventricular   filling, due to increased ventricular diastolic pressure or   atrial contractile dysfunction. Doppler parameters are consistent   with a reversible restrictive pattern, indicative of decreased   left ventricular diastolic compliance and/or increased left   atrial pressure (grade 3 diastolic dysfunction). Doppler   parameters are consistent with high ventricular filling pressure.   There was moderate spontaneous echo contrast, indicative of   stasis. - Aortic valve: There appears to be severe AS both visually and by   calculated AVA. The mean gradient is low secondary to reduced   LVF. There was severe stenosis. Valve area (VTI): 1.01 cm^2.   Valve area (Vmax): 1 cm^2. Valve area (Vmean): 1 cm^2. - Mitral valve: Severely calcified annulus. Mildly thickened,   mildly calcified leaflets . There was moderate to severe   regurgitation. - Left atrium: The atrium was severely dilated. - Right ventricle: Systolic function was mildly reduced. - Right atrium: The atrium was mildly dilated. - Tricuspid valve: There was moderate regurgitation. - Pulmonary arteries: PA peak pressure: 45 mm Hg (S). - Recommendations: limited study with definity contrast to rule out   apical thrombus and better assess wall motion.  Impressions:  - The right ventricular systolic pressure was increased consistent   with moderate pulmonary hypertension.  Recommendations:  limited study with definity contrast to rule out apical thrombus and better assess wall motion.   Patient Profile     82 yo with  preexisting mild cardiomyopathy, carotid stenosis (occluded RICA, s/p CEA LICA 2012), CKD 3, HTN presents with about 2 months of effort angina, now progressing to angina at rest an NSTEMI. Creat 2.18 is slightly above baseline (1.6-1.8). Echo revealed severe AS, reduced EF of 30%.   Assessment & Plan    1. NSTEMI: Plan for cath, but with rising creatinine cath was deferred. Echo shows reduced EF, will need cath today, but not sure he is stable enough respiratory wise currently. He is on Bipap, question his ability to lie flat. Will need to assess aortic valve by cath, also could do right heart cath as well.   2. CKD 3: Creatinine worsening.   3. Acute on chronic systolic and diastolic CHF: Echo report above. EF is 30%, severe AS, severe MR. Decompensated overnight, required Bipap, IV fluids stopped. IV Lasix given. Stable now on Bipap. Was on 100mcg of Nitro  gtt, wean off immediately.   Needs more IV diuresis, will do 40mg  IV BID.   4. HTN: controlled.  5. Occluded R carotid, s/p L CEA  6. HLP: excellent lipids 04/2069, LDL 67  7. Severe Aortic stenosis: Will assess further by cath when stable.     Signed, Little IshikawaErin E Smith, NP  01/24/2016, 6:21 AM   I have seen and examined the patient along with Little IshikawaErin E Smith, NP.  I have reviewed the chart, notes and new data.  I agree with NP's note.  Key new complaints: severely dyspneic overnight and now, on BiPAP.  Key examination changes: Borderline BP, looks fatigued, but remains oriented. In pulmonary edema. Key new findings / data: reviewed echo. Agree that EF is 30%, but I believe there are clear regional abnormalities in the LAD and LCX territories. There is at least moderate AS and probably severe MR. I do not see an LV thrombus.  PLAN: He is critically ill.  He presented with unstable angina and small NSTEMI, developed CHF due to IV fluid hydration I suspect there is severe CAD with widespread ischemic LV dysfunction as the major problem.  There is also at least moderate AS and probably severe MR (the latter could be secondary to ischemic LV dysfunction). Will administer another dose of diuretic, but expect he will continue to deteriorate without intervention. Hopefully can optimize respiratory status so tht he can lie flat. We may need to intubate him. The patient is agreeable to intubation. His family is not here (have left messages). While there is a mention of mild cognitive dysfunction in his records, I believe he is oriented and can make his own decisions.  Thurmon FairMihai Tattiana Fakhouri, MD, [facility] Hospital[facility]ACC [facility] [phone] 01/31/2016, 8:27 AM

## 2016-01-20 NOTE — Progress Notes (Signed)
Paged Dr. Mayford Knifeurner at approximately 2200 related to patient's oxygen saturations maintaining 88-89% on 6LNC. Patient does not appear to be in distress at this time. Lungs are diminished with crackles in the right base. Patient states his breathing feels "tight." Received orders from Dr. Mayford Knifeurner for a stat d-dimer level and a portable chest xray. Also received orders to discontinue patient's normal saline fluids.  At 0040; paged Dr. Mayford Knifeurner with results of chest xray and d-dimer of 0.57. Patient's sats 88-89% on 6LNC. Patient still states breathing feels "tight." Placed patient on the venti mask with an improvement in sats to 93 %. Received orders from Dr. Mayford Knifeurner to give 80 mg IV lasix x1.

## 2016-01-20 NOTE — Consult Note (Signed)
PULMONARY / CRITICAL CARE MEDICINE   Name: Anthony CanavanHarold J Werner MRN: [id number] DOB: 11/29/1924    ADMISSION DATE:  01/25/2016 CONSULTATION DATE: 12/6  REFERRING MD:  Cards  CHIEF COMPLAINT:  SOB  HISTORY OF PRESENT ILLNESS:   Anthony Werner is a 82 yo AAM with known CHF 30%, HTN, CKD, who was admitted 12/5 for 2 months of chest pain that worsened 24 hours prior to admit. Trop 1.25 on admit and increased 1.47 12/6. During early am of 12/6 he had increased sob(despite neg I/o from lasix)and failed bipap attempt. He was moved by RRT to 2304 and requires urgent intubation per Dr. Tyson Werner at 1000. Cards has asked PCCM to assist in his care. He is scheduled for r/l heart cath 12/6.   PAST MEDICAL HISTORY :  He  has a past medical history of Anemia of chronic disease (04/2009); BPH (benign prostatic hyperplasia); Carotid artery disease (HCC); Chest pain; CHF (congestive heart failure) (HCC) (04/07/09); CKD (chronic kidney disease), stage II; Community acquired pneumonia; Ejection fraction; Esophageal stricture; GERD (gastroesophageal reflux disease); Gout; Hyperlipidemia; Hypertension; Impaired fasting glucose; and NSTEMI (non-ST elevated myocardial infarction) (HCC) (01/19/2016).  PAST SURGICAL HISTORY: He  has a past surgical history that includes Cataract extraction (2008); Hernia repair (2007); Tonsillectomy; Esophageal dilation; Carotid endarterectomy (LEFT); Iron Injections (Sept. 2013 , Aug. 2014); and Eye surgery (Right, 09-11-13).  Allergies  Allergen Reactions  . Atenolol   . Diltiazem   . Hydrochlorothiazide   . Labetalol   . Lovastatin   . Niacin   . Simvastatin   . Terazosin   . Iron     Just had constipation    Current Facility-Administered Medications on File Prior to Encounter  Medication  . ferumoxytol [facility](FERAHEME) injection 510 mg   Current Outpatient Prescriptions on File Prior to Encounter  Medication Sig  . amLODipine (NORVASC) 10 MG tablet Take 10 mg by mouth daily.  Marland Kitchen.  aspirin 81 MG tablet Take 81 mg by mouth daily.    Marland Kitchen. b complex vitamins capsule Take 1 capsule by mouth daily. Plus  C  . carvedilol (COREG) 12.5 MG tablet TAKE 1 TABLET BY MOUTH TWICE A DAY WITH A MEAL  . cholecalciferol (VITAMIN D) 1000 UNITS tablet Take 1,000 Units by mouth daily.  . clopidogrel (PLAVIX) 75 MG tablet TAKE 1 TABLET (75 MG TOTAL) BY MOUTH DAILY.  . furosemide (LASIX) 40 MG tablet TAKE 1 TABLET (40 MG TOTAL) BY MOUTH DAILY, MAY TAKE 2ND DOSE AT LUNCHTIME IF FEET ARE SWOLLEN  . isosorbide mononitrate (IMDUR) 30 MG 24 hr tablet TAKE 1 TABLET BY MOUTH EVERY DAY  . lisinopril (PRINIVIL,ZESTRIL) 20 MG tablet TAKE 1 TABLET BY MOUTH EVERY DAY  . pantoprazole (PROTONIX) 40 MG tablet Take 1 tablet (40 mg total) by mouth daily.  . pravastatin (PRAVACHOL) 40 MG tablet TAKE 1 TABLET (40 MG TOTAL) BY MOUTH DAILY.  Marland Kitchen. prednisoLONE acetate (PRED FORTE) 1 % ophthalmic suspension Place 1 drop into the right eye daily.     FAMILY HISTORY:  His indicated that his mother is deceased. He indicated that his father is deceased. He indicated that his sister is deceased. He indicated that his brother is deceased. He indicated that his daughter is alive.    SOCIAL HISTORY: He  reports that he quit smoking about 48 years ago. His smoking use included Cigarettes. He has never used smokeless tobacco. He reports that he does not drink alcohol or use drugs.  REVIEW OF SYSTEMS:   NA  SUBJECTIVE:  Incubated  VITAL SIGNS: BP 110/67 (BP Location: Right Arm)   Pulse 82   Temp 97.9 F (36.6 C) (Axillary)   Resp 15   Ht 5\' 6"  (1.676 m)   Wt 142 lb (64.4 kg)   SpO2 97%   BMI 22.92 kg/m   HEMODYNAMICS:    VENTILATOR SETTINGS: FiO2 (%):  [4 %-55 %] 55 %  INTAKE / OUTPUT: I/O last 3 completed shifts: In: 851.5 [P.O.:60; I.V.:291.5; IV Piggyback:500] Out: 600 [Urine:600]  PHYSICAL EXAMINATION: General:  Sedated on vent Neuro:  Intact prior to intubation HEENT: + jvd, no lan Cardiovascular:   HSR Lungs:  Decreased bs bases Abdomen:  Soft +bs Musculoskeletal: intact Skin:Cool, toes with changes PVD  LABS:  BMET  Recent Labs Lab 01/19/16 0419 01/19/16 1125 January 28, 2016 0401  NA 136 137 136  K 4.8 4.5 4.2  CL 103 106 103  CO2 19* 22 20*  BUN 65* 65* 67*  CREATININE 2.18* 2.26* 2.31*  GLUCOSE 111* 95 140*    Electrolytes  Recent Labs Lab 01/19/16 0419 01/19/16 1125 January 28, 2016 0401  CALCIUM 8.8* 8.2* 8.5*    CBC  Recent Labs Lab 02/03/2016 2224 01/19/16 0419 01/28/2016 0401  WBC 7.4 7.5 8.9  HGB 10.2* 9.9* 9.8*  HCT 31.6* 30.6* 30.0*  PLT 223 194 214    Coag's  Recent Labs Lab 01/19/16 0419  INR 1.43    Sepsis Markers  Recent Labs Lab 01/20/2016 2338 01/19/16 0221  LATICACIDVEN 2.03* 2.02*    ABG  Recent Labs Lab Jan 28, 2016 0520  PHART 7.441  PCO2ART 27.3*  PO2ART 51.2*    Liver Enzymes No results for input(s): AST, ALT, ALKPHOS, BILITOT, ALBUMIN in the last 168 hours.  Cardiac Enzymes  Recent Labs Lab 02/13/2016 2233 01/19/16 0419 January 28, 2016 0401  TROPONINI 1.25* 1.01* 1.47*    Glucose No results for input(s): GLUCAP in the last 168 hours.  Imaging Dg Chest Port 1 View  Result Date: 01/19/2016 CLINICAL DATA:  Dyspnea EXAM: PORTABLE CHEST 1 VIEW COMPARISON:  01/26/2016 FINDINGS: Worsened vascular and interstitial congestive changes. Worsened central and basilar airspace opacities bilaterally. Probable pleural effusions, mildly enlarged from 02/10/2016. Unchanged cardiomegaly. IMPRESSION: The findings likely represent worsening congestive heart failure with interstitial and alveolar edema. Electronically Signed   By: Anthony Werner M.D.   On: 01/19/2016 23:24     STUDIES:  12/6 Cath>>  CULTURES:   ANTIBIOTICS:   SIGNIFICANT EVENTS: 12/6 intubated  LINES/TUBES: 12/6 ett>>  DISCUSSION: MR. Anthony Werner is a 82 yo AAM with known CHF 30%, HTN, CKD, who was admitted 12/5 for 2 months of chest pain that worsened 24 hours prior  to admit. Trop 1.25 on admit and increased 1.47 12/6. During early am of 12/6 he had increased sob(despite neg I/o from lasix)and failed bipap attempt. He was moved by RRT to 2304 and requires urgent intubation per Dr. Tyson Alias at 1000. Cards has asked PCCM to assist in his care. He is scheduled for r/l heart cath 12/6.   ASSESSMENT / PLAN:  PULMONARY A: Acute resp distress 12/6 with flash pulmonary edema in setting NSTEMI. P:   Intubated 12/6 Follow up abg to guide vent settings CxR for evaluation ET and monitor edema  CARDIOVASCULAR A:  NSTEMI with trop 1.25 on admit and rising CHF last ef 30% Hypotension post intubation PVD P:  Cards is controlling Intubated should help CI improve Rt & Lt Heart cath planned for 12/6   RENAL Lab Results  Component Value Date   CREATININE 2.31 (  H) 02/08/2016   CREATININE 2.26 (H) 01/19/2016   CREATININE 2.18 (H) 01/19/2016    A:   Chronic renal failure Diuresis 12/5 P:   Monitor creatine Avoid nephrotoxins  GASTROINTESTINAL A:   Hx of esophageal stricture P:   PPI OGT as tolerated NPO for now  HEMATOLOGIC  Recent Labs  01/19/16 0419 02/07/2016 0401  HGB 9.9* 9.8*    A:   Chronic anemia P:  Transfuse per protocol   INFECTIOUS A:   No overt infectious process P:   Monitor  ENDOCRINE A:   Hyperglycemia P:   SSI  NEUROLOGIC A:   Awake and MAEx4 prior to intubation Vascular dementia P:   RASS goal: -1 Sedation as needed  FAMILY  - Updates: No family at bedside  - Inter-disciplinary family meet or Palliative Care meeting due by:  day 7  CCT= 45 min  Brett CanalesSteve Minor ACNP Adolph PollackLe Bauer PCCM Pager [phone] till 3 pm If no answer page [phone] 01/16/2016, 10:04 AM   STAFF NOTE: I, Rory Percyaniel Jailon Schaible, MD FACP have personally reviewed patient's available data, including medical history, events of note, physical examination and test results as part of my evaluation. I have discussed with resident/NP and other care  providers such as pharmacist, RN and RRT. In addition, I personally evaluated patient and elicited key findings of: awake, distress on NIMV, crackles bilateral, abdo soft, edema noted on lower ext, not tachy, pcxr with frank pulm edema appearing, trop elevated, complained of chest pain on admission, crt slight down, pt requires emergent cath per cards, will place ett, 8 cc/kg, abg to follow, stat pcxr, bp drop, bolus given , would hold further bolus and support with neo if needed, will likely need cvl also, NPO, holding lasix given BP and crt noted and about to receive contrast, consider bicarb per cath  To prevent further injury, he now is agitated, add fent drip, we will need to discuss further goals of care with mods., likley will need cvp and lasix, pending cath result, also low threshold to start asp nosocomial abx The patient is critically ill with multiple organ systems failure and requires high complexity decision making for assessment and support, frequent evaluation and titration of therapies, application of advanced monitoring technologies and extensive interpretation of multiple databases.   Critical Care Time devoted to patient care services described in this note is 35 Minutes. This time reflects time of care of this signee: Rory Percyaniel Babita Amaker, MD FACP. This critical care time does not reflect procedure time, or teaching time or supervisory time of PA/NP/Med student/Med Resident etc but could involve care discussion time. Rest per NP/medical resident whose note is outlined above and that I agree with   Mcarthur Rossettianiel J. Anthony AliasFeinstein, MD, FACP Pgr: [phone] [facility] 02/04/2016 1:04 PM

## 2016-01-20 NOTE — Consult Note (Signed)
            [facility]igator  01/23/2016  Jac CanavanHarold J Werner 01/26/1925 [id number]   Went to see patient in the room to identify possible discharge needs but RN states he was transferred to [facility] 04 for intubation and will not be back to the unit.  Will attempt to follow-up patient at a later date when more stable and out of ICU.    For additional questions please contact:  Karin GoldenLorraine A. Drago Hammonds, BSN, RN-BC [facility] PRIMARY [facility] Navigator Cell: [phone]

## 2016-01-20 NOTE — Significant Event (Signed)
Rapid Response Event Note RN called for a second set of eyes in regard to desating to 70% on venti mask Overview:  Time Called: 0459 Arrival Time: 0451 Event Type: Respiratory  Initial Focused Assessment: Upon arrival pt alert and oriented, skin warm and dry, on a NRB pt had been diaphoretic prior to arrival, increased WOB, Bedside RN had been in contact with cardiology through out the night in regards to pt SOB. Order had been given and completed PTA. No accessory muscles noted, pt in some distress, lungs diminished through out with crackles in the base. Lasix 80 mg IVP given at 0130 resulting in 250 cc output. Bladder scanner showed a volume of 225 cc remaining.  BP 108/72, HR 83, RR 28, 97% NRB, Pt switched to 5L , able to maintain sats 96-98%.   Interventions: ABG, placed on Bipap Plan of Care (if not transferred): Continue to monitor, Cardiology PA to bedside for assessment. Report [facility] to [facility]  Event Summary: Name of Physician Notified: Armanda Magicraci Turner  at 0510    at    Outcome: Stayed in room and stabalized     [place], Tamber Burtch Boynton BeachPaige

## 2016-01-20 NOTE — Interval H&P Note (Signed)
History and Physical Interval Note:  01/18/2016 11:33 AM  Anthony Werner  has presented today for surgery, with the diagnosis of non stemi  The various methods of treatment have been discussed with the patient and family. After consideration of risks, benefits and other options for treatment, the patient has consented to  Procedure(s): Left Heart Cath and Coronary Angiography (N/A) Right Heart Cath (Right) as a surgical intervention .  The patient's history has been reviewed, patient examined, no change in status, stable for surgery.  I have reviewed the patient's chart and labs.  Questions were answered to the patient's satisfaction.     Lyn RecordsHenry W Kjell Brannen III

## 2016-01-20 NOTE — Significant Event (Signed)
Taken to cath lab via bed with transport Lucendia HerrlichFaye and RT [place]. Report given to cath lab staff.

## 2016-01-20 NOTE — Progress Notes (Signed)
eLink Physician-Brief Progress Note Patient Name: Jac CanavanHarold J Neubert DOB: 06/14/1924 MRN: [id number]   Date of Service  01/23/2016  HPI/Events of Note  Agitation  eICU Interventions  Fentanyl drip     Intervention Category Major Interventions: Other:  YACOUB,WESAM 01/31/2016, 6:53 PM

## 2016-01-20 NOTE — Significant Event (Signed)
Rapid Response Event Note  Overview: Time Called: 0904 Arrival Time: 0907 Event Type: Respiratory  Initial Focused Assessment: Patient with intermittent respiratory distress on bipap.  Per RN he becomes very labored and rips off the bipap. Currently calm and breathing easily on Bipap.  Patient alert and oriented. Recently received 80 mg lasix IV, Foley placed  Interventions: 2mg  Morphine given Urgently transferred to 2S04 via bed with Bipap. Dr Tyson AliasFeinstein at bedside upon arrival Patient intubated  Plan of Care (if not transferred):  Event Summary: Name of Physician Notified: Croitoru at bedside at 0510  Name of Consulting Physician Notified: Dr Tyson AliasFeinstein at [phone]  Outcome: Transferred (Comment)  Event End Time: 1000  Marcellina MillinLayton, Eeva Schlosser

## 2016-01-20 NOTE — Progress Notes (Signed)
ANTICOAGULATION CONSULT NOTE - Follow Up Consult  Pharmacy Consult for Tirofiban Indication: chest pain/ACS  Allergies  Allergen Reactions  . Atenolol   . Diltiazem   . Hydrochlorothiazide   . Labetalol   . Lovastatin   . Niacin   . Simvastatin   . Terazosin   . Iron     Just had constipation   Patient Measurements: Height: 5\' 7"  (170.2 cm) Weight: 142 lb (64.4 kg) IBW/kg (Calculated) : 66.1  Vital Signs: Temp: 98.8 F (37.1 C) (12/06 1055) Temp Source: Axillary (12/06 1055) BP: 92/61 (12/06 1500) Pulse Rate: 80 (12/06 1500)  Labs:  Recent Labs  02/10/2016 2224 01/25/2016 2233 01/19/16 0419 01/19/16 0754 01/19/16 1125 01/19/16 1816 Sep 20, 2015 0401  HGB 10.2*  --  9.9*  --   --   --  9.8*  HCT 31.6*  --  30.6*  --   --   --  30.0*  PLT 223  --  194  --   --   --  214  LABPROT  --   --  17.6*  --   --   --   --   INR  --   --  1.43  --   --   --   --   HEPARINUNFRC  --   --   --  0.20*  --  0.18* 0.31  CREATININE 2.24*  --  2.18*  --  2.26*  --  2.31*  TROPONINI  --  1.25* 1.01*  --   --   --  1.47*    Estimated Creatinine Clearance: 19.7 mL/min (by C-G formula based on SCr of 2.31 mg/dL (H)).  Assessment: Patient s/p cath, severe CAD with total occlusion of mid cfx. DES placed and patient loaded with clopidogrel. Will continue tirofiban for 18 hours and dose adjust for renal dysfunction.   Goal of Therapy:  Monitor platelets by anticoagulation protocol: Yes   Plan:  Tirofiban 0.011075mcg/kg/min x 18 hours CBC tonight to follow pltc   Sheppard CoilFrank Wilson PharmD., BCPS Clinical Pharmacist Pager [phone] 02/13/2016 3:28 PM

## 2016-01-20 NOTE — H&P (View-Only) (Signed)
Patient Name: Anthony Werner Date of Encounter: 01/29/2016  Primary Cardiologist: Dr. Basil Dess Problem List     Principal Problem:   NSTEMI (non-ST elevated myocardial infarction) [facility]) Active Problems:   CHRONIC KIDNEY DISEASE STAGE III (MODERATE)   Hypertension   Hyperlipidemia   Carotid artery disease (HCC)   Mild cognitive impairment     Subjective   On Bipap, denies chest pain.   Inpatient Medications    Scheduled Meds: . chlorhexidine  15 mL Mouth Rinse BID  . clopidogrel  75 mg Oral Daily  . mouth rinse  15 mL Mouth Rinse q12n4p  . metoprolol tartrate  12.5 mg Oral BID  . pantoprazole  40 mg Oral Daily  . pravastatin  40 mg Oral q1800   Continuous Infusions: . heparin 1,150 Units/hr (01/19/16 1950)  . nitroGLYCERIN 100 mcg/min (01/19/16 2349)   PRN Meds: morphine injection   Vital Signs    Vitals:   01/19/2016 0115 01/31/2016 0138 01/31/2016 0351 01/29/2016 0500  BP:   115/70 108/72  Pulse:   85   Resp:   (!) 21 (!) 28  Temp:      TempSrc:    Axillary  SpO2: (!) 87% 93% 92%   Weight:      Height:        Intake/Output Summary (Last 24 hours) at 02/04/2016 1610 Last data filed at 01/15/2016 0300  Gross per 24 hour  Intake           351.45 ml  Output              600 ml  Net          -248.55 ml   Filed Weights   2016/02/13 2330 01/19/16 1756  Weight: 132 lb 4.4 oz (60 kg) 142 lb (64.4 kg)    Physical Exam    GEN: Elderly male, in no acute distress.  HEENT: Grossly normal.  Neck: Supple,JVD 5-6 cm above clavicle at 45 degrees , No carotid bruits, or masses. Cardiac: RRR,  2/6 systolic murmur,No  rubs, or gallops. No clubbing, cyanosis, edema.  Radials/DP/PT 2+ and equal bilaterally.  Respiratory:  Respirations regular and unlabored, clear to auscultation bilaterally. GI: Soft, nontender, nondistended, BS + x 4. MS: no deformity or atrophy. Skin: warm and dry, no rash. Neuro:  Strength and sensation are intact. Psych: AAOx3.  Normal  affect.  Labs    CBC  Recent Labs  01/19/16 0419 01/26/2016 0401  WBC 7.5 8.9  HGB 9.9* 9.8*  HCT 30.6* 30.0*  MCV 93.6 92.6  PLT 194 214   Basic Metabolic Panel  Recent Labs  01/19/16 1125 01/18/2016 0401  NA 137 136  K 4.5 4.2  CL 106 103  CO2 22 20*  GLUCOSE 95 140*  BUN 65* 67*  CREATININE 2.26* 2.31*  CALCIUM 8.2* 8.5*   Cardiac Enzymes  Recent Labs  02/13/2016 2233 01/19/16 0419 02/07/2016 0401  TROPONINI 1.25* 1.01* 1.47*   BNP Invalid input(s): POCBNP D-Dimer  Recent Labs  01/19/16 2316  DDIMER 0.57*     Telemetry    Afib - Personally Reviewed  ECG    Afib, lateral ST depression- Personally Reviewed  Radiology    Dg Chest 2 View  Result Date: 02-13-16 CLINICAL DATA:  Shortness of breath, productive cough for several days. History of CHF. EXAM: CHEST  2 VIEW COMPARISON:  Chest radiograph April 08, 2011 FINDINGS: Cardiac silhouette is mildly enlarged, calcified aorta. Diffuse interstitial prominence, confluent in the  lung bases. Small pleural effusions. Trachea projects midline and there is no pneumothorax. Soft tissue planes and included osseous structures are nonsuspicious. Osteopenia. IMPRESSION: Interstitial prominence confluent in lung bases concerning for bronchopneumonia. Small pleural effusions. Followup PA and lateral chest X-ray is recommended in 3-4 weeks following trial of antibiotic therapy to ensure resolution and exclude underlying malignancy. Mild cardiomegaly. Electronically Signed   By: Awilda Metroourtnay  Bloomer M.D.   On: 02/25/15 23:00   Dg Chest Port 1 View  Result Date: 01/19/2016 CLINICAL DATA:  Dyspnea EXAM: PORTABLE CHEST 1 VIEW COMPARISON:  02/25/15 FINDINGS: Worsened vascular and interstitial congestive changes. Worsened central and basilar airspace opacities bilaterally. Probable pleural effusions, mildly enlarged from 02/25/15. Unchanged cardiomegaly. IMPRESSION: The findings likely represent worsening congestive heart  failure with interstitial and alveolar edema. Electronically Signed   By: Ellery Plunkaniel R Mitchell M.D.   On: 01/19/2016 23:24    Cardiac Studies   Transthoracic Echocardiography 01/19/16 Study Conclusions  - Left ventricle: The cavity size was normal. Systolic function was   severely reduced. The estimated ejection fraction was 30%. There   was a reduced contribution of atrial contraction to ventricular   filling, due to increased ventricular diastolic pressure or   atrial contractile dysfunction. Doppler parameters are consistent   with a reversible restrictive pattern, indicative of decreased   left ventricular diastolic compliance and/or increased left   atrial pressure (grade 3 diastolic dysfunction). Doppler   parameters are consistent with high ventricular filling pressure.   There was moderate spontaneous echo contrast, indicative of   stasis. - Aortic valve: There appears to be severe AS both visually and by   calculated AVA. The mean gradient is low secondary to reduced   LVF. There was severe stenosis. Valve area (VTI): 1.01 cm^2.   Valve area (Vmax): 1 cm^2. Valve area (Vmean): 1 cm^2. - Mitral valve: Severely calcified annulus. Mildly thickened,   mildly calcified leaflets . There was moderate to severe   regurgitation. - Left atrium: The atrium was severely dilated. - Right ventricle: Systolic function was mildly reduced. - Right atrium: The atrium was mildly dilated. - Tricuspid valve: There was moderate regurgitation. - Pulmonary arteries: PA peak pressure: 45 mm Hg (S). - Recommendations: limited study with definity contrast to rule out   apical thrombus and better assess wall motion.  Impressions:  - The right ventricular systolic pressure was increased consistent   with moderate pulmonary hypertension.  Recommendations:  limited study with definity contrast to rule out apical thrombus and better assess wall motion.   Patient Profile     82 yo with  preexisting mild cardiomyopathy, carotid stenosis (occluded RICA, s/p CEA LICA 2012), CKD 3, HTN presents with about 2 months of effort angina, now progressing to angina at rest an NSTEMI. Creat 2.18 is slightly above baseline (1.6-1.8). Echo revealed severe AS, reduced EF of 30%.   Assessment & Plan    1. NSTEMI: Plan for cath, but with rising creatinine cath was deferred. Echo shows reduced EF, will need cath today, but not sure he is stable enough respiratory wise currently. He is on Bipap, question his ability to lie flat. Will need to assess aortic valve by cath, also could do right heart cath as well.   2. CKD 3: Creatinine worsening.   3. Acute on chronic systolic and diastolic CHF: Echo report above. EF is 30%, severe AS, severe MR. Decompensated overnight, required Bipap, IV fluids stopped. IV Lasix given. Stable now on Bipap. Was on 100mcg of Nitro  gtt, wean off immediately.   Needs more IV diuresis, will do 40mg  IV BID.   4. HTN: controlled.  5. Occluded R carotid, s/p L CEA  6. HLP: excellent lipids 04/2069, LDL 67  7. Severe Aortic stenosis: Will assess further by cath when stable.     Signed, Little IshikawaErin E Smith, NP  01/24/2016, 6:21 AM   I have seen and examined the patient along with Little IshikawaErin E Smith, NP.  I have reviewed the chart, notes and new data.  I agree with NP's note.  Key new complaints: severely dyspneic overnight and now, on BiPAP.  Key examination changes: Borderline BP, looks fatigued, but remains oriented. In pulmonary edema. Key new findings / data: reviewed echo. Agree that EF is 30%, but I believe there are clear regional abnormalities in the LAD and LCX territories. There is at least moderate AS and probably severe MR. I do not see an LV thrombus.  PLAN: He is critically ill.  He presented with unstable angina and small NSTEMI, developed CHF due to IV fluid hydration I suspect there is severe CAD with widespread ischemic LV dysfunction as the major problem.  There is also at least moderate AS and probably severe MR (the latter could be secondary to ischemic LV dysfunction). Will administer another dose of diuretic, but expect he will continue to deteriorate without intervention. Hopefully can optimize respiratory status so tht he can lie flat. We may need to intubate him. The patient is agreeable to intubation. His family is not here (have left messages). While there is a mention of mild cognitive dysfunction in his records, I believe he is oriented and can make his own decisions.  Thurmon FairMihai Hanya Guerin, MD, [facility] [phone] 01/31/2016, 8:27 AM

## 2016-01-21 ENCOUNTER — Encounter (HOSPITAL_COMMUNITY): Payer: Self-pay | Admitting: Interventional Cardiology

## 2016-01-21 ENCOUNTER — Inpatient Hospital Stay (HOSPITAL_COMMUNITY): Payer: Medicare Other

## 2016-01-21 DIAGNOSIS — N179 Acute kidney failure, unspecified: Secondary | ICD-10-CM

## 2016-01-21 DIAGNOSIS — I350 Nonrheumatic aortic (valve) stenosis: Secondary | ICD-10-CM

## 2016-01-21 DIAGNOSIS — I340 Nonrheumatic mitral (valve) insufficiency: Secondary | ICD-10-CM

## 2016-01-21 DIAGNOSIS — I480 Paroxysmal atrial fibrillation: Secondary | ICD-10-CM

## 2016-01-21 DIAGNOSIS — N189 Chronic kidney disease, unspecified: Secondary | ICD-10-CM

## 2016-01-21 DIAGNOSIS — J9600 Acute respiratory failure, unspecified whether with hypoxia or hypercapnia: Secondary | ICD-10-CM

## 2016-01-21 DIAGNOSIS — R570 Cardiogenic shock: Secondary | ICD-10-CM

## 2016-01-21 LAB — CBC
HCT: 28.4 % — ABNORMAL LOW (ref 39.0–52.0)
Hemoglobin: 9.1 g/dL — ABNORMAL LOW (ref 13.0–17.0)
MCH: 30.5 pg (ref 26.0–34.0)
MCHC: 32 g/dL (ref 30.0–36.0)
MCV: 95.3 fL (ref 78.0–100.0)
PLATELETS: 203 10*3/uL (ref 150–400)
RBC: 2.98 MIL/uL — AB (ref 4.22–5.81)
RDW: 16.9 % — ABNORMAL HIGH (ref 11.5–15.5)
WBC: 9.7 10*3/uL (ref 4.0–10.5)

## 2016-01-21 LAB — POCT I-STAT 3, ART BLOOD GAS (G3+)
Acid-base deficit: 15 mmol/L — ABNORMAL HIGH (ref 0.0–2.0)
Bicarbonate: 13.1 mmol/L — ABNORMAL LOW (ref 20.0–28.0)
O2 Saturation: 100 %
PH ART: 7.186 — AB (ref 7.350–7.450)
TCO2: 14 mmol/L (ref 0–100)
pCO2 arterial: 33.5 mmHg (ref 32.0–48.0)
pO2, Arterial: 252 mmHg — ABNORMAL HIGH (ref 83.0–108.0)

## 2016-01-21 LAB — GLUCOSE, CAPILLARY
GLUCOSE-CAPILLARY: 62 mg/dL — AB (ref 65–99)
Glucose-Capillary: 86 mg/dL (ref 65–99)
Glucose-Capillary: 32 mg/dL — CL (ref 65–99)
Glucose-Capillary: 95 mg/dL (ref 65–99)
Glucose-Capillary: 106 mg/dL — ABNORMAL HIGH (ref 65–99)
Glucose-Capillary: 78 mg/dL (ref 65–99)

## 2016-01-21 LAB — BASIC METABOLIC PANEL
ANION GAP: 13 (ref 5–15)
BUN: 80 mg/dL — ABNORMAL HIGH (ref 6–20)
CHLORIDE: 104 mmol/L (ref 101–111)
CO2: 19 mmol/L — ABNORMAL LOW (ref 22–32)
CREATININE: 2.81 mg/dL — AB (ref 0.61–1.24)
Calcium: 7.9 mg/dL — ABNORMAL LOW (ref 8.9–10.3)
GFR calc non Af Amer: 19 mL/min — ABNORMAL LOW (ref >60)
GFR, EST AFRICAN AMERICAN: 21 mL/min — AB (ref >60)
Glucose, Bld: 113 mg/dL — ABNORMAL HIGH (ref 65–99)
POTASSIUM: 4.7 mmol/L (ref 3.5–5.1)
Sodium: 136 mmol/L (ref 135–145)

## 2016-01-21 LAB — PHOSPHORUS
PHOSPHORUS: 8.5 mg/dL — AB (ref 2.5–4.6)
Phosphorus: 6.9 mg/dL — ABNORMAL HIGH (ref 2.5–4.6)

## 2016-01-21 LAB — MAGNESIUM
MAGNESIUM: 2.5 mg/dL — AB (ref 1.7–2.4)
MAGNESIUM: 2.8 mg/dL — AB (ref 1.7–2.4)

## 2016-01-21 LAB — CORTISOL: CORTISOL PLASMA: 32.6 ug/dL

## 2016-01-21 MED ORDER — VITAL HIGH PROTEIN PO LIQD
1000.0000 mL | ORAL | Status: DC
  Administered 2016-01-21: 1000 mL

## 2016-01-21 MED ORDER — EPINEPHRINE PF 1 MG/10ML IJ SOSY
PREFILLED_SYRINGE | INTRAMUSCULAR | Status: AC
  Filled 2016-01-21: qty 10

## 2016-01-21 MED ORDER — STERILE WATER FOR INJECTION IV SOLN
INTRAVENOUS | Status: DC
  Filled 2016-01-21 (×2): qty 850

## 2016-01-21 MED ORDER — NOREPINEPHRINE BITARTRATE 1 MG/ML IV SOLN
2.0000 ug/min | INTRAVENOUS | Status: DC
  Filled 2016-01-21: qty 4

## 2016-01-21 MED ORDER — ATROPINE SULFATE 1 MG/10ML IJ SOSY
PREFILLED_SYRINGE | INTRAMUSCULAR | Status: AC
  Administered 2016-01-21: 1 mg
  Filled 2016-01-21: qty 10

## 2016-01-21 MED ORDER — DEXTROSE 5 % IV SOLN
0.5000 ug/min | INTRAVENOUS | Status: DC
  Administered 2016-01-21: 5 ug/min via INTRAVENOUS
  Filled 2016-01-21: qty 4

## 2016-01-21 MED ORDER — SODIUM CHLORIDE 0.9 % IV BOLUS (SEPSIS)
1000.0000 mL | Freq: Once | INTRAVENOUS | Status: AC
Start: 2016-01-21 — End: 2016-01-21
  Administered 2016-01-21: 1000 mL via INTRAVENOUS

## 2016-01-21 MED ORDER — SODIUM BICARBONATE 8.4 % IV SOLN
50.0000 meq | Freq: Once | INTRAVENOUS | Status: AC
  Administered 2016-01-21: 50 meq via INTRAVENOUS

## 2016-01-21 MED ORDER — PRO-STAT SUGAR FREE PO LIQD
30.0000 mL | Freq: Two times a day (BID) | ORAL | Status: DC
  Administered 2016-01-21: 30 mL
  Filled 2016-01-21: qty 30

## 2016-01-21 MED ORDER — HEPARIN BOLUS VIA INFUSION
3000.0000 [IU] | Freq: Once | INTRAVENOUS | Status: AC
  Administered 2016-01-21: 3000 [IU] via INTRAVENOUS
  Filled 2016-01-21: qty 3000

## 2016-01-21 MED ORDER — HEPARIN (PORCINE) IN NACL 100-0.45 UNIT/ML-% IJ SOLN
1150.0000 [IU]/h | INTRAMUSCULAR | Status: DC
  Administered 2016-01-21: 1150 [IU]/h via INTRAVENOUS
  Filled 2016-01-21: qty 250

## 2016-01-21 MED ORDER — PHENYLEPHRINE HCL 10 MG/ML IJ SOLN
30.0000 ug/min | INTRAMUSCULAR | Status: DC
  Administered 2016-01-21: 10 ug/min via INTRAVENOUS
  Filled 2016-01-21: qty 1

## 2016-01-21 MED ORDER — PHENYLEPHRINE HCL 10 MG/ML IJ SOLN
30.0000 ug/min | INTRAVENOUS | Status: DC
  Administered 2016-01-21: 10 ug/min via INTRAVENOUS
  Filled 2016-01-21 (×3): qty 4

## 2016-01-21 MED ORDER — SODIUM CHLORIDE 0.9 % IV BOLUS (SEPSIS)
500.0000 mL | Freq: Once | INTRAVENOUS | Status: AC
  Administered 2016-01-21: 500 mL via INTRAVENOUS

## 2016-01-21 MED ORDER — PANTOPRAZOLE SODIUM 40 MG PO PACK: 40.0000 mg | PACK | Freq: Every day | ORAL | Status: DC

## 2016-01-21 MED ORDER — PANTOPRAZOLE SODIUM 40 MG PO PACK
40.0000 mg | PACK | Freq: Every day | ORAL | Status: DC
  Administered 2016-01-21: 40 mg via ORAL
  Filled 2016-01-21: qty 20

## 2016-01-21 MED ORDER — SODIUM CHLORIDE 0.9 % IV SOLN
INTRAVENOUS | Status: DC
  Administered 2016-01-21: 04:00:00 via INTRAVENOUS

## 2016-01-22 LAB — GLUCOSE, CAPILLARY: GLUCOSE-CAPILLARY: 121 mg/dL — AB (ref 65–99)

## 2016-01-22 NOTE — Progress Notes (Addendum)
225 cc fentanyl wasted in sink, witnessed by Arrie AranMacKayla Posie Lillibridge, RN.   Signing that I witnessed this waste. Horton ChinMacKayla A Emonnie Cannady, RN

## 2016-01-23 LAB — CULTURE, BLOOD (ROUTINE X 2)
CULTURE: NO GROWTH
CULTURE: NO GROWTH

## 2016-01-26 ENCOUNTER — Telehealth: Payer: Self-pay | Admitting: Cardiovascular Disease

## 2016-01-26 ENCOUNTER — Encounter (HOSPITAL_COMMUNITY): Payer: Self-pay

## 2016-01-26 ENCOUNTER — Encounter (HOSPITAL_COMMUNITY): Payer: Medicare Other

## 2016-01-26 ENCOUNTER — Telehealth: Payer: Self-pay

## 2016-01-26 NOTE — Telephone Encounter (Signed)
[facility] Will See if Dr.Nahser will sign and complete.

## 2016-01-26 NOTE — Telephone Encounter (Signed)
New message  Pt call requesting to speak with RN about getting pt cause of death. Please call back to discuss.

## 2016-01-26 NOTE — Telephone Encounter (Signed)
Spoke with Wife she is aware I cannot release cause of Death to her. She was ok with this.

## 2016-01-26 NOTE — Progress Notes (Signed)
Received certificate of death on behalf of patient, however, do not see where this is Dr. Prescott GumBensimhon's patient. [facility] Will place certificate in death certificate folder in CHF clinic.  Ave FilterBradley, Megan Genevea, RN

## 2016-01-26 NOTE — Telephone Encounter (Signed)
[facility] she is aware d/c ready for pick up.

## 2016-02-02 ENCOUNTER — Encounter (HOSPITAL_COMMUNITY): Payer: Medicare Other

## 2016-02-11 NOTE — Discharge Summary (Signed)
01/22/2016  2:26 AM  Final diagnosis: 1. Coronary artery disease, native vessel with acute myocardial infarction 2. NSTEMI (non-ST elevated myocardial infarction) (HCC) 3. Respiratory failure (hypoxic) requiring intubation  4. Acute on chronic systolic heart failure 5. Acute pulmonary edema 6. Mitral regurgitation 7. Cardiogenic shock  8. Acute on chronic stage III-4 renal insufficiency 9. Carotid artery disease, right carotid occlusion, history of left internal carotid endarterectomy 10. Normocytic normochromic anemia, probably related to renal disease 11. Hypertension  Elderly gentleman with hypertension, advanced chronic kidney disease, previous congestive heart failure with depressed systolic function presented with unstable angina pectoris (2 months of progressive stable angina on exertion, angina at rest on day of presentation) with abnormal cardiac enzymes and ST segment depression on electrocardiogram, consistent with non-ST segment elevation myocardial infarction. Symptoms were controlled with intravenous nitroglycerin and pain medication. Intravenous heparin and aspirin was administered. Clopidogrel was continued.  Due to acute on chronic renal insufficiency, the decision was made to delay coronary angiography for 24 hours while he received intravenous fluids.  The following morning the patient developed heart failure/pulmonary edema and required BiPAP support. The echocardiogram showed marked worsening of left ventricular systolic function to an ejection fraction of 30% and new regional wall motion abnormalities. There was also evidence of moderate aortic stenosis and probably severe mitral regurgitation. Diuretics were administered. The patient required endotracheal intubation and the pulmonary-critical care service helped with the patient's management..  The patient underwent coronary angiography. He had 40-50% stenosis in the ostium of the left main coronary artery, 99% stenosis in  the midcircumflex coronary artery and received a drug-eluting stent for that lesion. There was a residual small 50% left circumflex artery stenosis.  Overnight he converted to atrial fibrillation with controlled ventricular rate. Following the positive pressure ventilation diuretics his chest x-ray showed some improvement, but he still required relatively high concentration oxygen via ventilator. He was hypotensive, renal parameters worsened, but he maintained urine output. Nephrology consultation was obtained.  Late in the evening of December 7 the patient developed marked hypotension and bradycardia with poor response to intravenous pressors and intravenous bicarbonate. The family was present at the bedside during his deterioration in the patient's DO NOT RESUSCITATE status was respected. He passed away during the night, 01/22/2016  2:26 AM.

## 2016-02-15 NOTE — Progress Notes (Signed)
eLink Physician-Brief Progress Note Patient Name: Anthony Werner DOB: 06/22/1924 MRN: [id number]   Date of Service  01/15/2016  HPI/Events of Note  Hypotensive  Intake/Output Summary (Last 24 hours) at 01/28/2016 2020 Last data filed at 01/30/2016 1900  Gross per 24 hour  Intake          1791.21 ml  Output              620 ml  Net          1171.21 ml    CVP:  [8 mmHg-15 mmHg] 8 mmHg  (in setting of autopeep)  eICU Interventions  NS x one liter IV     Intervention Category Major Interventions: Hypotension - evaluation and management  Sandrea HughsMichael Jette Lewan 02/02/2016, 8:20 PM

## 2016-02-15 NOTE — Progress Notes (Signed)
Time of death: 2215  NP Joneen RoachPaul Hoffman at bedside with RN and pt's family.

## 2016-02-15 NOTE — Progress Notes (Signed)
Pharmacist Heart Failure Core Measure Documentation  Assessment: Anthony CanavanHarold J Werner has an EF documented as 30% on 01/19/16 by echo.  Rationale: Heart failure patients with left ventricular systolic dysfunction (LVSD) and an EF < 40% should be prescribed an angiotensin converting enzyme inhibitor (ACEI) or angiotensin receptor blocker (ARB) at discharge unless a contraindication is documented in the medical record.  This patient is not currently on an ACEI or ARB for HF.  This note is being placed in the record in order to provide documentation that a contraindication to the use of these agents is present for this encounter.  ACE Inhibitor or Angiotensin Receptor Blocker is contraindicated (specify all that apply)  []   ACEI allergy AND ARB allergy []   Angioedema []   Moderate or severe aortic stenosis []   Hyperkalemia []   Hypotension []   Renal artery stenosis [x]   Worsening renal function, preexisting renal disease or dysfunction   Len ChildsBell, [name] 01/18/2016 10:54 AM

## 2016-02-15 NOTE — Consult Note (Signed)
Reason for Consult: AKI/CKD Referring Physician:  Wilber Oliphant, MD  Anthony Werner is an 83 y.o. male.  HPI: Pt is an 83yo AAM with PMH significant for HTN, CHF (EF 30%), hyperlipidemia, and CKD stage 3-4 who presented to [facility] ED with progressive angina and found to have an NSTEMI.  He was started on IVF's for renoprotection but developed pulmonary edema and was intubated 02/10/2016.  He was subsequently taken to the cath lab and underwent urgent revascularization of his LCx with DES which was complicated by A fib with RVR, hypotension/cardiogenic shock.  We were asked to see Mr. Anthony Werner after he developed oliguric AKI/CKD in setting of IV contrast and cardiogenic shock.  The trend in Scr is seen below.   Trend in Creatinine: Creatinine, Ser  Date/Time Value Ref Range Status  02-13-2016 05:00 AM 2.81 (H) 0.61 - 1.24 mg/dL Final  16/11/9602 54:09 AM 2.31 (H) 0.61 - 1.24 mg/dL Final  81/19/1478 29:56 AM 2.26 (H) 0.61 - 1.24 mg/dL Final  21/30/8657 84:69 AM 2.18 (H) 0.61 - 1.24 mg/dL Final  62/95/2841 32:44 PM 2.24 (H) 0.61 - 1.24 mg/dL Final  02/16/7251 66:44 AM 1.66 (H) 0.40 - 1.50 mg/dL Final  03/47/4259 56:38 AM 1.64 (H) 0.40 - 1.50 mg/dL Final  75/64/3329 51:88 AM 1.5 0.4 - 1.5 mg/dL Final  41/66/0630 16:01 AM 1.6 (H) 0.4 - 1.5 mg/dL Final  09/32/3557 32:20 AM 1.7 (H) 0.4 - 1.5 mg/dL Final  25/42/7062 37:62 AM 2.1 (H) 0.4 - 1.5 mg/dL Final  83/15/1761 60:73 PM 1.5 0.4 - 1.5 mg/dL Final  71/07/2692 85:46 AM 1.6 (H) 0.4 - 1.5 mg/dL Final  27/04/5007 38:18 AM 1.6 (H) 0.4 - 1.5 mg/dL Final  29/93/7169 67:89 AM 1.32 0.4 - 1.5 mg/dL Final  38/11/1749 02:58 AM 1.37 0.4 - 1.5 mg/dL Final  52/77/8242 35:36 AM 1.3 0.4 - 1.5 mg/dL Final  14/43/1540 08:67 PM 1.1 0.4 - 1.5 mg/dL Final  61/95/0932 67:12 PM 1.31 (0.40-1.50 mg/dL Final  45/80/9983 38:25 PM 1.29 (0.40-1.50 mg/dL Final  05/39/7673 41:93 PM 1.27 mg/dL   79/03/4095 35:32 AM 9.92 0.4 - 1.5 mg/dL Final  42/68/3419 62:22 AM 1.44 0.4 - 1.5 mg/dL  Final  97/98/9211 94:17 AM 1.58 (H) 0.4 - 1.5 mg/dL Final  40/81/4481 85:63 AM 1.67 (H) 0.4 - 1.5 mg/dL Final  14/97/0263 78:58 AM 1.56 (H) 0.4 - 1.5 mg/dL Final  85/03/7739 28:78 AM 1.52 (H) 0.4 - 1.5 mg/dL Final  67/67/2094 70:96 AM 1.71 (H) 0.4 - 1.5 mg/dL Final  28/36/6294 76:54 PM 1.60 (H) 0.4 - 1.5 mg/dL Final  65/04/5463 68:12 AM 1.85 (H) 0.4 - 1.5 mg/dL Final  75/17/0017 49:44 PM 1.82 (H) 0.4 - 1.5 mg/dL Final  96/75/9163 84:66 AM 2.06 (H) 0.4 - 1.5 mg/dL Final  59/93/5701 77:93 AM 2.4 (H) 0.4 - 1.5 mg/dL Final  90/30/0923 30:07 PM 1.5  Final    PMH:   Past Medical History:  Diagnosis Date  . Anemia of chronic disease 04/2009   hgb 8...transfusion 04/2009  . BPH (benign prostatic hyperplasia)   . Carotid artery disease (HCC)    Total right carotid, 60-79% LICA Dr. Edilia Bo, July, 2011 / left carotid endarterectomy January, 2012     . Chest pain    Nuclear, April, 2011, no scar or ischemia  . CHF (congestive heart failure) (HCC) 04/07/09   CHF while in hospital February, 2011, EF 45%  . CKD (chronic kidney disease), stage II   . Community acquired pneumonia   . Ejection fraction  45% by history  . Esophageal stricture    Dilatation and [facility], / dilatation later by Dr. Arlyce DiceKaplan  . GERD (gastroesophageal reflux disease)   . Gout   . Hyperlipidemia   . Hypertension   . Impaired fasting glucose   . NSTEMI (non-ST elevated myocardial infarction) (HCC) 01/19/2016    PSH:   Past Surgical History:  Procedure Laterality Date  . CARDIAC CATHETERIZATION N/A 01/19/2016   Procedure: Left Heart Cath and Coronary Angiography;  Surgeon: Lyn RecordsHenry W Smith, MD;  [facility];  Service: Cardiovascular;  Laterality: N/A;  . CARDIAC CATHETERIZATION Right 01/28/2016   Procedure: Right Heart Cath;  Surgeon: Lyn RecordsHenry W Smith, MD;  [facility];  Service: Cardiovascular;  Laterality: Right;  . CARDIAC CATHETERIZATION N/A 02/13/2016   Procedure: Coronary Stent Intervention;   Surgeon: Lyn RecordsHenry W Smith, MD;  [facility];  Service: Cardiovascular;  Laterality: N/A;  . CAROTID ENDARTERECTOMY  LEFT   03/10/2010  . CATARACT EXTRACTION  2008   Right  and   Left same year  . ESOPHAGEAL DILATION     x3  . EYE SURGERY Right 09-11-13   Vetroretinal " Laser "  . HERNIA REPAIR  2007  . Iron Injections  Sept. 2013 , Aug. 2014   Every 3 weeks  . TONSILLECTOMY      Allergies:  Allergies  Allergen Reactions  . Atenolol   . Diltiazem   . Hydrochlorothiazide   . Labetalol   . Lovastatin   . Niacin   . Simvastatin   . Terazosin   . Iron     Just had constipation    Medications:   Prior to Admission medications   Medication Sig Start Date End Date Taking? Authorizing Provider  amLODipine (NORVASC) 10 MG tablet Take 10 mg by mouth daily.   Yes Historical Provider, MD  aspirin 81 MG tablet Take 81 mg by mouth daily.     Yes Historical Provider, MD  b complex vitamins capsule Take 1 capsule by mouth daily. Plus  C   Yes Historical Provider, MD  carvedilol (COREG) 12.5 MG tablet TAKE 1 TABLET BY MOUTH TWICE A DAY WITH A MEAL 11/16/15  Yes Vesta MixerPhilip J Nahser, MD  cholecalciferol (VITAMIN D) 1000 UNITS tablet Take 1,000 Units by mouth daily.   Yes Historical Provider, MD  clopidogrel (PLAVIX) 75 MG tablet TAKE 1 TABLET (75 MG TOTAL) BY MOUTH DAILY. 08/10/15  Yes Vesta MixerPhilip J Nahser, MD  furosemide (LASIX) 40 MG tablet TAKE 1 TABLET (40 MG TOTAL) BY MOUTH DAILY, MAY TAKE 2ND DOSE AT LUNCHTIME IF FEET ARE SWOLLEN 09/07/15  Yes Karie Schwalbeichard I Letvak, MD  isosorbide mononitrate (IMDUR) 30 MG 24 hr tablet TAKE 1 TABLET BY MOUTH EVERY DAY 08/31/15  Yes Vesta MixerPhilip J Nahser, MD  lisinopril (PRINIVIL,ZESTRIL) 20 MG tablet TAKE 1 TABLET BY MOUTH EVERY DAY 08/04/15  Yes Vesta MixerPhilip J Nahser, MD  pantoprazole (PROTONIX) 40 MG tablet Take 1 tablet (40 mg total) by mouth daily. 08/27/15  Yes Karie Schwalbeichard I Letvak, MD  pravastatin (PRAVACHOL) 40 MG tablet TAKE 1 TABLET (40 MG TOTAL) BY MOUTH DAILY.  09/07/15  Yes Karie Schwalbeichard I Letvak, MD  prednisoLONE acetate (PRED FORTE) 1 % ophthalmic suspension Place 1 drop into the right eye daily.  10/05/11  Yes Historical Provider, MD    Inpatient medications: . aspirin  81 mg Oral Daily  . chlorhexidine  15 mL Mouth Rinse BID  . Chlorhexidine Gluconate Cloth  6 each Topical  Daily  . clopidogrel  75 mg Oral Daily  . feeding supplement (PRO-STAT SUGAR FREE 64)  30 mL Per Tube BID  . feeding supplement (VITAL HIGH PROTEIN)  1,000 mL Per Tube Q24H  . mouth rinse  15 mL Mouth Rinse q12n4p  . mupirocin ointment  1 application Nasal BID  . pantoprazole sodium  40 mg Oral Daily  . pravastatin  40 mg Oral q1800  . sodium chloride flush  3 mL Intravenous Q12H    Discontinued Meds:   Medications Discontinued During This Encounter  Medication Reason  . 0.9 %  sodium chloride infusion   . heparin ADULT infusion 100 units/mL (25000 units/259mL sodium chloride 0.45%)   . iopamidol (ISOVUE-370) 76 % injection Patient Discharge  . clopidogrel (PLAVIX) tablet Patient Discharge  . tirofiban (AGGRASTAT) infusion 50 mcg/mL 100 mL Patient Discharge  . tirofiban (AGGRASTAT) bolus via infusion Patient Discharge  . nitroGLYCERIN 1 mg/10 ml (100 mcg/ml) - IR/CATH LAB Patient Discharge  . fentaNYL (SUBLIMAZE) injection Patient Discharge  . midazolam (VERSED) injection Patient Discharge  . bivalirudin (ANGIOMAX) 250 mg in sodium chloride 0.9 % 50 mL (5 mg/mL) infusion Patient Discharge  . heparin injection Patient Discharge  . lidocaine (PF) (XYLOCAINE) 1 % injection Patient Discharge  . heparin infusion 2 units/mL in 0.9 % sodium chloride Patient Discharge  . sodium chloride flush (NS) 0.9 % injection 3 mL Patient Transfer  . sodium chloride flush (NS) 0.9 % injection 3 mL Patient Transfer  . 0.9 %  sodium chloride infusion Patient Transfer  . 0.9 %  sodium chloride infusion Patient Transfer  . clopidogrel (PLAVIX) tablet 75 mg Duplicate  . heparin injection 5,000  Units   . nitroGLYCERIN 50 mg in dextrose 5 % 250 mL (0.2 mg/mL) infusion   . heparin injection 5,000 Units   . metoprolol tartrate (LOPRESSOR) tablet 12.5 mg   . pantoprazole (PROTONIX) EC tablet 40 mg Duplicate  . pantoprazole sodium (PROTONIX) 40 mg/20 mL oral suspension 40 mg   . phenylephrine (NEO-SYNEPHRINE) 10 mg in dextrose 5 % 250 mL (0.04 mg/mL) infusion     Social History:  reports that he quit smoking about 48 years ago. His smoking use included Cigarettes. He has never used smokeless tobacco. He reports that he does not drink alcohol or use drugs.  Family History:   Family History  Problem Relation Age of Onset  . Cancer Father     Prostate  . Hypertension Father   . Heart disease Father     Heart Disease before age 26  . Heart disease Brother     x2  . Hypertension Brother   . Heart attack Brother   . Hypertension Mother   . Hypertension Sister   . Peripheral vascular disease Sister   . Hypertension Daughter     Review of systems not obtained due to patient factors. Weight change: -3.811 kg (-8 lb 6.4 oz)  Intake/Output Summary (Last 24 hours) at 02/13/2016 1101 Last data filed at 01/16/2016 1036  Gross per 24 hour  Intake          1715.45 ml  Output              770 ml  Net           945.45 ml   BP (!) 81/49   Pulse 73   Temp 98.7 F (37.1 C) (Oral)   Resp 20   Ht 5\' 7"  (1.702 m)   Wt 60.6 kg (133 lb  9.6 oz)   SpO2 100%   BMI 20.92 kg/m  Vitals:   02-02-2016 0845 02-02-2016 0900 02-02-2016 1000 02-02-2016 1010  BP: (!) 92/57 (!) 91/53 (!) 81/49 (!) 81/49  Pulse: 74 73 72 73  Resp: 10 10 12 20   Temp:      TempSrc:      SpO2: 100% 100% 100% 100%  Weight:      Height:         General appearance: cachectic, fatigued and intubated and sedat ed Head: Normocephalic, without obvious abnormality, atraumatic Resp: rhonchi bilaterally Cardio: faint heart sounds, no rub appreciated GI: soft, non-tender; bowel sounds normal; no masses,  no  organomegaly Extremities: extremities normal, atraumatic, no cyanosis or edema  Labs: Basic Metabolic Panel:  Recent Labs Lab 02/11/2016 2224 01/19/16 0419 01/19/16 1125 02/11/2016 0401 02-02-2016 0500  NA 136 136 137 136 136  K 4.6 4.8 4.5 4.2 4.7  CL 102 103 106 103 104  CO2 22 19* 22 20* 19*  GLUCOSE 172* 111* 95 140* 113*  BUN 61* 65* 65* 67* 80*  CREATININE 2.24* 2.18* 2.26* 2.31* 2.81*  CALCIUM 9.2 8.8* 8.2* 8.5* 7.9*   Liver Function Tests: No results for input(s): AST, ALT, ALKPHOS, BILITOT, PROT, ALBUMIN in the last 168 hours. No results for input(s): LIPASE, AMYLASE in the last 168 hours. No results for input(s): AMMONIA in the last 168 hours. CBC:  Recent Labs Lab 01/19/16 0419 01/23/2016 0401 01/17/2016 2200 02-02-2016 0500  WBC 7.5 8.9 12.0* 9.7  HGB 9.9* 9.8* 9.9* 9.1*  HCT 30.6* 30.0* 30.5* 28.4*  MCV 93.6 92.6 93.8 95.3  PLT 194 214 222 203   PT/INR: @LABRCNTIP (inr:5) Cardiac Enzymes: ) Recent Labs Lab 02/13/2016 2233 01/19/16 0419 01/22/2016 0401  TROPONINI 1.25* 1.01* 1.47*   CBG:  Recent Labs Lab 02/11/2016 1924 01/16/2016 2357 02-02-2016 0354 02-02-2016 0837 02-02-2016 0843  GLUCAP 106* 107* 86 32* 95    Iron Studies: No results for input(s): IRON, TIBC, TRANSFERRIN, FERRITIN in the last 168 hours.  Xrays/Other Studies: Dg Chest Port 1 View  Result Date: 01/17/2016 CLINICAL DATA:  Respiratory failure EXAM: PORTABLE CHEST 1 VIEW COMPARISON:  Yesterday FINDINGS: Endotracheal tube and NG tube are stable. Extensive diffuse bilateral airspace disease has improved. Bilateral pleural effusions are suspected. Normal heart size. No pneumothorax. IMPRESSION: Improving diffuse bilateral airspace disease. Electronically Signed   By: Jolaine ClickArthur  Hoss M.D.   On: 2015-10-08 08:29   Dg Chest Port 1 View  Result Date: 02/12/2016 CLINICAL DATA:  Intubated.  Respiratory support. EXAM: PORTABLE CHEST 1 VIEW COMPARISON:  01/19/2016 FINDINGS: Endotracheal tube has its tip 3 cm  above the carina. Nasogastric tube enters the abdomen. There is worsening of diffuse bilateral airspace density most consistent with worsened diffuse bronchopneumonia. The may be a small amount of accumulating pleural fluid. Aortic atherosclerosis again demonstrated. IMPRESSION: Endotracheal tube and nasogastric tube well positioned. Continued radiographic worsening of diffuse airspace filling most consistent with diffuse pneumonia. Electronically Signed   By: Paulina FusiMark  Shogry M.D.   On: 02/07/2016 10:27   Dg Chest Port 1 View  Result Date: 01/19/2016 CLINICAL DATA:  Dyspnea EXAM: PORTABLE CHEST 1 VIEW COMPARISON:  01/25/2016 FINDINGS: Worsened vascular and interstitial congestive changes. Worsened central and basilar airspace opacities bilaterally. Probable pleural effusions, mildly enlarged from 02/02/2016. Unchanged cardiomegaly. IMPRESSION: The findings likely represent worsening congestive heart failure with interstitial and alveolar edema. Electronically Signed   By: Ellery Plunkaniel R Mitchell M.D.   On: 01/19/2016 23:24     Assessment/Plan:  1.  AKI/CKD- in setting of IV Contrast and cardiogenic shock now requiring pressors.  Now oliguric and hypotensive.  Would hold off on diuretics and focus on maximizing his hemodynamic status.  No family present to discuss possibility of initiating CVVHD.  No urgent indication to start CVVHD, however will likely need to if there is no improvement in the next 24 hours and family wishes to be aggressive.  Continue to follow for now and hold off on diuretics due to hypotension. 2. Cardiogenic shock- per Cardiology and PCCM 3. VDRF- per PCCM 4. NSTEMI- s/p PCI with DES of LCx  5. Valvular heart disease (moderate Aortic stenosis and mod to severe MR)- per Cardiology 6. A fib rate controlled with low dose metoprolol 7. Disposition- poor overall prognosis and would recommend palliative care to help set goals/limits of care.   Jomarie Longs A Jesslynn Kruck 02/11/2016, 11:01 AM

## 2016-02-15 NOTE — Progress Notes (Signed)
   01/31/2016 2250  Clinical Encounter Type  Visited With Patient and family together  Visit Type Death  Referral From Nurse  Spiritual Encounters  Spiritual Needs Prayer;Grief support  Stress Factors  Patient Stress Factors Not reviewed  Family Stress Factors Family relationships;Loss  Introduction to family. Offered prayer of hope and comfort.

## 2016-02-15 NOTE — Procedures (Signed)
Central Venous Catheter Insertion Procedure Note Anthony Werner [id number] 11/18/1924  Procedure: Insertion of Central Venous Catheter Indications: Assessment of intravascular volume, Drug and/or fluid administration and Frequent blood sampling  Procedure Details Consent: Risks of procedure as well as the alternatives and risks of each were explained to the (patient/caregiver).  Consent for procedure obtained. Time Out: Verified patient identification, verified procedure, site/side was marked, verified correct patient position, special equipment/implants available, medications/allergies/relevent history reviewed, required imaging and test results available.  Performed  Maximum sterile technique was used including antiseptics, cap, gloves, gown, hand hygiene, mask and sheet. Skin prep: Chlorhexidine; local anesthetic administered A antimicrobial bonded/coated triple lumen catheter was placed in the right internal jugular vein using the Seldinger technique.  Evaluation Blood flow good Complications: No apparent complications Patient did tolerate procedure well. Chest X-ray ordered to verify placement.  CXR: pending.  Anthony Werner,Anthony Anthony Werner J. 02/06/2016, 3:06 PM  US Dc fem  Anthony Anthony Werner J. Anthony AliasFeinstein, MD, FACP Pgr: [phone] [facility]

## 2016-02-15 NOTE — Significant Event (Signed)
Hypoglycemic Event  CBG: 43, from capillary sample from left hand fingertip  Treatment: Redraw using venous sample from central line   Symptoms: asymptomatic   Follow-up CBG: Time: 0843 CBG Result: 95  Possible Reasons for Event: Not accurate, poor perfusion. Finger tips cold  Comments/MD notified: N/A   Anthony Werner

## 2016-02-15 NOTE — Progress Notes (Signed)
[facility] PCCM PROGRESS NOTE  83 year old male with multiple medical problems admitted 12/4 with AoC CHF and chest pain. Underwent cath with DES to LCX. Then worsening pulmonary edema requiring vent. progressed to cardiogenic shock started pressors 12/7. Now I am called to evaluate him for worsening shock on phenylephrine. Upon my arrival he is hypotensive with SBP in 60s and has started to brady down to 30s. DNR noted. 1/2 amp epi pushed and 1 amp bicarb to try to salvage from impending cardiac arrest. He responded well to this initially. However, he quickly developed worsening shock refractory to epinephrine infusion and bicarb. Family was at bedside and as we were addressing goal of care he lost pulse. DNR. I informed family of his death and answered their questions.   Joneen RoachPaul Senay Sistrunk, AGACNP-BC [facility] Pager [phone] or [phone]  02/09/2016 10:18 PM

## 2016-02-15 NOTE — Progress Notes (Addendum)
PULMONARY / CRITICAL CARE MEDICINE   Name: Jac CanavanHarold J Baswell MRN: [id number] DOB: 03/02/1924    ADMISSION DATE:  01/30/2016 CONSULTATION DATE: 12/6  REFERRING MD:  Cards  CHIEF COMPLAINT:  SOB  HISTORY OF PRESENT ILLNESS:   MR. Anthony Werner is a 83 yo AAM with known CHF 30%, HTN, CKD, who was admitted 12/5 for 2 months of chest pain that worsened 24 hours prior to admit. Trop 1.25 on admit and increased 1.47 12/6. During early am of 12/6 he had increased sob(despite neg I/o from lasix)and failed bipap attempt. He was moved by RRT to 2304 and requires urgent intubation per Dr. Tyson AliasFeinstein at 1000. Cards has asked PCCM to assist in his care. He is scheduled for r/l heart cath 12/6.   SUBJECTIVE:  ett Cath, stent  VITAL SIGNS: BP (!) 91/53   Pulse 73   Temp 98.7 F (37.1 C) (Oral)   Resp 10   Ht 5\' 7"  (1.702 m)   Wt 60.6 kg (133 lb 9.6 oz)   SpO2 100%   BMI 20.92 kg/m   HEMODYNAMICS: CVP:  [10 mmHg-15 mmHg] 15 mmHg  VENTILATOR SETTINGS: Vent Mode: PRVC FiO2 (%):  [70 %-100 %] 70 % Set Rate:  [12 bmp] 12 bmp Vt Set:  [530 mL] 530 mL PEEP:  [8 cmH20] 8 cmH20 Plateau Pressure:  [9 cmH20-26 cmH20] 21 cmH20  INTAKE / OUTPUT: I/O last 3 completed shifts: In: 2128.5 [P.O.:60; I.V.:1738.5; NG/GT:330] Out: 1685 [Urine:1315; Emesis/NG output:370]  PHYSICAL EXAMINATION: General:  Sedated on vent Neuro:  rass -1, perr, moves all ext HEENT: + jvd, no lan Cardiovascular:  S1 s2 RRR Lungs:  Crackles bilateral, ild exp wheezing Abdomen:  Soft +bs, no r/g Musculoskeletal: intact Skin:Cool, toes with changes PVD  LABS:  BMET  Recent Labs Lab 01/19/16 1125 01/28/2016 0401 01/03/2016 0500  NA 137 136 136  K 4.5 4.2 4.7  CL 106 103 104  CO2 22 20* 19*  BUN 65* 67* 80*  CREATININE 2.26* 2.31* 2.81*  GLUCOSE 95 140* 113*    Electrolytes  Recent Labs Lab 01/19/16 1125 02/07/2016 0401 01/03/2016 0500  CALCIUM 8.2* 8.5* 7.9*    CBC  Recent Labs Lab 01/16/2016 0401 02/07/2016 2200  01/03/2016 0500  WBC 8.9 12.0* 9.7  HGB 9.8* 9.9* 9.1*  HCT 30.0* 30.5* 28.4*  PLT 214 222 203    Coag's  Recent Labs Lab 01/19/16 0419  INR 1.43    Sepsis Markers  Recent Labs Lab 01/25/2016 2338 01/19/16 0221  LATICACIDVEN 2.03* 2.02*    ABG  Recent Labs Lab 01/23/2016 0520 02/01/2016 1105 02/05/2016 1202  PHART 7.441 7.366 7.368  PCO2ART 27.3* 32.7 34.1  PO2ART 51.2* 102 84.0    Liver Enzymes No results for input(s): AST, ALT, ALKPHOS, BILITOT, ALBUMIN in the last 168 hours.  Cardiac Enzymes  Recent Labs Lab 01/24/2016 2233 01/19/16 0419 02/06/2016 0401  TROPONINI 1.25* 1.01* 1.47*    Glucose  Recent Labs Lab 01/27/2016 1614 02/12/2016 1924 02/14/2016 2357 01/03/2016 0354 01/03/2016 0837 01/03/2016 0843  GLUCAP 73 106* 107* 86 32* 95    Imaging Dg Chest Port 1 View  Result Date: 01/15/2016 CLINICAL DATA:  Respiratory failure EXAM: PORTABLE CHEST 1 VIEW COMPARISON:  Yesterday FINDINGS: Endotracheal tube and NG tube are stable. Extensive diffuse bilateral airspace disease has improved. Bilateral pleural effusions are suspected. Normal heart size. No pneumothorax. IMPRESSION: Improving diffuse bilateral airspace disease. Electronically Signed   By: Jolaine ClickArthur  Hoss M.D.   On: 08-22-15 08:29  Dg Chest Port 1 View  Result Date: 01/24/2016 CLINICAL DATA:  Intubated.  Respiratory support. EXAM: PORTABLE CHEST 1 VIEW COMPARISON:  01/19/2016 FINDINGS: Endotracheal tube has its tip 3 cm above the carina. Nasogastric tube enters the abdomen. There is worsening of diffuse bilateral airspace density most consistent with worsened diffuse bronchopneumonia. The may be a small amount of accumulating pleural fluid. Aortic atherosclerosis again demonstrated. IMPRESSION: Endotracheal tube and nasogastric tube well positioned. Continued radiographic worsening of diffuse airspace filling most consistent with diffuse pneumonia. Electronically Signed   By: Paulina FusiMark  Shogry M.D.   On: 01/15/2016  10:27     STUDIES:  12/6 Cath>>stent 99% circ mid  CULTURES:   ANTIBIOTICS:   SIGNIFICANT EVENTS: 12/6 intubated, cath, stent 99% circ  LINES/TUBES: 12/6 ett>> 12/7 rt fem ( cath lab)>>>  DISCUSSION: MR. Anthony Werner is a 83 yo AAM with known CHF 30%, HTN, CKD, who was admitted 12/5 for 2 months of chest pain that worsened 24 hours prior to admit. Trop 1.25 on admit and increased 1.47 12/6. During early am of 12/6 he had increased sob(despite neg I/o from lasix)and failed bipap attempt. He was moved by RRT to 2304 and requires urgent intubation per Dr. Tyson AliasFeinstein at 1000. Cards has asked PCCM to assist in his care. He is scheduled for r/l heart cath 12/6. stent  ASSESSMENT / PLAN:  PULMONARY A: Acute resp distress 12/6 with flash pulmonary edema in setting NSTEMI. P:   pcxr some what improved Will need neg balance, follow crt closely To goal 50% then peep down  CARDIOVASCULAR A:  NSTEMI with trop 1.25 on admit and rising CHF last ef 30% Hypotension post intubation-resolving NSTEMI, 99% cric, stent PVD P:  cvp fem dc Place neck line and dc fem line Cortisol Neo likley needed to map 60 Get real cvp  RENAL Lab Results  Component Value Date   CREATININE 2.81 (H) 05/01/15   CREATININE 2.31 (H) 02/14/2016   CREATININE 2.26 (H) 01/19/2016    A:   Chronic renal failure Diuresis 12/5 S/p cath contrast Pos balance P:   Monitor creatine Avoid nephrotoxins Will need lasix if not able to reduce O2 / peep needs Foley NEEDED  GASTROINTESTINAL A:   Hx of esophageal stricture P:   PPI OGT as tolerated feed  HEMATOLOGIC  Recent Labs  01/25/2016 2200 2015-09-22 0500  HGB 9.9* 9.1*    A:   Chronic anemia P:  Transfuse per protocol  Anticoagulation systemic per cards  INFECTIOUS A:   No overt infectious process P:   Monitor  ENDOCRINE A:   Hyperglycemia P:   SSI  NEUROLOGIC A:   Awake and MAEx4 prior to intubation Vascular dementia P:   RASS  goal: -1 Sedation as needed- fent drip  FAMILY  - Updates: I updating wife and daughter, will discuss code status  - Inter-disciplinary family meet or Palliative Care meeting due by:  day 7  CCT= 35 min   Mcarthur Rossettianiel J. Tyson AliasFeinstein, MD, FACP Pgr: [phone] [facility] 01/30/2016 10:02 AM   I have had extensive discussions with family wife. We discussed patients current circumstances and organ failures. We also discussed patient's prior wishes under circumstances such as this. Family has decided to NOT perform resuscitation if arrest but to continue current medical support for now.

## 2016-02-15 NOTE — Progress Notes (Signed)
Initial Nutrition Assessment  DOCUMENTATION CODES:   Severe malnutrition in context of chronic illness  INTERVENTION:   Vital High Protein start at 30 ml/hr and increase by 10 ml every 12 hours to goal rate of 60 ml/hr (1440 ml/day) Provides: 1440 kcal, 126 grams protein, and 1203 ml H2O.   Monitor magnesium, potassium, and phosphorus daily for at least 3 days, MD to replete as needed, as pt is at risk for refeeding syndrome given severe malnutrition.   NUTRITION DIAGNOSIS:   Malnutrition (Severe) related to chronic illness (CHF) as evidenced by severe depletion of body fat, severe depletion of muscle mass.  GOAL:   Patient will meet greater than or equal to 90% of their needs  MONITOR:   TF tolerance, Vent status, Labs  REASON FOR ASSESSMENT:   Consult Enteral/tube feeding initiation and management  ASSESSMENT:   Pt with hx of CHF, HTN, CKD, esophageal stricture, GERD admitted with hx of 2 months of chest pain which had worsened. Early 12/6 pt intubated for for pulmonary edema   No family present Per nephrology may need CVVHD.   MV: 9.1 L/min Temp (24hrs), Avg:98.7 F (37.1 C), Min:97.7 F (36.5 C), Max:99.4 F (37.4 C)  OG tube (well positioned per xray) Pressor started, MAP remains > 60 Per chart review, weight stable PO4 6.9, magnesium 2.5 Nutrition-Focused physical exam completed. Findings are severe fat depletion, severe muscle depletion, and mild edema.    Diet Order:    NPO  Skin:  Wound (see comment) (unstageable PI to sacrum)  Last BM:  unknown  Height:   Ht Readings from Last 1 Encounters:  01/27/2016 5\' 7"  (1.702 m)    Weight:   Wt Readings from Last 1 Encounters:  02/10/2016 133 lb 9.6 oz (60.6 kg)    Ideal Body Weight:  67.2 kg  BMI:  Body mass index is 20.92 kg/m.  Estimated Nutritional Needs:   Kcal:  1502  Protein:  100-125 grams  Fluid:  > 1.5 L/day  EDUCATION NEEDS:   No education needs identified at this  time  Kendell BaneHeather Chancey Ringel RD, LDN, CNSC [phone] Pager [phone] After Hours Pager

## 2016-02-15 NOTE — Progress Notes (Signed)
eLink Physician-Brief Progress Note Patient Name: Anthony CanavanHarold J Werner DOB: 07/28/1924 MRN: [id number]   Date of Service  02/11/2016  HPI/Events of Note  Hypotension - BP = 87/48. LVEF = 30%. Currently on a Fentanyl IV infusion for sedation on mechanical ventilation.  Na+ = 136.   eICU Interventions  Will order: 1. 0.9 NaCl 500 mL IV over 30 minutes now. 2. Monitor CVP. 3. 0.9 NaCl to run IV at 50 mL/hour.      Intervention Category Major Interventions: Hypotension - evaluation and management  Sommer,Steven Eugene 01/23/2016, 2:55 AM

## 2016-02-15 NOTE — Progress Notes (Signed)
ANTICOAGULATION CONSULT NOTE - Follow Up Consult  Pharmacy Consult for heparin Indication:  afib  Allergies  Allergen Reactions  . Atenolol   . Diltiazem   . Hydrochlorothiazide   . Labetalol   . Lovastatin   . Niacin   . Simvastatin   . Terazosin   . Iron     Just had constipation   Patient Measurements: Height: 5\' 7"  (170.2 cm) Weight: 133 lb 9.6 oz (60.6 kg) IBW/kg (Calculated) : 66.1  Vital Signs: Temp: 98.7 F (37.1 C) (12/07 0745) Temp Source: Oral (12/07 0745) BP: 92/57 (12/07 0845) Pulse Rate: 74 (12/07 0845)  Labs:  Recent Labs  01/27/2016 2233 01/19/16 0419 01/19/16 0754 01/19/16 1125 01/19/16 1816 02/09/2016 0401 01/25/2016 2200 February 15, 2016 0500  HGB  --  9.9*  --   --   --  9.8* 9.9* 9.1*  HCT  --  30.6*  --   --   --  30.0* 30.5* 28.4*  PLT  --  194  --   --   --  214 222 203  LABPROT  --  17.6*  --   --   --   --   --   --   INR  --  1.43  --   --   --   --   --   --   HEPARINUNFRC  --   --  0.20*  --  0.18* 0.31  --   --   CREATININE  --  2.18*  --  2.26*  --  2.31*  --  2.81*  TROPONINI 1.25* 1.01*  --   --   --  1.47*  --   --     Estimated Creatinine Clearance: 15.3 mL/min (by C-G formula based on SCr of 2.81 mg/dL (H)).  Assessment: Patient s/p cath on 12/6, severe CAD with total occlusion of mid cfx. DES placed and patient loaded with clopidogrel. Tirofiban for 18 hours to end 12/7 at 930 am.  Now pt with new onset afib and pharmacy consulted to dose heparin.  He was therapeutic precath on 1150 units/hr. Wt 60.6 kg, CBC stable, no bleeding reported.   Goal of Therapy: Heparin level 0.3 - 0.7 Monitor platelets by anticoagulation protocol: Yes   Plan:  Start heparin after aggrastat completed. Drip is off now per RN.  heparin 3000 units bolus, heparin drip at 1150 units/hr Check heparin level in 8 hrs  Daily Heparin level and CBC while on heparin  Herby AbrahamMichelle T. Kiernan Atkerson, Pharm.D. [phone] 02/05/2016 9:52 AM

## 2016-02-15 NOTE — Progress Notes (Addendum)
Patient Name: Jac CanavanHarold J Shimer Date of Encounter: 01/17/2016  [facility]  Hospital Problem List     Principal Problem:   NSTEMI (non-ST elevated myocardial infarction) [facility](HCC) Active Problems:   CHRONIC KIDNEY DISEASE STAGE III (MODERATE)   Essential hypertension   Hyperlipidemia   Carotid artery disease (HCC)   Mild cognitive impairment   Pressure injury of skin     Subjective   Intubated. Moderately sedated. Awakes to voice. Hypotensive. In AFib with controlled rate since last evening. ECG with lateral ST depression. Maintaining urine output, approx 1L yesterday. Fluid overloaded, CVP13-15. Requiring 70%FiO2, reasonably low minute ventilation. CXR looks improved. Creatinine higher 2.8.  Inpatient Medications    Scheduled Meds: . aspirin  81 mg Oral Daily  . chlorhexidine  15 mL Mouth Rinse BID  . Chlorhexidine Gluconate Cloth  6 each Topical Daily  . clopidogrel  75 mg Oral Daily  . heparin  5,000 Units Subcutaneous Q8H  . mouth rinse  15 mL Mouth Rinse q12n4p  . metoprolol tartrate  12.5 mg Oral BID  . mupirocin ointment  1 application Nasal BID  . pantoprazole  40 mg Oral Daily  . pravastatin  40 mg Oral q1800  . sodium chloride flush  3 mL Intravenous Q12H   Continuous Infusions: . sodium chloride 50 mL/hr at 02/05/2016 0400  . fentaNYL infusion INTRAVENOUS Stopped (01/16/2016 0733)  . nitroGLYCERIN Stopped (02/10/2016 0703)  . tirofiban 0.075 mcg/kg/min (01/30/2016 0800)   PRN Meds: sodium chloride, acetaminophen, fentaNYL (SUBLIMAZE) injection, midazolam, morphine injection, ondansetron (ZOFRAN) IV, sodium chloride flush   Vital Signs    Vitals:   02/10/2016 0500 02/12/2016 0600 02/02/2016 0700 01/30/2016 0745  BP: 93/65 (!) 87/52 (!) 89/51 97/60  Pulse: 74 80 80 83  Resp: (!) 8 13 11 12   Temp:      TempSrc:      SpO2: 96% 100% 100% 100%  Weight:  133 lb 9.6 oz (60.6 kg)    Height:        Intake/Output Summary (Last 24 hours) at 01/30/2016  0832 Last data filed at 02/05/2016 0800  Gross per 24 hour  Intake          1670.12 ml  Output             1085 ml  Net           585.12 ml   Filed Weights   April 30, 2015 2330 01/19/16 1756 02/12/2016 0600  Weight: 132 lb 4.4 oz (60 kg) 142 lb (64.4 kg) 133 lb 9.6 oz (60.6 kg)    Physical Exam  Moderately sedated. GEN: Well nourished, well developed, in no acute distress.  HEENT: Grossly normal.  Neck: Supple, no JVD, carotid bruits, or masses. Cardiac: irregular, barely audible systolic murmurs, rubs, or gallops. No clubbing, cyanosis, edema.  Radials/DP/PT 2+ and equal bilaterally.  Respiratory:  Respirations regular and unlabored, clear to auscultation bilaterally. GI: Soft, nontender, nondistended, BS + x 4. MS: no deformity or atrophy. Skin: warm and dry, no rash. Neuro:  Strength and sensation are intact. Psych: AAOx3.  Normal affect.  Labs    CBC  Recent Labs  01/31/2016 2200 01/17/2016 0500  WBC 12.0* 9.7  HGB 9.9* 9.1*  HCT 30.5* 28.4*  MCV 93.8 95.3  PLT 222 203   Basic Metabolic Panel  Recent Labs  01/25/2016 0401 01/30/2016 0500  NA 136 136  K 4.2 4.7  CL 103 104  CO2 20* 19*  GLUCOSE 140* 113*  BUN  67* 80*  CREATININE 2.31* 2.81*  CALCIUM 8.5* 7.9*   Liver Function Tests No results for input(s): AST, ALT, ALKPHOS, BILITOT, PROT, ALBUMIN in the last 72 hours. No results for input(s): LIPASE, AMYLASE in the last 72 hours. Cardiac Enzymes  Recent Labs  02/13/2016 2233 01/19/16 0419 01/15/2016 0401  TROPONINI 1.25* 1.01* 1.47*   BNP Invalid input(s): POCBNP D-Dimer  Recent Labs  01/19/16 2316  DDIMER 0.57*    Telemetry    AFib since about 2100h on 12/6 - Personally Reviewed  ECG    atrial fibrillation, downsloping ST depression V4-V6 - Personally Reviewed  Radiology    Dg Chest Port 1 View  Result Date: 01-25-2016 CLINICAL DATA:  Respiratory failure EXAM: PORTABLE CHEST 1 VIEW COMPARISON:  Yesterday FINDINGS: Endotracheal tube and NG tube  are stable. Extensive diffuse bilateral airspace disease has improved. Bilateral pleural effusions are suspected. Normal heart size. No pneumothorax. IMPRESSION: Improving diffuse bilateral airspace disease. Electronically Signed   By: Jolaine Click M.D.   On: 01-25-2016 08:29   Dg Chest Port 1 View  Result Date: 02/13/2016 CLINICAL DATA:  Intubated.  Respiratory support. EXAM: PORTABLE CHEST 1 VIEW COMPARISON:  01/19/2016 FINDINGS: Endotracheal tube has its tip 3 cm above the carina. Nasogastric tube enters the abdomen. There is worsening of diffuse bilateral airspace density most consistent with worsened diffuse bronchopneumonia. The may be a small amount of accumulating pleural fluid. Aortic atherosclerosis again demonstrated. IMPRESSION: Endotracheal tube and nasogastric tube well positioned. Continued radiographic worsening of diffuse airspace filling most consistent with diffuse pneumonia. Electronically Signed   By: Paulina Fusi M.D.   On: 02/11/2016 10:27   Dg Chest Port 1 View  Result Date: 01/19/2016 CLINICAL DATA:  Dyspnea EXAM: PORTABLE CHEST 1 VIEW COMPARISON:  02/14/2016 FINDINGS: Worsened vascular and interstitial congestive changes. Worsened central and basilar airspace opacities bilaterally. Probable pleural effusions, mildly enlarged from 02/04/2016. Unchanged cardiomegaly. IMPRESSION: The findings likely represent worsening congestive heart failure with interstitial and alveolar edema. Electronically Signed   By: Ellery Plunk M.D.   On: 01/19/2016 23:24    Cardiac Studies   Diagnostic Diagram     Post-Intervention Diagram     Implants     Permanent Stent  Stent Resolute Onyx 3.5x15 - [id number] - Implanted    Inventory item: Stent Resolute Onyx 3.5x15 Model/Cat number: [id number]  Manufacturer: [employer] Lot number: [id number]  Device identifier: [id number] Device identifier type: GS1       Patient Profile     83 yo man with carotid  disease, HTN and moderate CKD (baseline creat 1.8), presented with unstable angina/small NSTEMI and moderately reduced LVEF due to high grade LCX stenosis, developed pulmonary edema following IV fluids for pre-cath hydration and required intubation. S/P PCI-DES to LCX on 12/6 has new onset atrial fibrillation, hypotension and worsening renal function. Also has valvular heart disease: probably moderate AS and at least moderate to severe MR.  Assessment & Plan    1. Cardiogenic shock: has evidence of elevated R and L heart pressures. Reluctant to escalate and use pressors as this may cause RVR. Will hold beta blocker.  2. Acute respiratory failure/pulmonary edema: on vent, unlikely to progress with weaning today, but CXR improving. 3. Acute on chronic renal failure:  At this point his worsening renal parameters are likely due to poor hemodynamics, but he is at very high risk for contrast nephropathy and progression to complete renal failure. If he becomes oliguric, I do not think he will survive.  He is a poor candidate for renal replacement therapy, even for CVVH. Ask Nephrology for advice on management of fluids/diuretics. 4. AFib:  Well rate controlled on a very low dose of metoprolol. Will hold the beta blocker due to hypotension/shock. Can use amiodarone if needed for rate control. Start IV heparin. 5. CAD s/p NSTEMI s/p LCX-DES: on DAPT. Aggrastat infusion almost complete. 6. MR: difficult to say if this has ischemic mechanism due to LCX wall motion abnormality or is due to his overall cardiomyopathy (mildly low EF precedes the CAD diagnosis). Hopefully will see improvement with revascularization.  7. AS: reviewed echo and cath data. I think it is very unlikely that he has severe AS. Mean AV gradient was 4 mm Hg at cath, 10 mm Hg by echo, which is very low even allowing for the low cardiac output.  Overall prognosis is poor and will become grim if there is further deterioration in renal function. Will  consult palliative care early on.  Signed, Thurmon FairMihai Karnell Vanderloop, MD  01/15/2016, 8:32 AM  Total time spent evaluating and treating the patient 57 minutes.   Addendum: spontaneously returned to NSR during my evaluation. 8:54 AM

## 2016-02-15 DEATH — deceased

## 2016-02-17 ENCOUNTER — Ambulatory Visit: Payer: Medicare Other | Admitting: Podiatry

## 2016-05-06 ENCOUNTER — Encounter: Payer: Medicare Other | Admitting: Internal Medicine

## 2016-11-16 ENCOUNTER — Ambulatory Visit: Payer: Medicare Other | Admitting: Family

## 2016-11-16 ENCOUNTER — Encounter (HOSPITAL_COMMUNITY): Payer: Medicare Other

## 2017-05-16 IMAGING — CR DG CHEST 1V PORT
1 series · 1 of 1 positions shown · non-contrast
Comparison: 01/18/2016

CLINICAL DATA: Dyspnea

EXAM:
PORTABLE CHEST 1 VIEW

[AP]
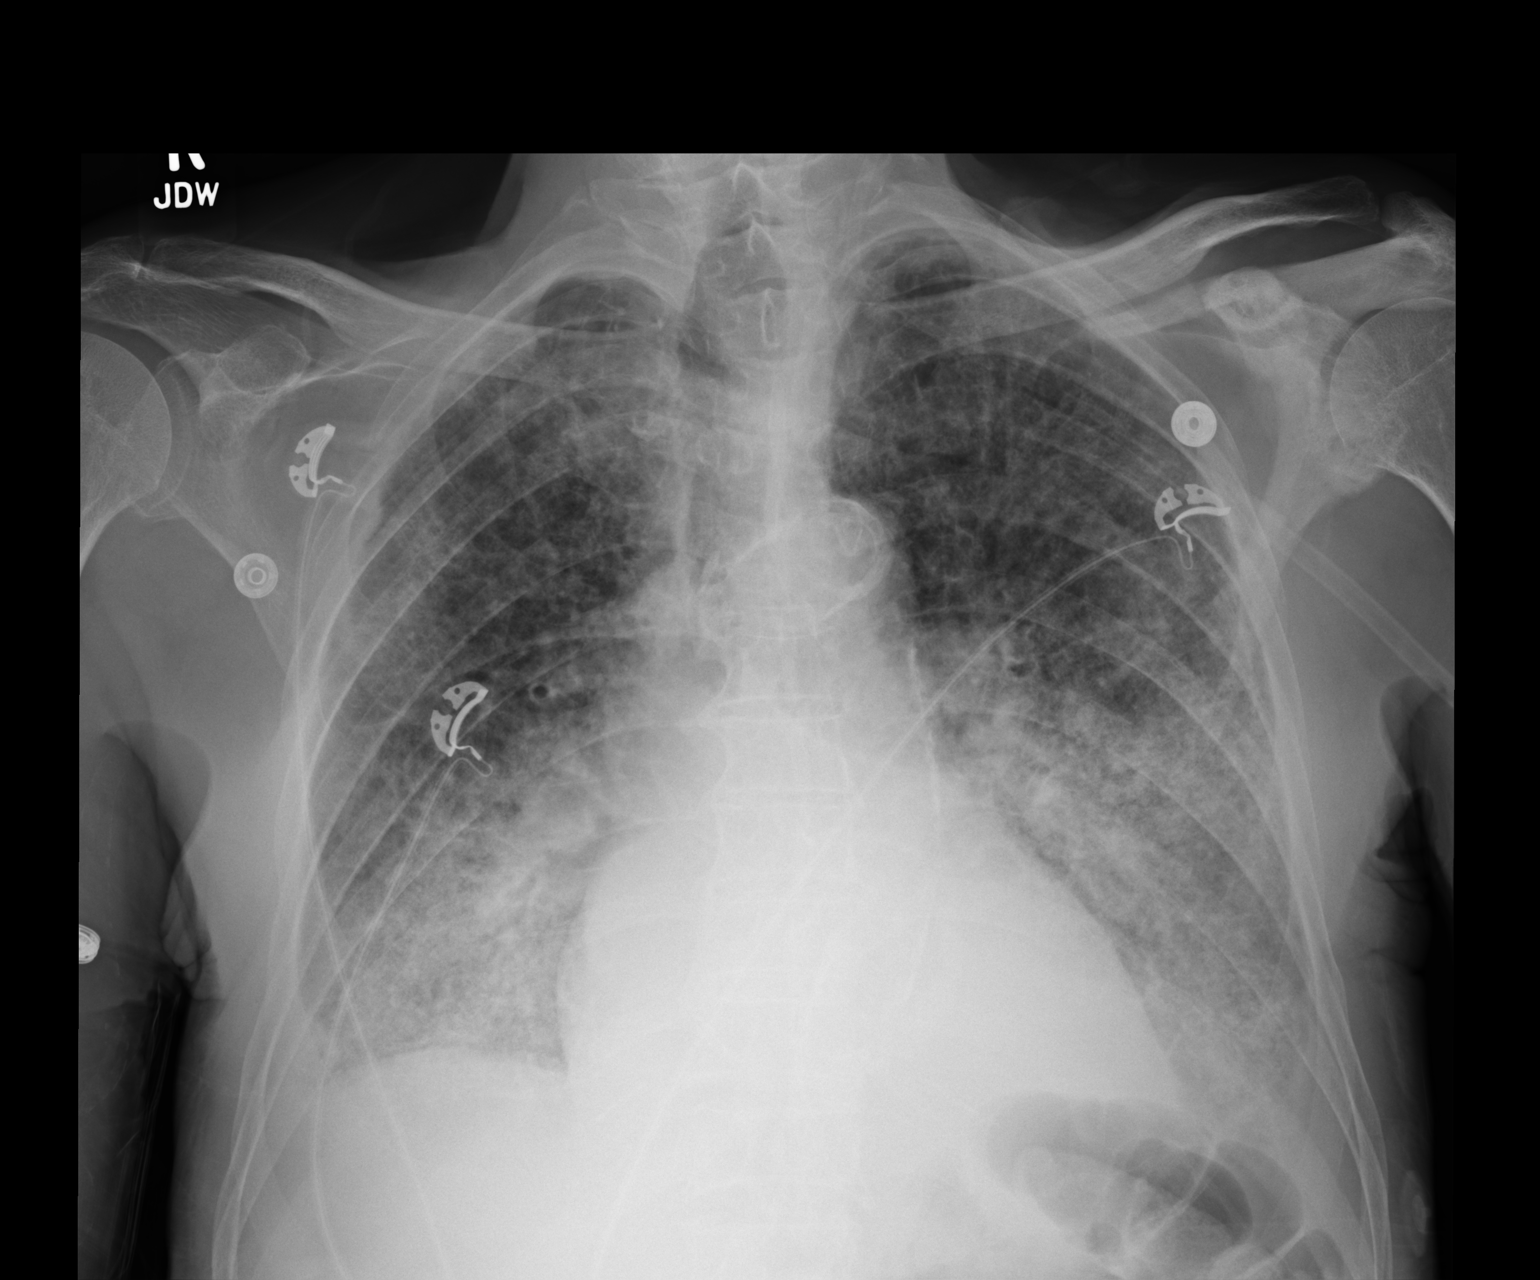

[1 of 1 positions shown; findings below may reference images not displayed]

FINDINGS: Worsened vascular and interstitial congestive changes. Worsened
central and basilar airspace opacities bilaterally. Probable pleural
effusions, mildly enlarged from 01/18/2016. Unchanged cardiomegaly.
IMPRESSION: The findings likely represent worsening congestive heart failure
with interstitial and alveolar edema.

## 2017-05-17 IMAGING — CR DG CHEST 1V PORT
1 series · 1 of 1 positions shown · non-contrast
Comparison: 01/19/2016

CLINICAL DATA: Intubated.  Respiratory support.

EXAM:
PORTABLE CHEST 1 VIEW

[AP]
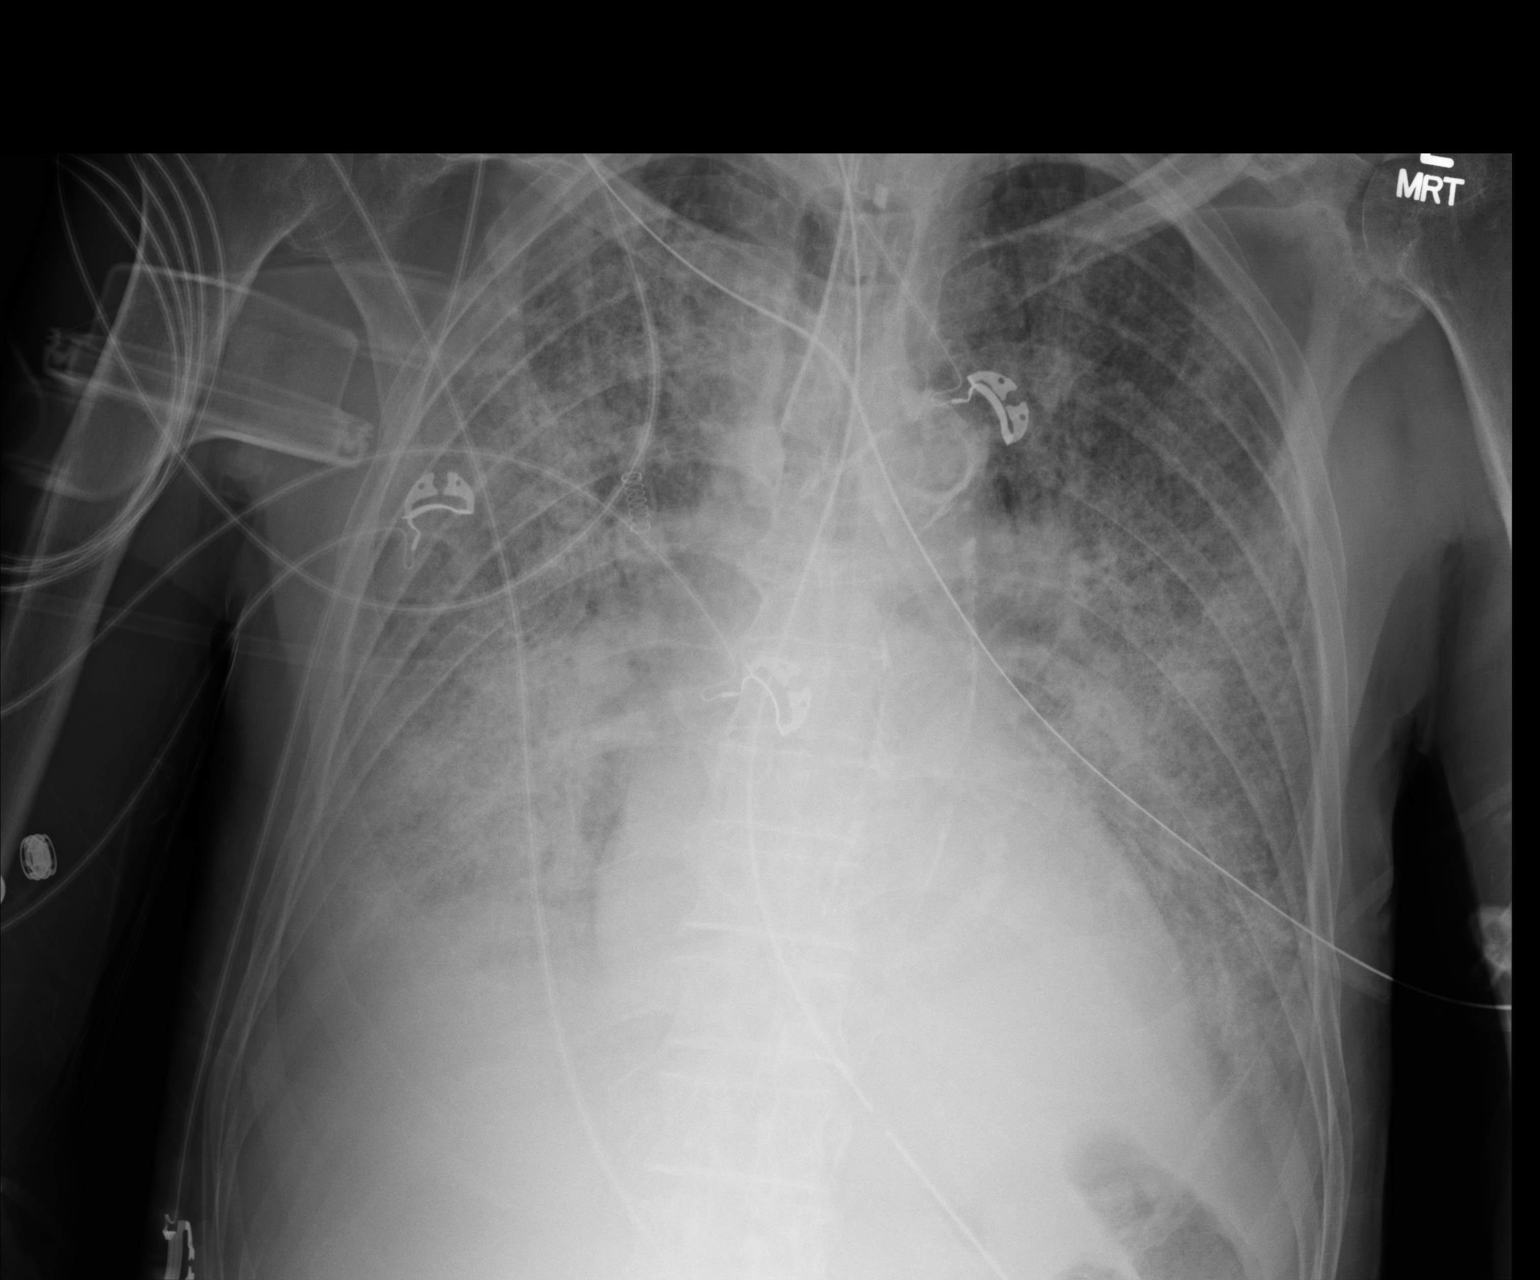

[1 of 1 positions shown; findings below may reference images not displayed]

FINDINGS: Endotracheal tube has its tip 3 cm above the carina. Nasogastric
tube enters the abdomen. There is worsening of diffuse bilateral
airspace density most consistent with worsened diffuse
bronchopneumonia. [REDACTED] be a small amount of accumulating pleural
fluid. Aortic atherosclerosis again demonstrated.
IMPRESSION: Endotracheal tube and nasogastric tube well positioned. Continued
radiographic worsening of diffuse airspace filling most consistent
with diffuse pneumonia.

## 2017-05-18 IMAGING — CR DG CHEST 1V PORT
2 series · 2 of 2 positions shown · non-contrast
Comparison: 01/21/2016

CLINICAL DATA: Shock

EXAM:
PORTABLE CHEST 1 VIEW

[AP (1 of 2)]
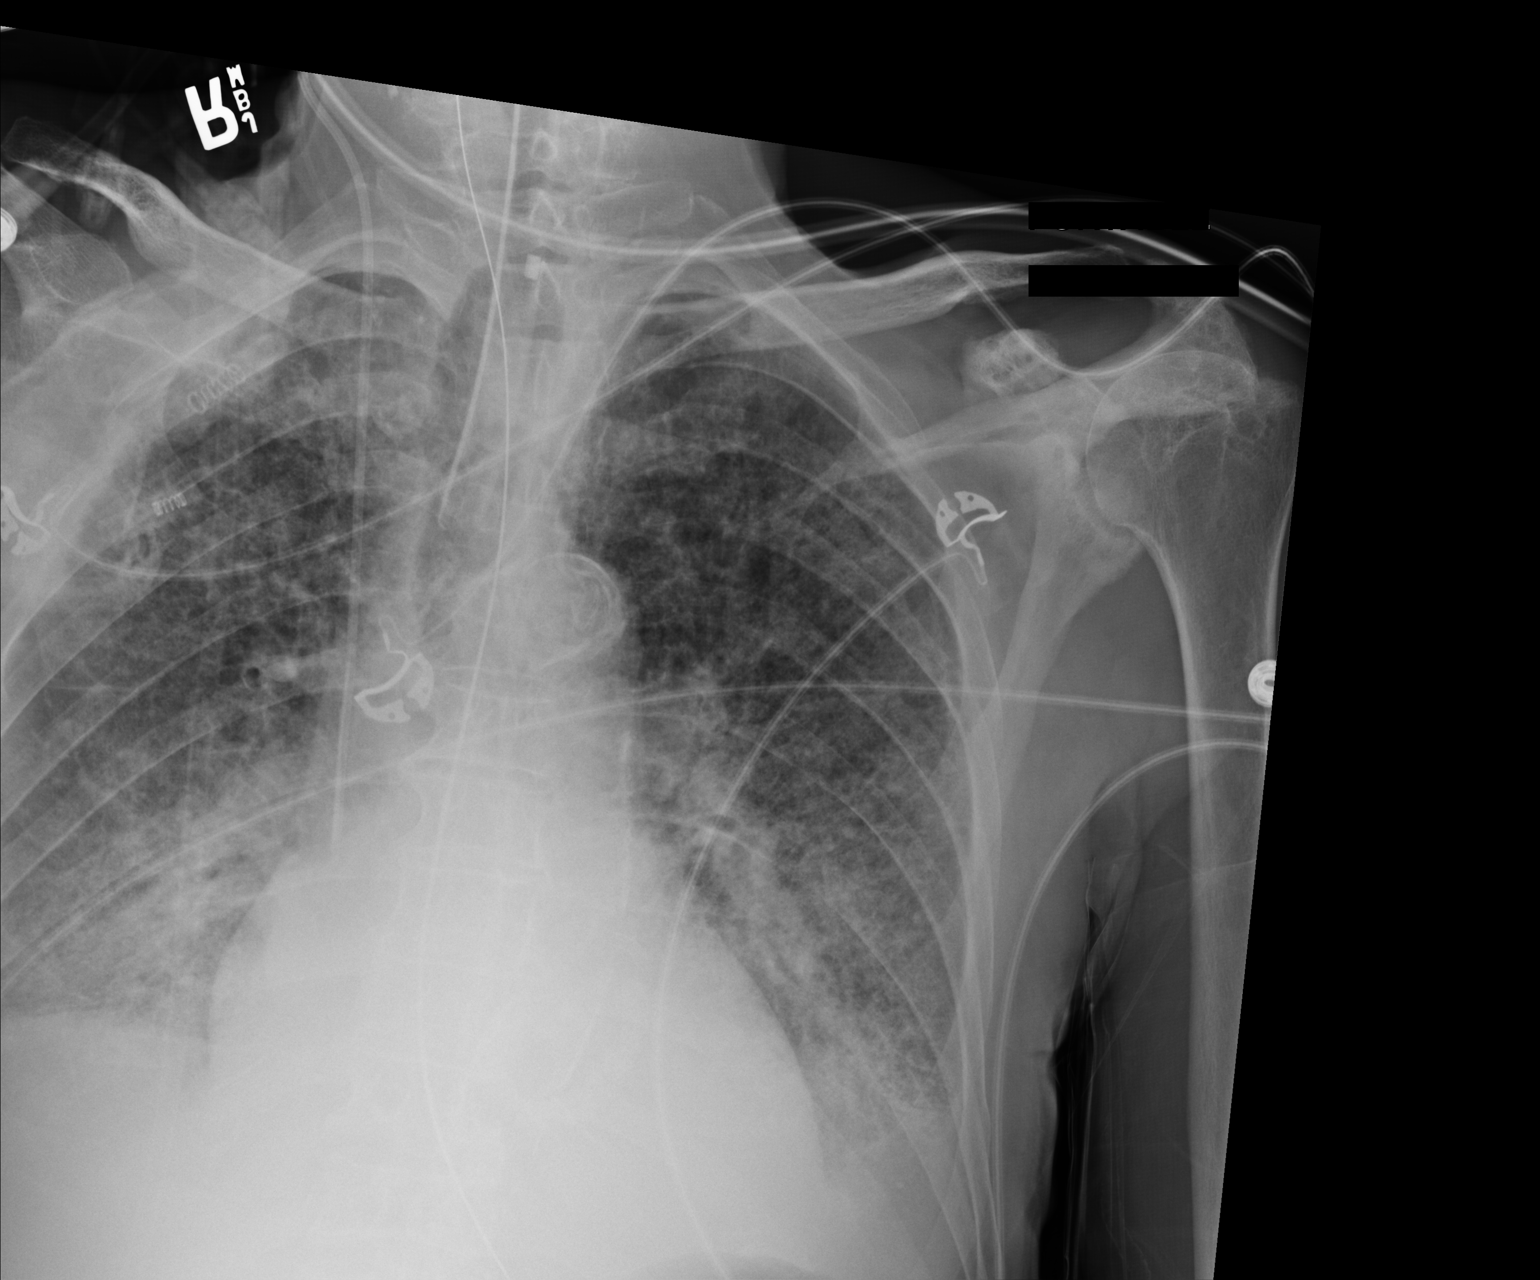

[AP (2 of 2)]
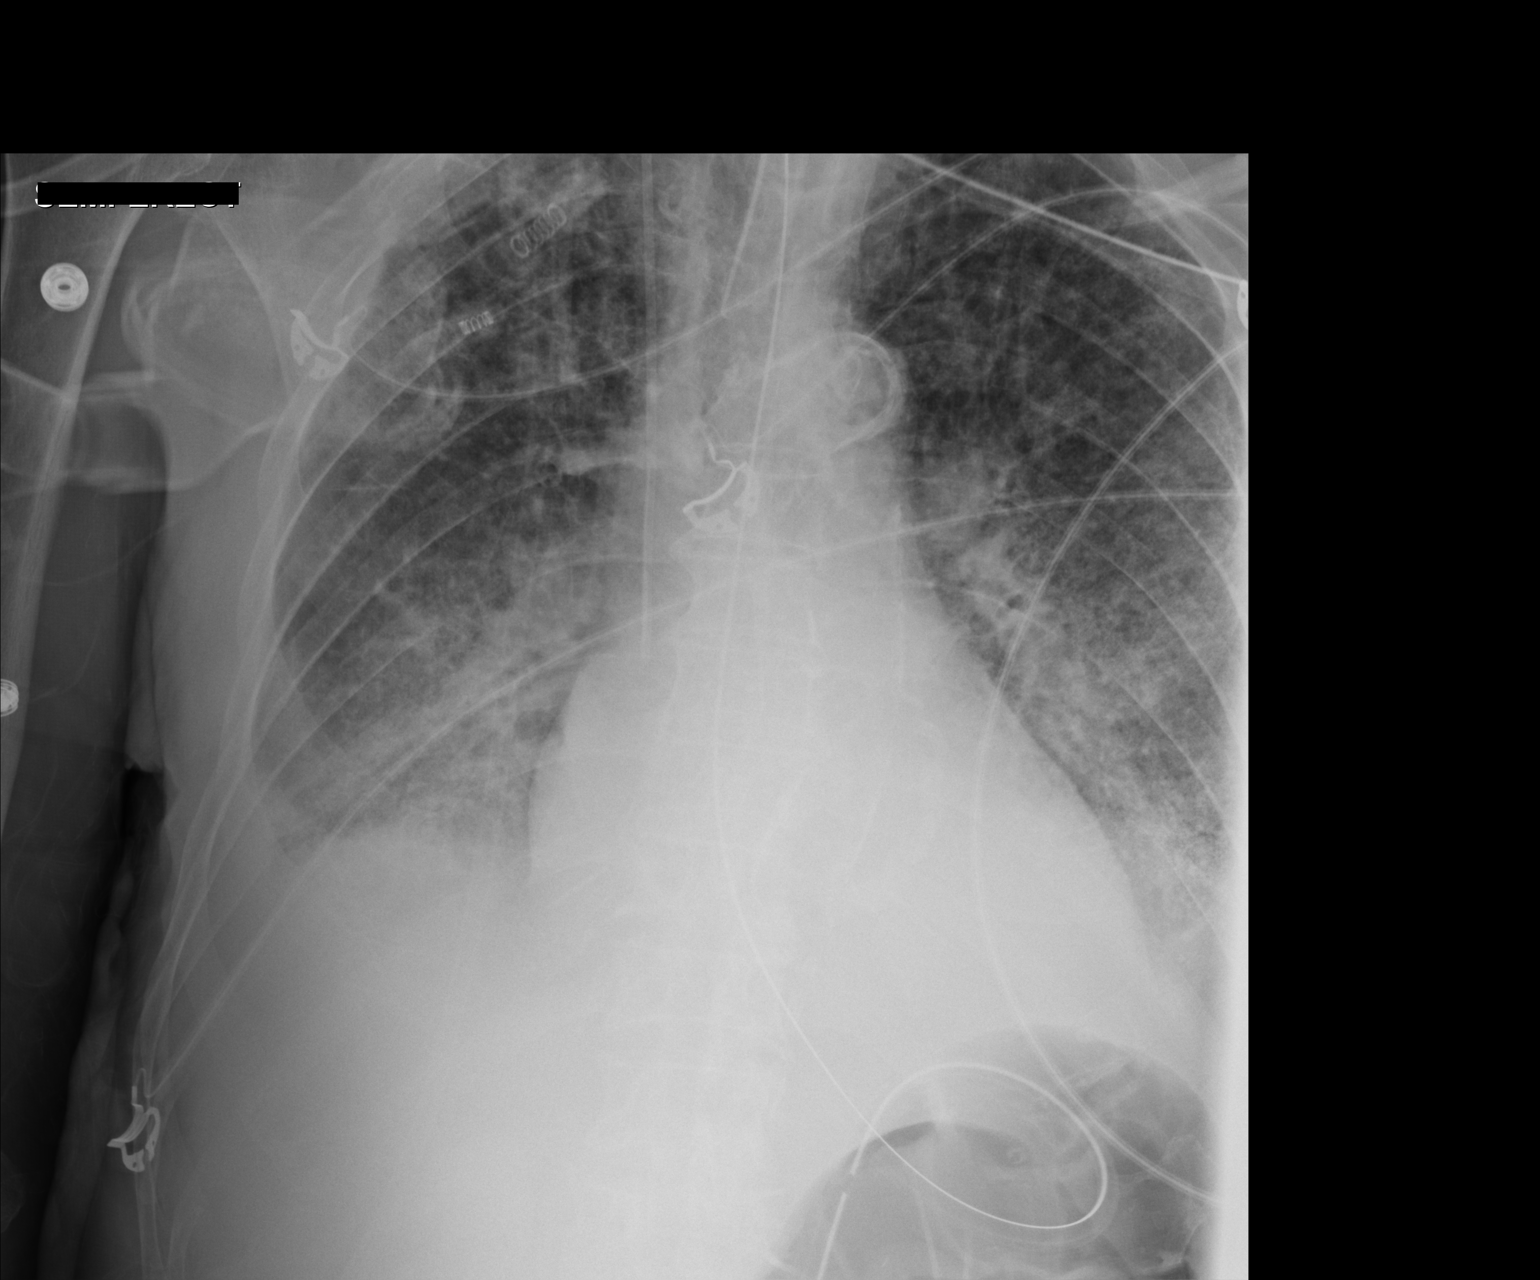

[2 of 2 positions shown; findings below may reference images not displayed]

FINDINGS: Endotracheal tube and NG tube remain in place, unchanged. Interval
placement of right central line with tip at the cavoatrial junction.
No pneumothorax. Diffuse interstitial and alveolar opacities within
the lungs, likely edema/ CHF. Small bilateral effusions.
IMPRESSION: Continued bilateral interstitial and airspace opacities, likely
edema/ CHF. Small bilateral effusions.

Right central line tip at the cavoatrial junction.  No pneumothorax.

## 2017-05-18 IMAGING — CR DG CHEST 1V PORT
1 series · 1 of 1 positions shown · non-contrast
Comparison: Yesterday

CLINICAL DATA: Respiratory failure

EXAM:
PORTABLE CHEST 1 VIEW

[AP]
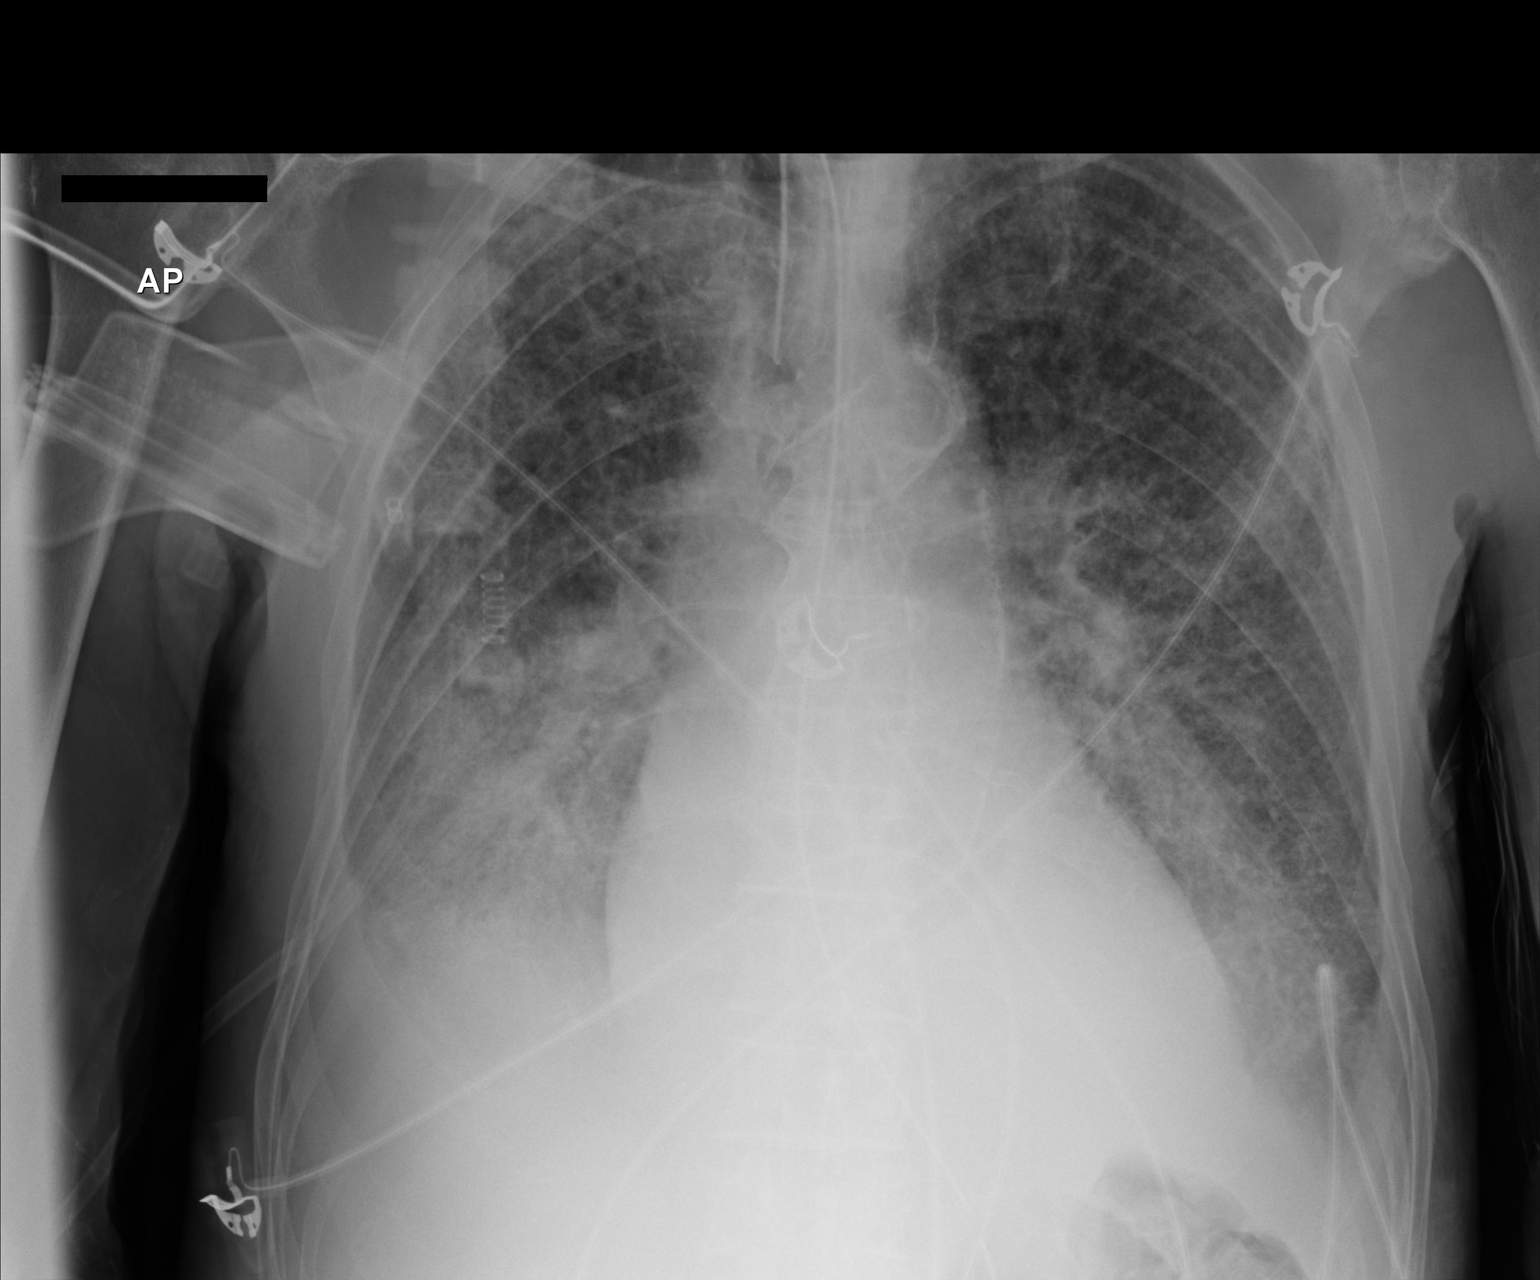

[1 of 1 positions shown; findings below may reference images not displayed]

FINDINGS: Endotracheal tube and NG tube are stable. Extensive diffuse
bilateral airspace disease has improved. Bilateral pleural effusions
are suspected. Normal heart size. No pneumothorax.
IMPRESSION: Improving diffuse bilateral airspace disease.
